# Patient Record
Sex: Female | Born: 1940 | Race: White | Hispanic: No | Smoking: Former smoker
Health system: Southern US, Community
[De-identification: ages and names within clinical notes are randomized; demographics above are authoritative.]

## PROBLEM LIST (undated history)

## (undated) DIAGNOSIS — I10 Essential (primary) hypertension: Secondary | ICD-10-CM

## (undated) DIAGNOSIS — F419 Anxiety disorder, unspecified: Secondary | ICD-10-CM

## (undated) DIAGNOSIS — A159 Respiratory tuberculosis unspecified: Secondary | ICD-10-CM

## (undated) DIAGNOSIS — Z9889 Other specified postprocedural states: Secondary | ICD-10-CM

## (undated) DIAGNOSIS — R112 Nausea with vomiting, unspecified: Secondary | ICD-10-CM

## (undated) DIAGNOSIS — J449 Chronic obstructive pulmonary disease, unspecified: Secondary | ICD-10-CM

## (undated) DIAGNOSIS — F329 Major depressive disorder, single episode, unspecified: Secondary | ICD-10-CM

## (undated) DIAGNOSIS — F32A Depression, unspecified: Secondary | ICD-10-CM

## (undated) DIAGNOSIS — R0600 Dyspnea, unspecified: Secondary | ICD-10-CM

## (undated) DIAGNOSIS — C801 Malignant (primary) neoplasm, unspecified: Secondary | ICD-10-CM

## (undated) DIAGNOSIS — E119 Type 2 diabetes mellitus without complications: Secondary | ICD-10-CM

## (undated) HISTORY — DX: Essential (primary) hypertension: I10

## (undated) HISTORY — DX: Type 2 diabetes mellitus without complications: E11.9

## (undated) HISTORY — DX: Depression, unspecified: F32.A

## (undated) HISTORY — DX: Anxiety disorder, unspecified: F41.9

## (undated) HISTORY — DX: Major depressive disorder, single episode, unspecified: F32.9

## (undated) HISTORY — PX: BREAST SURGERY: SHX581

---

## 2012-08-26 ENCOUNTER — Other Ambulatory Visit: Payer: Self-pay | Admitting: Family Medicine

## 2012-09-08 ENCOUNTER — Ambulatory Visit
Admission: RE | Admit: 2012-09-08 | Discharge: 2012-09-08 | Disposition: A | Discharge status: Home / Self Care | Payer: Medicare HMO | Source: Ambulatory Visit | Attending: Family Medicine | Admitting: Family Medicine

## 2012-09-09 ENCOUNTER — Other Ambulatory Visit: Payer: Self-pay | Admitting: Family Medicine

## 2013-09-09 ENCOUNTER — Other Ambulatory Visit: Payer: Self-pay | Admitting: Family Medicine

## 2013-09-09 DIAGNOSIS — E049 Nontoxic goiter, unspecified: Secondary | ICD-10-CM

## 2013-09-24 ENCOUNTER — Ambulatory Visit
Admission: RE | Admit: 2013-09-24 | Discharge: 2013-09-24 | Disposition: A | Discharge status: Home / Self Care | Payer: Commercial Managed Care - HMO | Source: Ambulatory Visit | Attending: Family Medicine | Admitting: Family Medicine

## 2013-09-24 ENCOUNTER — Encounter (INDEPENDENT_AMBULATORY_CARE_PROVIDER_SITE_OTHER): Payer: Self-pay

## 2013-09-24 DIAGNOSIS — E049 Nontoxic goiter, unspecified: Secondary | ICD-10-CM

## 2014-04-19 DIAGNOSIS — E02 Subclinical iodine-deficiency hypothyroidism: Secondary | ICD-10-CM | POA: Diagnosis not present

## 2014-04-19 DIAGNOSIS — Z79899 Other long term (current) drug therapy: Secondary | ICD-10-CM | POA: Diagnosis not present

## 2014-04-19 DIAGNOSIS — E785 Hyperlipidemia, unspecified: Secondary | ICD-10-CM | POA: Diagnosis not present

## 2014-04-19 DIAGNOSIS — I10 Essential (primary) hypertension: Secondary | ICD-10-CM | POA: Diagnosis not present

## 2014-04-19 DIAGNOSIS — N183 Chronic kidney disease, stage 3 (moderate): Secondary | ICD-10-CM | POA: Diagnosis not present

## 2014-04-19 DIAGNOSIS — F39 Unspecified mood [affective] disorder: Secondary | ICD-10-CM | POA: Diagnosis not present

## 2014-04-19 DIAGNOSIS — E1122 Type 2 diabetes mellitus with diabetic chronic kidney disease: Secondary | ICD-10-CM | POA: Diagnosis not present

## 2014-05-12 DIAGNOSIS — G2401 Drug induced subacute dyskinesia: Secondary | ICD-10-CM | POA: Diagnosis not present

## 2014-05-12 DIAGNOSIS — F39 Unspecified mood [affective] disorder: Secondary | ICD-10-CM | POA: Diagnosis not present

## 2014-12-01 DIAGNOSIS — I10 Essential (primary) hypertension: Secondary | ICD-10-CM | POA: Diagnosis not present

## 2014-12-01 DIAGNOSIS — N183 Chronic kidney disease, stage 3 (moderate): Secondary | ICD-10-CM | POA: Diagnosis not present

## 2014-12-01 DIAGNOSIS — K7581 Nonalcoholic steatohepatitis (NASH): Secondary | ICD-10-CM | POA: Diagnosis not present

## 2014-12-01 DIAGNOSIS — D72829 Elevated white blood cell count, unspecified: Secondary | ICD-10-CM | POA: Diagnosis not present

## 2014-12-01 DIAGNOSIS — Z0000 Encounter for general adult medical examination without abnormal findings: Secondary | ICD-10-CM | POA: Diagnosis not present

## 2014-12-01 DIAGNOSIS — E1122 Type 2 diabetes mellitus with diabetic chronic kidney disease: Secondary | ICD-10-CM | POA: Diagnosis not present

## 2014-12-01 DIAGNOSIS — E559 Vitamin D deficiency, unspecified: Secondary | ICD-10-CM | POA: Diagnosis not present

## 2014-12-01 DIAGNOSIS — E785 Hyperlipidemia, unspecified: Secondary | ICD-10-CM | POA: Diagnosis not present

## 2015-02-16 DIAGNOSIS — E1122 Type 2 diabetes mellitus with diabetic chronic kidney disease: Secondary | ICD-10-CM | POA: Diagnosis not present

## 2015-02-16 DIAGNOSIS — E785 Hyperlipidemia, unspecified: Secondary | ICD-10-CM | POA: Diagnosis not present

## 2015-02-16 DIAGNOSIS — R718 Other abnormality of red blood cells: Secondary | ICD-10-CM | POA: Diagnosis not present

## 2015-02-16 DIAGNOSIS — N181 Chronic kidney disease, stage 1: Secondary | ICD-10-CM | POA: Diagnosis not present

## 2015-02-16 DIAGNOSIS — D72829 Elevated white blood cell count, unspecified: Secondary | ICD-10-CM | POA: Diagnosis not present

## 2015-05-19 DIAGNOSIS — G629 Polyneuropathy, unspecified: Secondary | ICD-10-CM | POA: Diagnosis not present

## 2015-05-19 DIAGNOSIS — I10 Essential (primary) hypertension: Secondary | ICD-10-CM | POA: Diagnosis not present

## 2015-05-19 DIAGNOSIS — E1122 Type 2 diabetes mellitus with diabetic chronic kidney disease: Secondary | ICD-10-CM | POA: Diagnosis not present

## 2015-05-19 DIAGNOSIS — R202 Paresthesia of skin: Secondary | ICD-10-CM | POA: Diagnosis not present

## 2015-05-19 DIAGNOSIS — F39 Unspecified mood [affective] disorder: Secondary | ICD-10-CM | POA: Diagnosis not present

## 2015-05-19 DIAGNOSIS — D72829 Elevated white blood cell count, unspecified: Secondary | ICD-10-CM | POA: Diagnosis not present

## 2015-05-24 ENCOUNTER — Telehealth: Payer: Self-pay | Admitting: Oncology

## 2015-05-24 NOTE — Telephone Encounter (Signed)
Pt decline scheduling an appt at this time. Contacted referring office([facility]) and informed them of pts decision.

## 2015-05-31 DIAGNOSIS — H2513 Age-related nuclear cataract, bilateral: Secondary | ICD-10-CM | POA: Diagnosis not present

## 2015-05-31 DIAGNOSIS — H40033 Anatomical narrow angle, bilateral: Secondary | ICD-10-CM | POA: Diagnosis not present

## 2015-06-08 ENCOUNTER — Other Ambulatory Visit: Payer: Self-pay

## 2015-06-08 NOTE — Patient Outreach (Signed)
[place] [facility]) Care Management  06/08/2015  SIMORA MONTEON Oct 30, 1940 [id number]    RN CM placed phone call to Dr. Myer Peer office. Spoke with Rometta Emery. Advised her that RN CM had reached out to patient to offer [facility] services but patient had adamantly declined. Staff will make MD aware.   Plan: RN CM will close case at this time.  Enzo Montgomery, RN,BSN,CCM [place] Management Telephonic Care Management Coordinator Direct Phone: [phone] Toll Free: [phone] Fax: [phone]

## 2015-06-08 NOTE — Patient Outreach (Signed)
[place] [facility]) Care Management  06/08/2015  Laurie Collins [birth year] [id number]   Telephone Screen  Referral Date: 06/08/15 Referral Source: MD office(Dr. Stephanie Acre) Referral Reason: "COPD,DM, elevated WBC count, lymphoma(possible)"   Outreach attempt #1 to patient. Patient reached. Discussed MD referral and [facility] services. Patients states "I don't know why she(MD) placed a referral for me, I don't need any help." Discussed with patient how she might benefit from [facility] services but she was adamant that she did not need services. RN CM questioned patient in regards to her recent lab work. She states that she is not concerned about elevated WBC count. She reports she had a family relative who had elevated WBC and it "turned out to be nothing." She is not interested in seeing specialist at this time. Patient states she has "specialist appointment on 07/25/15." When patient questioned in regards to whom appt was with she reported Dr. Delrae Rend. Advised patient that this was a diabetes specialist and that he would not be the one handling her WBC count matter. She then stated that she was fine with "waiting until later" to deal with the matter. Patient repeated several times in conversation that she did not need nor was she interested in [facility] services. She did agree to [employer] CM mailing out [facility] brochure and contact info for future reference.   Plan: RN CM will notify [facility] administrative assistant of case closure. RN CM will contact MD via phone and letter to advise of patient refusal. RN CM will send [facility] brochure and contact info for patient to refer to in the future.  Enzo Montgomery, RN,BSN,CCM [place] Management Telephonic Care Management Coordinator Direct Phone: [phone] Toll Free: [phone] Fax: [phone]

## 2015-06-19 ENCOUNTER — Telehealth: Payer: Self-pay | Admitting: Oncology

## 2015-06-19 ENCOUNTER — Encounter: Payer: Self-pay | Admitting: Oncology

## 2015-06-19 NOTE — Telephone Encounter (Signed)
Verified address and insurance, faxed letter to referring provider(Dr. Dannial Monarch), mailed out new pt packet, set up return call and contact person

## 2015-06-27 ENCOUNTER — Encounter: Payer: Self-pay | Admitting: Oncology

## 2015-06-27 ENCOUNTER — Telehealth: Payer: Self-pay | Admitting: Oncology

## 2015-06-27 ENCOUNTER — Ambulatory Visit (HOSPITAL_BASED_OUTPATIENT_CLINIC_OR_DEPARTMENT_OTHER): Payer: Commercial Managed Care - HMO

## 2015-06-27 ENCOUNTER — Ambulatory Visit (HOSPITAL_BASED_OUTPATIENT_CLINIC_OR_DEPARTMENT_OTHER): Payer: Commercial Managed Care - HMO | Admitting: Oncology

## 2015-06-27 VITALS — BP 169/69 | HR 99 | Temp 98.6°F | Resp 18 | Ht 63.0 in | Wt 184.9 lb

## 2015-06-27 DIAGNOSIS — D72829 Elevated white blood cell count, unspecified: Secondary | ICD-10-CM

## 2015-06-27 DIAGNOSIS — R718 Other abnormality of red blood cells: Secondary | ICD-10-CM

## 2015-06-27 LAB — CBC WITH DIFFERENTIAL/PLATELET
BASO%: 0.6 % (ref 0.0–2.0)
BASOS ABS: 0.1 10*3/uL (ref 0.0–0.1)
EOS ABS: 0.5 10*3/uL (ref 0.0–0.5)
EOS%: 3.5 % (ref 0.0–7.0)
HCT: 40 % (ref 34.8–46.6)
HGB: 12.6 g/dL (ref 11.6–15.9)
LYMPH%: 30.2 % (ref 14.0–49.7)
MCH: 23.8 pg — AB (ref 25.1–34.0)
MCHC: 31.4 g/dL — AB (ref 31.5–36.0)
MCV: 75.7 fL — AB (ref 79.5–101.0)
MONO#: 1.2 10*3/uL — AB (ref 0.1–0.9)
MONO%: 8.9 % (ref 0.0–14.0)
NEUT#: 7.9 10*3/uL — ABNORMAL HIGH (ref 1.5–6.5)
NEUT%: 56.8 % (ref 38.4–76.8)
PLATELETS: 330 10*3/uL (ref 145–400)
RBC: 5.29 10*6/uL (ref 3.70–5.45)
RDW: 16.9 % — ABNORMAL HIGH (ref 11.2–14.5)
WBC: 14 10*3/uL — AB (ref 3.9–10.3)
lymph#: 4.2 10*3/uL — ABNORMAL HIGH (ref 0.9–3.3)

## 2015-06-27 LAB — COMPREHENSIVE METABOLIC PANEL
ALT: 50 U/L (ref 0–55)
AST: 44 U/L — ABNORMAL HIGH (ref 5–34)
Albumin: 3.9 g/dL (ref 3.5–5.0)
Alkaline Phosphatase: 92 U/L (ref 40–150)
Anion Gap: 11 mEq/L (ref 3–11)
BILIRUBIN TOTAL: 0.35 mg/dL (ref 0.20–1.20)
BUN: 20.2 mg/dL (ref 7.0–26.0)
CHLORIDE: 104 meq/L (ref 98–109)
CO2: 26 meq/L (ref 22–29)
Calcium: 10 mg/dL (ref 8.4–10.4)
Creatinine: 1.1 mg/dL (ref 0.6–1.1)
EGFR: 50 mL/min/{1.73_m2} — AB (ref >90)
GLUCOSE: 153 mg/dL — AB (ref 70–140)
POTASSIUM: 4.1 meq/L (ref 3.5–5.1)
SODIUM: 140 meq/L (ref 136–145)
TOTAL PROTEIN: 8.7 g/dL — AB (ref 6.4–8.3)

## 2015-06-27 LAB — IRON AND TIBC
%SAT: 11 % — AB (ref 21–57)
Iron: 43 ug/dL (ref 41–142)
TIBC: 386 ug/dL (ref 236–444)
UIBC: 343 ug/dL (ref 120–384)

## 2015-06-27 LAB — FERRITIN: Ferritin: 45 ng/ml (ref 9–269)

## 2015-06-27 NOTE — Progress Notes (Signed)
Please see consult note.  

## 2015-06-27 NOTE — Telephone Encounter (Signed)
per pof to sch pt appt-sent back ot lab

## 2015-06-27 NOTE — Consult Note (Signed)
Reason for Referral:   HPI: 75 year old woman native of [place] and currently lives in this area with her son for the last 4 years. She is a woman with history of mild obesity, hypertension and diabetes. She also has a long-standing smoking history for over 30 years. She did quit however in the last few years. She was noted to have a leukocytosis periodically over the last few years. Her most recent CBC and February 2017 showed a white cell count 13.2 with a normal hemoglobin of 12.2, MCV 76 and platelet count of 320. Her differential was completely normal. Her white cell count in November 2016 was 14.1 with a normal differential. Her white cell count was high as 17.2 in August 2016. Her white cell count was 16,000 back in April 2014. She is completely asymptomatic and does not report any recent infections. She denied any sinusitis, cellulitis or urinary tract infection. She denied any chronic inflammatory conditions but does report osteoarthritis. She does not report any recent illnesses or hospitalizations. She does report a cousin that was diagnosed with myeloproliferative disorder with essential thrombocythemia. She continues to be functional and able to perform all activities of daily living. She does take vitamin D supplements but have not taken any iron in the past. She never had a colonoscopy.  She does not report any headaches, blurry vision, syncope or seizures. He does not report any fevers, chills, sweats or weight loss. Does not report any chest pain, palpitation, orthopnea or leg edema. She does not report cough, wheezing or hemoptysis. She does not report any nausea, vomiting, abdominal pain, hematochezia, melena or change in her bowel habits. She does not report any frequency, urgency or hesitancy. She does not report any skeletal complaints. Remaining review of systems unremarkable.   Past Medical History  Diagnosis Date  . Diabetes mellitus without complication ([place])   .  Hypertension   : Her medication were personally reviewed including vitamin D, amlodipine, hydrochlorothiazide, lisinopril, metoprolol, simvastatin, at [facility].    Allergic to doxycycline and penicillin    family history: Her mother died of gastric cancer but no history of any blood disorders or hematological malignancies.   Social History: She is single and this time. She denied alcohol abuse. She smoked heavily for over 30 years but currently not smoking.     Pertinent items are noted in HPI.  Exam: ECOG 0 Blood pressure 169/69, pulse 99, temperature 98.6 F (37 C), temperature source Oral, resp. rate 18, height 5\' 3"  (1.6 m), weight 184 lb 14.4 oz (83.87 kg), SpO2 99 %. General appearance: alert and cooperative appeared in no active distress. Head: Normocephalic, without obvious abnormality sclerae anicteric. Throat: lips, mucosa, and tongue normal; teeth and gums normal Neck: no adenopathy Back: negative Resp: clear to auscultation bilaterally without wheezes or dullness to percussion. Chest wall: no tenderness Cardio: regular rate and rhythm, S1, S2 normal, no murmur, click, rub or gallop GI: soft, non-tender; bowel sounds normal; no masses,  no organomegaly Extremities: extremities normal, atraumatic, no cyanosis or edema Pulses: 2+ and symmetric Skin: Skin color, texture, turgor normal. No rashes or lesions Lymph nodes: Cervical, supraclavicular, and axillary nodes normal.   Laboratory data reviewed in the history of present illness.    Assessment and Plan:   75 year old woman with the following issues:  1. 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 Her white cell count have fluctuated as high as 17,000 and back to normal range during the  interval of time. Her differential have been completely normal with normal neutrophil percentage, lymphocyte proportions as well as no increased basophilia, eosinophilia or monocytosis. She also have  been told in the past that she had leukocytosis even back when she was living in [place].  The differential diagnosis was discussed today with the patient and her friends accompanied her today. Most likely we are dealing with a reactive leukocytosis of a benign etiology. Given the fluctuating nature of her white cell count, normalization at times as well as lack of any other symptoms diagnosis of a malignant disorder is likely.  Myeloproliferative disorder such as CML, polycythemia, essential thrombocythemia or consideration but considered to be less likely.  Smoking related leukocytosis could be a possibility but she have quit in the last 4 years or so. Leukocytosis related to a malignancy is also unlikely given the fact that she is completely asymptomatic and normalization of her white cell count at times.  From a management standpoint, I will obtain a BCR-ABL to rule out CML and also JAK2 mutation to rule out myeloproliferative disorder. If these testing are negative, I do not recommend any further evaluation and likely we are dealing with a benign process.  If these tests are positive, then further evaluation might be needed.  2. Microcytosis and elevated RDW: Prescription be related to chronic iron deficiency which needs to be evaluated in a postmenopausal woman. I have recommended obtaining iron studies which we will do today. I do recommend screening colonoscopy in any case as she is certainly due for that.  3. Age-appropriate cancer screening: I have recommended a colonoscopy to complete cancer screening.  All her questions were answered and follow-up will be determined by the results of her laboratory testing. I have emphasized importance of obtaining a colonoscopy which she is thinking about and we'll discuss with her primary care provider in their next visit.

## 2015-07-04 ENCOUNTER — Encounter: Payer: Self-pay | Admitting: Oncology

## 2015-07-04 NOTE — Progress Notes (Signed)
The results of her workup will obtain on 06/27/2015 was reviewed today and discussed with [name] over the phone. Her CBC showed a white cell count of 14,000, hemoglobin of 12 and an MCV of 75. Her iron studies showed an iron level of 43 and saturation of 11%. Her ferritin is normal.  Her BCR-ABL screen and JAK 2 mutation showed normal results without any evidence of these findings. This supports the finding of a reactive leukocytosis rather than a myeloproliferative disorder.  I discussed these findings today with the patient and I have recommended oral iron supplements over-the-counter as well as consideration for a colonoscopy given her mild iron deficiency.  No further hematology workup is needed at this time and I will be happy to see her in the future as needed.  Results for Laurie Collins, Laurie Collins (MRN [id number]) as of 07/04/2015 09:47  Ref. Range 06/27/2015 11:09 06/27/2015 11:09  Sodium Latest Ref Range: 136-145 mEq/L  140  Potassium Latest Ref Range: 3.5-5.1 mEq/L  4.1  Chloride Latest Ref Range: 98-109 mEq/L  104  CO2 Latest Ref Range: 22-29 mEq/L  26  BUN Latest Ref Range: 7.0-26.0 mg/dL  20.2  Creatinine Latest Ref Range: 0.6-1.1 mg/dL  1.1  Calcium Latest Ref Range: 8.4-10.4 mg/dL  10.0  Glucose Latest Ref Range: 70-140 mg/dl  153 (H)  Anion gap Latest Ref Range: 3-11 mEq/L  11  EGFR Latest Ref Range: >90 ml/min/1.73 m2  50 (L)  Alkaline Phosphatase Latest Ref Range: 40-150 U/L  92  Albumin Latest Ref Range: 3.5-5.0 g/dL  3.9  AST Latest Ref Range: 5-34 U/L  44 (H)  ALT Latest Ref Range: 0-55 U/L  50  Total Protein Latest Ref Range: 6.4-8.3 g/dL  8.7 (H)  Total Bilirubin Latest Ref Range: 0.20-1.20 mg/dL  0.35  Iron Latest Ref Range: 41-142 ug/dL 43   UIBC Latest Ref Range: 120-384 ug/dL 343   TIBC Latest Ref Range: 236-444 ug/dL 386   %SAT Latest Ref Range: 21-57 % 11 (L)   Ferritin Latest Ref Range: 9-269 ng/ml 45   WBC Latest Ref Range: 3.9-10.3 10e3/uL 14.0 (H)   RBC  Latest Ref Range: 3.70-5.45 10e6/uL 5.29   Hemoglobin Latest Ref Range: 11.6-15.9 g/dL 12.6   HCT Latest Ref Range: 34.8-46.6 % 40.0   MCV Latest Ref Range: 79.5-101.0 fL 75.7 (L)   MCH Latest Ref Range: 25.1-34.0 pg 23.8 (L)   MCHC Latest Ref Range: 31.5-36.0 g/dL 31.4 (L)   RDW Latest Ref Range: 11.2-14.5 % 16.9 (H)   Platelets Latest Ref Range: 145-400 10e3/uL 330   NEUT% Latest Ref Range: 38.4-76.8 % 56.8   LYMPH% Latest Ref Range: 14.0-49.7 % 30.2   MONO% Latest Ref Range: 0.0-14.0 % 8.9   EOS% Latest Ref Range: 0.0-7.0 % 3.5   BASO% Latest Ref Range: 0.0-2.0 % 0.6   NEUT# Latest Ref Range: 1.5-6.5 10e3/uL 7.9 (H)   MONO# Latest Ref Range: 0.1-0.9 10e3/uL 1.2 (H)   Eosinophils Absolute Latest Ref Range: 0.0-0.5 10e3/uL 0.5   Basophils Absolute Latest Ref Range: 0.0-0.1 10e3/uL 0.1   lymph# Latest Ref Range: 0.9-3.3 10e3/uL 4.2 (H)

## 2015-07-25 DIAGNOSIS — E1165 Type 2 diabetes mellitus with hyperglycemia: Secondary | ICD-10-CM | POA: Diagnosis not present

## 2015-07-25 DIAGNOSIS — Z7984 Long term (current) use of oral hypoglycemic drugs: Secondary | ICD-10-CM | POA: Diagnosis not present

## 2015-07-25 DIAGNOSIS — E669 Obesity, unspecified: Secondary | ICD-10-CM | POA: Diagnosis not present

## 2015-07-25 DIAGNOSIS — Z6834 Body mass index (BMI) 34.0-34.9, adult: Secondary | ICD-10-CM | POA: Diagnosis not present

## 2015-10-25 DIAGNOSIS — E119 Type 2 diabetes mellitus without complications: Secondary | ICD-10-CM | POA: Diagnosis not present

## 2015-10-25 DIAGNOSIS — Z7984 Long term (current) use of oral hypoglycemic drugs: Secondary | ICD-10-CM | POA: Diagnosis not present

## 2015-10-25 DIAGNOSIS — E669 Obesity, unspecified: Secondary | ICD-10-CM | POA: Diagnosis not present

## 2015-10-25 DIAGNOSIS — Z6834 Body mass index (BMI) 34.0-34.9, adult: Secondary | ICD-10-CM | POA: Diagnosis not present

## 2015-12-22 DIAGNOSIS — E1165 Type 2 diabetes mellitus with hyperglycemia: Secondary | ICD-10-CM | POA: Diagnosis not present

## 2015-12-22 DIAGNOSIS — E02 Subclinical iodine-deficiency hypothyroidism: Secondary | ICD-10-CM | POA: Diagnosis not present

## 2015-12-22 DIAGNOSIS — N183 Chronic kidney disease, stage 3 (moderate): Secondary | ICD-10-CM | POA: Diagnosis not present

## 2015-12-22 DIAGNOSIS — E785 Hyperlipidemia, unspecified: Secondary | ICD-10-CM | POA: Diagnosis not present

## 2015-12-22 DIAGNOSIS — E559 Vitamin D deficiency, unspecified: Secondary | ICD-10-CM | POA: Diagnosis not present

## 2015-12-22 DIAGNOSIS — Z0000 Encounter for general adult medical examination without abnormal findings: Secondary | ICD-10-CM | POA: Diagnosis not present

## 2015-12-22 DIAGNOSIS — Z79899 Other long term (current) drug therapy: Secondary | ICD-10-CM | POA: Diagnosis not present

## 2015-12-22 DIAGNOSIS — Z7984 Long term (current) use of oral hypoglycemic drugs: Secondary | ICD-10-CM | POA: Diagnosis not present

## 2015-12-22 DIAGNOSIS — D72829 Elevated white blood cell count, unspecified: Secondary | ICD-10-CM | POA: Diagnosis not present

## 2015-12-22 DIAGNOSIS — K7581 Nonalcoholic steatohepatitis (NASH): Secondary | ICD-10-CM | POA: Diagnosis not present

## 2016-08-20 ENCOUNTER — Other Ambulatory Visit: Payer: Self-pay | Admitting: Family Medicine

## 2016-08-20 DIAGNOSIS — N63 Unspecified lump in unspecified breast: Secondary | ICD-10-CM | POA: Diagnosis not present

## 2016-08-20 DIAGNOSIS — R05 Cough: Secondary | ICD-10-CM | POA: Diagnosis not present

## 2016-08-22 ENCOUNTER — Other Ambulatory Visit: Payer: Self-pay | Admitting: Family Medicine

## 2016-08-22 DIAGNOSIS — N63 Unspecified lump in unspecified breast: Secondary | ICD-10-CM

## 2016-08-28 ENCOUNTER — Ambulatory Visit
Admission: RE | Admit: 2016-08-28 | Discharge: 2016-08-28 | Disposition: A | Discharge status: Home / Self Care | Payer: Medicare HMO | Source: Ambulatory Visit | Attending: Family Medicine | Admitting: Family Medicine

## 2016-08-28 ENCOUNTER — Other Ambulatory Visit: Payer: Self-pay | Admitting: Family Medicine

## 2016-08-28 DIAGNOSIS — N63 Unspecified lump in unspecified breast: Secondary | ICD-10-CM

## 2016-08-28 DIAGNOSIS — R928 Other abnormal and inconclusive findings on diagnostic imaging of breast: Secondary | ICD-10-CM | POA: Diagnosis not present

## 2016-08-28 DIAGNOSIS — N6312 Unspecified lump in the right breast, upper inner quadrant: Secondary | ICD-10-CM | POA: Diagnosis not present

## 2016-08-28 DIAGNOSIS — N6321 Unspecified lump in the left breast, upper outer quadrant: Secondary | ICD-10-CM | POA: Diagnosis not present

## 2016-08-28 DIAGNOSIS — R599 Enlarged lymph nodes, unspecified: Secondary | ICD-10-CM

## 2016-09-02 ENCOUNTER — Ambulatory Visit
Admission: RE | Admit: 2016-09-02 | Discharge: 2016-09-02 | Disposition: A | Discharge status: Home / Self Care | Payer: Medicare HMO | Source: Ambulatory Visit | Attending: Family Medicine | Admitting: Family Medicine

## 2016-09-02 DIAGNOSIS — C773 Secondary and unspecified malignant neoplasm of axilla and upper limb lymph nodes: Secondary | ICD-10-CM | POA: Diagnosis not present

## 2016-09-02 DIAGNOSIS — N63 Unspecified lump in unspecified breast: Secondary | ICD-10-CM

## 2016-09-02 DIAGNOSIS — C50212 Malignant neoplasm of upper-inner quadrant of left female breast: Secondary | ICD-10-CM | POA: Diagnosis not present

## 2016-09-02 DIAGNOSIS — C50211 Malignant neoplasm of upper-inner quadrant of right female breast: Secondary | ICD-10-CM | POA: Diagnosis not present

## 2016-09-02 DIAGNOSIS — N6312 Unspecified lump in the right breast, upper inner quadrant: Secondary | ICD-10-CM | POA: Diagnosis not present

## 2016-09-02 DIAGNOSIS — R590 Localized enlarged lymph nodes: Secondary | ICD-10-CM | POA: Diagnosis not present

## 2016-09-02 DIAGNOSIS — Z171 Estrogen receptor negative status [ER-]: Secondary | ICD-10-CM | POA: Diagnosis not present

## 2016-09-02 DIAGNOSIS — N6322 Unspecified lump in the left breast, upper inner quadrant: Secondary | ICD-10-CM | POA: Diagnosis not present

## 2016-09-02 DIAGNOSIS — R599 Enlarged lymph nodes, unspecified: Secondary | ICD-10-CM

## 2016-09-05 ENCOUNTER — Telehealth: Payer: Self-pay | Admitting: *Deleted

## 2016-09-05 ENCOUNTER — Other Ambulatory Visit: Payer: Self-pay | Admitting: *Deleted

## 2016-09-05 ENCOUNTER — Encounter: Payer: Self-pay | Admitting: *Deleted

## 2016-09-05 DIAGNOSIS — C50212 Malignant neoplasm of upper-inner quadrant of left female breast: Secondary | ICD-10-CM

## 2016-09-05 DIAGNOSIS — Z171 Estrogen receptor negative status [ER-]: Secondary | ICD-10-CM

## 2016-09-05 DIAGNOSIS — Z170 Estrogen receptor positive status [ER+]: Secondary | ICD-10-CM

## 2016-09-05 DIAGNOSIS — C50211 Malignant neoplasm of upper-inner quadrant of right female breast: Secondary | ICD-10-CM | POA: Insufficient documentation

## 2016-09-05 NOTE — Telephone Encounter (Signed)
Confirmed [facility] for 09/11/16 at 0815 .  Instructions and contact information given.  

## 2016-09-11 ENCOUNTER — Other Ambulatory Visit: Payer: Self-pay | Admitting: *Deleted

## 2016-09-11 ENCOUNTER — Encounter: Payer: Self-pay | Admitting: Physical Therapy

## 2016-09-11 ENCOUNTER — Encounter: Payer: Self-pay | Admitting: Hematology and Oncology

## 2016-09-11 ENCOUNTER — Ambulatory Visit (HOSPITAL_BASED_OUTPATIENT_CLINIC_OR_DEPARTMENT_OTHER): Payer: Medicare HMO | Admitting: Hematology and Oncology

## 2016-09-11 ENCOUNTER — Encounter: Payer: Self-pay | Admitting: *Deleted

## 2016-09-11 ENCOUNTER — Ambulatory Visit
Admission: RE | Admit: 2016-09-11 | Discharge: 2016-09-11 | Disposition: A | Discharge status: Home / Self Care | Payer: Medicare HMO | Source: Ambulatory Visit | Attending: Radiation Oncology | Admitting: Radiation Oncology

## 2016-09-11 ENCOUNTER — Other Ambulatory Visit (HOSPITAL_BASED_OUTPATIENT_CLINIC_OR_DEPARTMENT_OTHER): Payer: Medicare HMO

## 2016-09-11 ENCOUNTER — Ambulatory Visit: Payer: Medicare HMO | Attending: General Surgery | Admitting: Physical Therapy

## 2016-09-11 ENCOUNTER — Other Ambulatory Visit: Payer: Self-pay | Admitting: General Surgery

## 2016-09-11 VITALS — BP 174/72 | HR 99 | Temp 97.6°F | Resp 17 | Ht 63.0 in | Wt 190.5 lb

## 2016-09-11 DIAGNOSIS — C50212 Malignant neoplasm of upper-inner quadrant of left female breast: Secondary | ICD-10-CM

## 2016-09-11 DIAGNOSIS — M25611 Stiffness of right shoulder, not elsewhere classified: Secondary | ICD-10-CM

## 2016-09-11 DIAGNOSIS — R262 Difficulty in walking, not elsewhere classified: Secondary | ICD-10-CM | POA: Diagnosis not present

## 2016-09-11 DIAGNOSIS — C50211 Malignant neoplasm of upper-inner quadrant of right female breast: Secondary | ICD-10-CM | POA: Diagnosis not present

## 2016-09-11 DIAGNOSIS — C773 Secondary and unspecified malignant neoplasm of axilla and upper limb lymph nodes: Secondary | ICD-10-CM | POA: Diagnosis not present

## 2016-09-11 DIAGNOSIS — C50411 Malignant neoplasm of upper-outer quadrant of right female breast: Secondary | ICD-10-CM | POA: Diagnosis not present

## 2016-09-11 DIAGNOSIS — Z171 Estrogen receptor negative status [ER-]: Principal | ICD-10-CM

## 2016-09-11 DIAGNOSIS — M25612 Stiffness of left shoulder, not elsewhere classified: Secondary | ICD-10-CM | POA: Diagnosis not present

## 2016-09-11 DIAGNOSIS — Z170 Estrogen receptor positive status [ER+]: Secondary | ICD-10-CM | POA: Diagnosis not present

## 2016-09-11 DIAGNOSIS — R293 Abnormal posture: Secondary | ICD-10-CM | POA: Diagnosis not present

## 2016-09-11 LAB — COMPREHENSIVE METABOLIC PANEL
ALK PHOS: 88 U/L (ref 40–150)
ALT: 63 U/L — ABNORMAL HIGH (ref 0–55)
ANION GAP: 11 meq/L (ref 3–11)
AST: 69 U/L — ABNORMAL HIGH (ref 5–34)
Albumin: 3.5 g/dL (ref 3.5–5.0)
BILIRUBIN TOTAL: 0.3 mg/dL (ref 0.20–1.20)
BUN: 21.2 mg/dL (ref 7.0–26.0)
CO2: 27 meq/L (ref 22–29)
Calcium: 10 mg/dL (ref 8.4–10.4)
Chloride: 101 mEq/L (ref 98–109)
Creatinine: 0.9 mg/dL (ref 0.6–1.1)
EGFR: 61 mL/min/{1.73_m2} — AB (ref >90)
Glucose: 114 mg/dl (ref 70–140)
POTASSIUM: 4.3 meq/L (ref 3.5–5.1)
SODIUM: 139 meq/L (ref 136–145)
TOTAL PROTEIN: 8.8 g/dL — AB (ref 6.4–8.3)

## 2016-09-11 LAB — CBC WITH DIFFERENTIAL/PLATELET
BASO%: 0.2 % (ref 0.0–2.0)
BASOS ABS: 0 10*3/uL (ref 0.0–0.1)
EOS ABS: 0.3 10*3/uL (ref 0.0–0.5)
EOS%: 2.5 % (ref 0.0–7.0)
HCT: 34.1 % — ABNORMAL LOW (ref 34.8–46.6)
HGB: 10.3 g/dL — ABNORMAL LOW (ref 11.6–15.9)
LYMPH%: 29.4 % (ref 14.0–49.7)
MCH: 21.9 pg — AB (ref 25.1–34.0)
MCHC: 30.2 g/dL — ABNORMAL LOW (ref 31.5–36.0)
MCV: 72.4 fL — AB (ref 79.5–101.0)
MONO#: 0.9 10*3/uL (ref 0.1–0.9)
MONO%: 7.3 % (ref 0.0–14.0)
NEUT%: 60.6 % (ref 38.4–76.8)
NEUTROS ABS: 7.7 10*3/uL — AB (ref 1.5–6.5)
Platelets: 438 10*3/uL — ABNORMAL HIGH (ref 145–400)
RBC: 4.71 10*6/uL (ref 3.70–5.45)
RDW: 18.5 % — ABNORMAL HIGH (ref 11.2–14.5)
WBC: 12.6 10*3/uL — AB (ref 3.9–10.3)
lymph#: 3.7 10*3/uL — ABNORMAL HIGH (ref 0.9–3.3)

## 2016-09-11 MED ORDER — LORAZEPAM 1 MG PO TABS: 1.0000 mg | ORAL_TABLET | Freq: Every day | ORAL | 0 refills | Status: DC

## 2016-09-11 MED ORDER — PROCHLORPERAZINE MALEATE 10 MG PO TABS
10.0000 mg | ORAL_TABLET | Freq: Four times a day (QID) | ORAL | 1 refills | Status: DC | PRN
Start: 2016-09-11 — End: 2016-12-25

## 2016-09-11 MED ORDER — LIDOCAINE-PRILOCAINE 2.5-2.5 % EX CREA: TOPICAL_CREAM | CUTANEOUS | 3 refills | Status: DC

## 2016-09-11 MED ORDER — ONDANSETRON HCL 8 MG PO TABS: 8.0000 mg | ORAL_TABLET | Freq: Two times a day (BID) | ORAL | 1 refills | Status: DC | PRN

## 2016-09-11 NOTE — Progress Notes (Signed)
Radiation Oncology         ([phone]) [phone] ________________________________  Initial Outpatient Consultation  Name: Laurie Collins MRN: [id number]  Date: 09/11/2016  DOB: 1941-04-03  CC:[name], MD  [name], MD   REFERRING PHYSICIAN: [name], MD  DIAGNOSIS: The primary encounter diagnosis was Malignant neoplasm of upper-inner quadrant of left breast in female, estrogen receptor negative ([place]). A diagnosis of Malignant neoplasm of upper-inner quadrant of right breast in female, estrogen receptor positive ([place]) was also pertinent to this visit.  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 female who had a diagnostic mammogram and [place] on 08/28/16 for evaluation of palpable masses in the bilateral breasts. Imaging revealed a palpable mass in the left breast 11:30 position measuring 7.4 cm on mammography and 6.3 cm on [place], 3 abnormally enlarged left axillary lymph nodes, multiple suspicious masses in the superior and UIQ of the right breast, and no evidence of right axillary lymphadenopathy.  The patient had multiple biopsies on 09/02/16.  Biopsy of the left breast in the 11:30 position showed grade 3 IDC and lymphovascular invasion (ER 0% negative, PR 0% negative, HER2 negative, Ki67 90%). Biopsy of a left axillary lymph node revealed metastatic carcinoma. Biopsy of the right breast in the 12:30 position revealed grade 2 ILC (ER 90% positive, PR 30% positive, HER2 negative, Ki67 10%). Biopsy of the right breast in the 1:00 position revealed grade 2 ILC (ER 95% positive, PR 10% positive, HER2 negative, Ki67 10%).  Gynecologic History Age at first menstrual period? 13 Are you still having periods? No  Approximate date of last period? "25 years ago" If you no longer have periods: Have you used  hormone replacement? No Obstetric History:  How many children have you carried to term? 2  Your age at first live birth? 62 Pregnant now or trying to get pregnant? No Have you used birth control pills or hormone shots for contraception? No If so, for how long (or approximate dates)? N/A Health Maintenance: Have you ever had a colonoscopy? No Have you ever had a bone density? Yes Date of your last PAP smear? 2010  Date of your FIRST mammogram? "36 years ago"  The patient presents today in multidisciplinary breast clinic to discuss treatment options for the management of her disease.   On review of systems, the patient is positive for glasses, weight change, loss of sleep, fatigue affecting activities of daily life, leg pain, and muscle ache and cramping. She also reports shortness of breath at rest and during activity, dry cough, breast lump, difficulty walking, fainting, anxiety, depression, and diabetes.   PREVIOUS RADIATION THERAPY: No  PAST MEDICAL HISTORY:  has a past medical history of Anxiety; Depression; Diabetes mellitus without complication ([place]); and Hypertension.    PAST SURGICAL HISTORY:No past surgical history on file.  FAMILY HISTORY: family history is not on file.  SOCIAL HISTORY:  reports that she has quit smoking. She has never used smokeless tobacco. She reports that she does not drink alcohol or use drugs.  ALLERGIES: Penicillins  MEDICATIONS:  Current Outpatient Prescriptions  Medication Sig Dispense Refill  . amLODipine (NORVASC) 10 MG tablet Take 10 mg by mouth daily.    Marland Kitchen aspirin 81 MG chewable tablet Chew 81 mg by mouth daily.    Marland Kitchen glimepiride (AMARYL) 1 MG tablet Take 1  mg by mouth daily with breakfast.    . hydrochlorothiazide (MICROZIDE) 12.5 MG capsule Take 12.5 mg by mouth daily.    Marland Kitchen lidocaine-prilocaine (EMLA) cream Apply to affected area once 30 g 3  . lisinopril (PRINIVIL,ZESTRIL) 20 MG tablet Take 20 mg by mouth daily.    Marland Kitchen loperamide (IMODIUM)  2 MG capsule Take 2 mg by mouth as needed for diarrhea or loose stools.    Marland Kitchen LORazepam (ATIVAN) 1 MG tablet Take 1 tablet (1 mg total) by mouth at bedtime. 30 tablet 0  . metFORMIN (GLUCOPHAGE) 1000 MG tablet Take 1,000 mg by mouth 2 (two) times daily with a meal.    . Metoprolol-Hydrochlorothiazide (METOPROLOL-HCTZ ER PO) Take 100 mg by mouth daily.    . ondansetron (ZOFRAN) 8 MG tablet Take 1 tablet (8 mg total) by mouth 2 (two) times daily as needed (Nausea or vomiting). 30 tablet 1  . prochlorperazine (COMPAZINE) 10 MG tablet Take 1 tablet (10 mg total) by mouth every 6 (six) hours as needed (Nausea or vomiting). 30 tablet 1  . Vitamin D, Cholecalciferol, 1000 units CAPS Take 2,000 Units by mouth daily.     No current facility-administered medications for this encounter.     REVIEW OF SYSTEMS: A 10+ POINT REVIEW OF SYSTEMS WAS OBTAINED including neurology, dermatology, psychiatry, cardiac, respiratory, lymph, extremities, GI, GU, musculoskeletal, constitutional, reproductive, HEENT. All pertinent positives are noted in the HPI. All others are negative.  PHYSICAL EXAM:  Vitals with BMI 09/11/2016  Height 5' 3"   Weight 190 lbs 8 oz  BMI 69.4  Systolic 854  Diastolic 72  Pulse 99  Respirations 17  General: Alert and oriented, in no acute distress. HEENT: Head is normocephalic. Extraocular movements are intact. Oropharynx is clear. Neck: Neck is supple, no palpable cervical or supraclavicular lymphadenopathy. Heart: Regular in rate and rhythm with no murmurs, rubs, or gallops. Chest: Clear to auscultation bilaterally, with no rhonchi, wheezes, or rales. Abdomen: Soft, nontender, nondistended, with no rigidity or guarding. Extremities: No cyanosis or edema. Lymphatics: see Neck Exam Skin: No concerning lesions. Musculoskeletal: symmetric strength and muscle tone throughout. Neurologic: Cranial nerves II through XII are grossly intact. No obvious focalities. Speech is fluent.  Coordination is intact. Psychiatric: Judgment and insight are intact. Affect is appropriate. Breast: Left breast shows large palpable mass in the upper aspect measuring approximately 10 x 12 cm in size. This mass is visibly noticeable when sitting up. No obvious skin involvement. Patient does have some edema in nipple areolar complex. She also has a palpable mass in left axilla measuring approximately 3 x 4 cm in size. No nipple discharge or bleeding. Right breast bruising in upper aspect from biopsy. In the superior aspect of the breast, the patient has palpable mass measuring approximately 3 x 4 cm in size.  ECOG = 2  LABORATORY DATA:  Lab Results  Component Value Date   WBC 12.6 (H) 09/11/2016   HGB 10.3 (L) 09/11/2016   HCT 34.1 (L) 09/11/2016   MCV 72.4 (L) 09/11/2016   PLT 438 (H) 09/11/2016   NEUTROABS 7.7 (H) 09/11/2016   Lab Results  Component Value Date   NA 139 09/11/2016   K 4.3 09/11/2016   CO2 27 09/11/2016   GLUCOSE 114 09/11/2016   CREATININE 0.9 09/11/2016   CALCIUM 10.0 09/11/2016     RADIOGRAPHY: US Breast Ltd Uni Left Inc Axilla  Result Date: 08/28/2016 CLINICAL DATA:  75 year old female presenting for new baseline mammogram for evaluation of palpable masses  in the bilateral breasts. EXAM: 2D DIGITAL DIAGNOSTIC BILATERAL MAMMOGRAM WITH CAD AND ADJUNCT TOMO BILATERAL BREAST ULTRASOUND COMPARISON:  None. ACR Breast Density Category b: There are scattered areas of fibroglandular density. FINDINGS: A BB has been placed on the upper-outer quadrant of the left breast indicating a palpable site of concern. There is a large lobulated mass with indistinct margins deep to the palpable marker measuring approximately 7.4 cm. There is a partially visualized probable abnormal lymph node in the left axilla. The palpable marker in the right breast has been placed on the superior aspect. There is a possible spiculated mass in the tissue deep to the palpable marker. Additionally,  anterior to the palpable marker in the superior right breast, middle depth there is a suspicious spiculated mass. In the medial aspect of the right breast, middle depth there is a 1.0 cm group of calcifications which appears linearly oriented on the CC view, however it spreads out on the lateral views and does not maintain the suspicious linear distribution. 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 Mammographic images were processed with CAD. There is a large protruding palpable mass in the upper-outer quadrant of the left breast at the patient's palpable site of concern. In the superior right breast there is a subtle nodularity to the tissue in the area of concern. Ultrasound of the palpable site in the left breast at 1130, 10 cm from the nipple is a large irregular hypoechoic mass measuring 6.3 x 6.1 x 5.2 cm. Internal vascularity is identified on color Doppler imaging. Ultrasound of the left axilla demonstrates 3 abnormally enlarged lymph nodes with cortex measuring up to 1.5 cm. Ultrasound targeted to the palpable site in the superior right breast at 1 o'clock, 10 cm from the nipple demonstrates an irregular hypoechoic mass measuring 1.4 x 0.8 x 0.9 cm. At a site slightly more lateral in closer to the nipple at 12:30, 8 cm from the nipple there is a superficial area of shadowing measuring 5 x 3 x 3 mm. At 12:30, 3 cm from the nipple there is another irregular hypoechoic mass measuring 1.3 x 0.8 x 0.9 cm. In the right breast at 1 o'clock, 4 cm from the nipple there is an oval mass with indistinct somewhat angular margins measuring 8 x 7 x 7 mm. No evidence of right axillary lymphadenopathy. IMPRESSION: 1. The palpable mass in the left breast at 11:30 measures up to 7.4 cm mammographically and up to 6.3 cm by ultrasound. This is highly suspicious for malignancy. 2. There are 3 abnormally enlarged left axillary lymph  nodes identified by ultrasound. 3. There are multiple suspicious masses identified in the superior and upper inner quadrant of the right breast, 1 of which corresponds with the palpable site identified by the patient. The largest of these masses measures 1.4 cm. 4.  No evidence of right axillary lymphadenopathy. RECOMMENDATION: 1. Ultrasound-guided biopsy is recommended for 2 sites in the right breast; including the palpable site at 1 o'clock, 10 cm from the nipple, and the spiculated mass at 12:30, 3 cm from the nipple. 2. Ultrasound-guided biopsy is recommended for the palpable left breast mass at 11:30, and for 1 of the abnormal left axillary lymph nodes. These for procedures have been scheduled for05/21/2018 at 10:30 a.m. The patient is very nervous about the procedures. I feel confident I can help her get through the procedures, however given her level of anxiety she may be a good candidate for  anxiolysis. I have discussed the findings and recommendations with the patient. Results were also provided in writing at the conclusion of the visit. If applicable, a reminder letter will be sent to the patient regarding the next appointment. BI-RADS CATEGORY  5: Highly suggestive of malignancy. Electronically Signed   By: [name]   On: 08/28/2016 15:24   US Breast Ltd Uni Right Inc Axilla  Result Date: 08/28/2016 CLINICAL DATA:  76 year old female presenting for new baseline mammogram for evaluation of palpable masses in the bilateral breasts. EXAM: 2D DIGITAL DIAGNOSTIC BILATERAL MAMMOGRAM WITH CAD AND ADJUNCT TOMO BILATERAL BREAST ULTRASOUND COMPARISON:  None. ACR Breast Density Category b: There are scattered areas of fibroglandular density. FINDINGS: A BB has been placed on the upper-outer quadrant of the left breast indicating a palpable site of concern. There is a large lobulated mass with indistinct margins deep to the palpable marker measuring approximately 7.4 cm. There is a partially  visualized probable abnormal lymph node in the left axilla. The palpable marker in the right breast has been placed on the superior aspect. There is a possible spiculated mass in the tissue deep to the palpable marker. Additionally, anterior to the palpable marker in the superior right breast, middle depth there is a suspicious spiculated mass. In the medial aspect of the right breast, middle depth there is a 1.0 cm group of calcifications which appears linearly oriented on the CC view, however it spreads out on the lateral views and does not maintain the suspicious linear distribution. 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 Mammographic images were processed with CAD. There is a large protruding palpable mass in the upper-outer quadrant of the left breast at the patient's palpable site of concern. In the superior right breast there is a subtle nodularity to the tissue in the area of concern. Ultrasound of the palpable site in the left breast at 1130, 10 cm from the nipple is a large irregular hypoechoic mass measuring 6.3 x 6.1 x 5.2 cm. Internal vascularity is identified on color Doppler imaging. Ultrasound of the left axilla demonstrates 3 abnormally enlarged lymph nodes with cortex measuring up to 1.5 cm. Ultrasound targeted to the palpable site in the superior right breast at 1 o'clock, 10 cm from the nipple demonstrates an irregular hypoechoic mass measuring 1.4 x 0.8 x 0.9 cm. At a site slightly more lateral in closer to the nipple at 12:30, 8 cm from the nipple there is a superficial area of shadowing measuring 5 x 3 x 3 mm. At 12:30, 3 cm from the nipple there is another irregular hypoechoic mass measuring 1.3 x 0.8 x 0.9 cm. In the right breast at 1 o'clock, 4 cm from the nipple there is an oval mass with indistinct somewhat angular margins measuring 8 x 7 x 7 mm. No evidence of right axillary  lymphadenopathy. IMPRESSION: 1. The palpable mass in the left breast at 11:30 measures up to 7.4 cm mammographically and up to 6.3 cm by ultrasound. This is highly suspicious for malignancy. 2. There are 3 abnormally enlarged left axillary lymph nodes identified by ultrasound. 3. There are multiple suspicious masses identified in the superior and upper inner quadrant of the right breast, 1 of which corresponds with the palpable site identified by the patient. The largest of these masses measures 1.4 cm. 4.  No evidence of right axillary lymphadenopathy. RECOMMENDATION: 1. Ultrasound-guided biopsy is recommended for 2 sites in the right breast;  including the palpable site at 1 o'clock, 10 cm from the nipple, and the spiculated mass at 12:30, 3 cm from the nipple. 2. Ultrasound-guided biopsy is recommended for the palpable left breast mass at 11:30, and for 1 of the abnormal left axillary lymph nodes. These for procedures have been scheduled for05/21/2018 at 10:30 a.m. The patient is very nervous about the procedures. I feel confident I can help her get through the procedures, however given her level of anxiety she may be a good candidate for anxiolysis. I have discussed the findings and recommendations with the patient. Results were also provided in writing at the conclusion of the visit. If applicable, a reminder letter will be sent to the patient regarding the next appointment. BI-RADS CATEGORY  5: Highly suggestive of malignancy. Electronically Signed   By: [name]   On: 08/28/2016 15:24   Mm Diag Breast Tomo Bilateral  Result Date: 08/28/2016 CLINICAL DATA:  76 year old female presenting for new baseline mammogram for evaluation of palpable masses in the bilateral breasts. EXAM: 2D DIGITAL DIAGNOSTIC BILATERAL MAMMOGRAM WITH CAD AND ADJUNCT TOMO BILATERAL BREAST ULTRASOUND COMPARISON:  None. ACR Breast Density Category b: There are scattered areas of fibroglandular density. FINDINGS: A BB has  been placed on the upper-outer quadrant of the left breast indicating a palpable site of concern. There is a large lobulated mass with indistinct margins deep to the palpable marker measuring approximately 7.4 cm. There is a partially visualized probable abnormal lymph node in the left axilla. The palpable marker in the right breast has been placed on the superior aspect. There is a possible spiculated mass in the tissue deep to the palpable marker. Additionally, anterior to the palpable marker in the superior right breast, middle depth there is a suspicious spiculated mass. In the medial aspect of the right breast, middle depth there is a 1.0 cm group of calcifications which appears linearly oriented on the CC view, however it spreads out on the lateral views and does not maintain the suspicious linear distribution. 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 Mammographic images were processed with CAD. There is a large protruding palpable mass in the upper-outer quadrant of the left breast at the patient's palpable site of concern. In the superior right breast there is a subtle nodularity to the tissue in the area of concern. Ultrasound of the palpable site in the left breast at 1130, 10 cm from the nipple is a large irregular hypoechoic mass measuring 6.3 x 6.1 x 5.2 cm. Internal vascularity is identified on color Doppler imaging. Ultrasound of the left axilla demonstrates 3 abnormally enlarged lymph nodes with cortex measuring up to 1.5 cm. Ultrasound targeted to the palpable site in the superior right breast at 1 o'clock, 10 cm from the nipple demonstrates an irregular hypoechoic mass measuring 1.4 x 0.8 x 0.9 cm. At a site slightly more lateral in closer to the nipple at 12:30, 8 cm from the nipple there is a superficial area of shadowing measuring 5 x 3 x 3 mm. At 12:30, 3 cm from the nipple there is another  irregular hypoechoic mass measuring 1.3 x 0.8 x 0.9 cm. In the right breast at 1 o'clock, 4 cm from the nipple there is an oval mass with indistinct somewhat angular margins measuring 8 x 7 x 7 mm. No evidence of right axillary lymphadenopathy. IMPRESSION: 1. The palpable mass in the left breast at 11:30 measures up to 7.4 cm  mammographically and up to 6.3 cm by ultrasound. This is highly suspicious for malignancy. 2. There are 3 abnormally enlarged left axillary lymph nodes identified by ultrasound. 3. There are multiple suspicious masses identified in the superior and upper inner quadrant of the right breast, 1 of which corresponds with the palpable site identified by the patient. The largest of these masses measures 1.4 cm. 4.  No evidence of right axillary lymphadenopathy. RECOMMENDATION: 1. Ultrasound-guided biopsy is recommended for 2 sites in the right breast; including the palpable site at 1 o'clock, 10 cm from the nipple, and the spiculated mass at 12:30, 3 cm from the nipple. 2. Ultrasound-guided biopsy is recommended for the palpable left breast mass at 11:30, and for 1 of the abnormal left axillary lymph nodes. These for procedures have been scheduled for05/21/2018 at 10:30 a.m. The patient is very nervous about the procedures. I feel confident I can help her get through the procedures, however given her level of anxiety she may be a good candidate for anxiolysis. I have discussed the findings and recommendations with the patient. Results were also provided in writing at the conclusion of the visit. If applicable, a reminder letter will be sent to the patient regarding the next appointment. BI-RADS CATEGORY  5: Highly suggestive of malignancy. Electronically Signed   By: [name]   On: 08/28/2016 15:24   [place] Axillary Node Core Biopsy Left  Addendum Date: 09/03/2016   ADDENDUM REPORT: 09/03/2016 14:21 ADDENDUM: Pathology revealed GRADE III INVASIVE DUCTAL CARCINOMA, LYMPHOVASCULAR  INVASION IS IDENTIFIED of the Left breast, 11:30 o'clock. 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 This was found to be concordant by Dr. Ammie Ferrier. Pathology results were discussed with the patient and her son, [name], by telephone. The patient reported doing well after the biopsies with tenderness at the sites. Post biopsy instructions and care were reviewed and questions were answered. The patient was encouraged to call [facility] for any additional concerns. The patient was referred to [facility] on Sep 11, 2016. If breast conservation is a consideration, MRI is recommended. Pathology results reported by Terie Purser, RN on 09/03/2016. Electronically Signed   By: [name]   On: 09/03/2016 14:21   Result Date: 09/03/2016 CLINICAL DATA:  76 year old female presenting for ultrasound-guided biopsy of bilateral breast masses and a left axillary lymph node. EXAM: ULTRASOUND GUIDED BILATERAL BREAST CORE NEEDLE BIOPSY COMPARISON:  Previous exam(s). FINDINGS: I met with the patient and we discussed the procedure of ultrasound-guided biopsy, including benefits and alternatives. We discussed the high likelihood of a successful procedure. We discussed the risks of the procedure, including infection, bleeding, tissue injury, clip migration, and inadequate sampling. Informed written consent was given. The usual time-out protocol was performed immediately prior to the procedure. Lesion quadrant: Left upper inner at 11:30 Using sterile technique and 1% Lidocaine as local anesthetic, under direct ultrasound visualization, a 14 gauge spring-loaded device was used to perform biopsy of left breast mass at 11:30 using an inferolateral approach. At the conclusion of the procedure a  ribbon tissue marker clip was deployed into the biopsy cavity. Lesion quadrant: Left axilla Using sterile technique and 1% Lidocaine as local anesthetic, under direct ultrasound visualization, a 14 gauge spring-loaded device was used to perform biopsy of a left axillary lymph node using  an inferior approach. At the conclusion of the procedure a spiral tissue marker clip was deployed into the biopsy cavity. -------------------------------------------------------------------------------------------------------------- Lesion quadrant: Right upper inner at 12:30 Using sterile technique and 1% Lidocaine as local anesthetic, under direct ultrasound visualization, a 14 gauge spring-loaded device was used to perform biopsy of a right breast mass at 12:30 using a medial approach. At the conclusion of the procedure a coil shaped tissue marker clip was deployed into the biopsy cavity. Lesion quadrant: Right upper inner at 1 o'clock Using sterile technique and 1% Lidocaine as local anesthetic, under direct ultrasound visualization, a 14 gauge spring-loaded device was used to perform biopsy of area of shadowing in the right breast at 1 o'clock using a medial approach through the same incision site as the previous. At the conclusion of the procedure a heart shaped tissue marker clip was deployed into the biopsy cavity. Follow up 2 view mammogram was performed and dictated separately. IMPRESSION: 1. Ultrasound guided biopsy of a large palpable left breast mass at 11:30. No apparent complications. 2. Ultrasound-guided biopsy of an abnormal left axillary lymph node. No apparent complications. 3. Ultrasound-guided biopsy of a spiculated right breast mass at 12:30. No apparent complications. 4. Ultrasound-guided biopsy of a palpable right breast mass at 1 o'clock. No apparent complications. Electronically Signed: By: [name] On: 09/02/2016 12:02   Mm Clip Placement Left  Result Date: 09/02/2016 CLINICAL DATA:  Post  biopsy mammogram for clip placement. EXAM: DIAGNOSTIC BILATERAL MAMMOGRAM POST ULTRASOUND BIOPSY COMPARISON:  Previous exam(s). FINDINGS: Mammographic images were obtained following ultrasound guided biopsy of a large left breast mass at 11:30, an abnormal left axillary lymph node, a spiculated right breast mass at 12:30 an a palpable right breast mass at 1 o'clock. The ribbon shaped biopsy marking clip is appropriately positioned within the palpable mass at 11:30 in the left breast. The spiral shaped biopsy marking clip within the left axillary lymph node could not be included on the post biopsy mammogram. The coil shaped biopsy marking clip is approximately 5 mm posteriorly displaced from the spiculated mass at 12:30. The heart shaped biopsy marking clip is appropriately positioned at the site of the biopsied mass labeled at 1 o'clock (however, upon review of the diagnostic workup thus corresponds with the mass previously labeled 1230, 8 cm from the nipple). IMPRESSION: 1. Appropriate positioning of the ribbon shaped biopsy marking clip in the large mass in the left breast at 11:30. 2. Appropriate positioning of the spiral shaped biopsy marking clip in the left axillary lymph node. 3. Appropriate positioning of the coil shaped biopsy marking clip in the right breast at 12:30. 4. Appropriate positioning of the heart shaped biopsy marking clip in the right breast labeled 1 o'clock (note that compared with the diagnostic images, this mass corresponds with the mass labeled "12:30, 8 cmfn"). Final Assessment: Post Procedure Mammograms for Marker Placement Electronically Signed   By: [name]   On: 09/02/2016 12:15   Mm Clip Placement Right  Result Date: 09/02/2016 CLINICAL DATA:  Post biopsy mammogram for clip placement. EXAM: DIAGNOSTIC BILATERAL MAMMOGRAM POST ULTRASOUND BIOPSY COMPARISON:  Previous exam(s). FINDINGS: Mammographic images were obtained following ultrasound guided biopsy of a large  left breast mass at 11:30, an abnormal left axillary lymph node, a spiculated right breast mass at 12:30 an a palpable right breast mass at 1 o'clock. The ribbon shaped biopsy marking clip is appropriately positioned within the palpable mass at 11:30 in the left breast. The spiral shaped  biopsy marking clip within the left axillary lymph node could not be included on the post biopsy mammogram. The coil shaped biopsy marking clip is approximately 5 mm posteriorly displaced from the spiculated mass at 12:30. The heart shaped biopsy marking clip is appropriately positioned at the site of the biopsied mass labeled at 1 o'clock (however, upon review of the diagnostic workup thus corresponds with the mass previously labeled 1230, 8 cm from the nipple). IMPRESSION: 1. Appropriate positioning of the ribbon shaped biopsy marking clip in the large mass in the left breast at 11:30. 2. Appropriate positioning of the spiral shaped biopsy marking clip in the left axillary lymph node. 3. Appropriate positioning of the coil shaped biopsy marking clip in the right breast at 12:30. 4. Appropriate positioning of the heart shaped biopsy marking clip in the right breast labeled 1 o'clock (note that compared with the diagnostic images, this mass corresponds with the mass labeled "12:30, 8 cmfn"). Final Assessment: Post Procedure Mammograms for Marker Placement Electronically Signed   By: [name]   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 This was found to be concordant by Dr. Ammie Ferrier. Pathology results were discussed  with the patient and her son, [name], by telephone. The patient reported doing well after the biopsies with tenderness at the sites. Post biopsy instructions and care were reviewed and questions were answered. The patient was encouraged to call [facility] for any additional concerns. The patient was referred to [facility] on Sep 11, 2016. If breast conservation is a consideration, MRI is recommended. Pathology results reported by Terie Purser, RN on 09/03/2016. Electronically Signed   By: [name]   On: 09/03/2016 14:21   Result Date: 09/03/2016 CLINICAL DATA:  76 year old female presenting for ultrasound-guided biopsy of bilateral breast masses and a left axillary lymph node. EXAM: ULTRASOUND GUIDED BILATERAL BREAST CORE NEEDLE BIOPSY COMPARISON:  Previous exam(s). FINDINGS: I met with the patient and we discussed the procedure of ultrasound-guided biopsy, including benefits and alternatives. We discussed the high likelihood of a successful procedure. We discussed the risks of the procedure, including infection, bleeding, tissue injury, clip migration, and inadequate sampling. Informed written consent was given. The usual time-out protocol was performed immediately prior to the procedure. Lesion quadrant: Left upper inner at 11:30 Using sterile technique and 1% Lidocaine as local anesthetic, under direct ultrasound visualization, a 14 gauge spring-loaded device was used to perform biopsy of left breast mass at 11:30 using an inferolateral approach. At the conclusion of the procedure a ribbon tissue marker clip was deployed into the biopsy cavity. Lesion quadrant: Left axilla Using sterile technique and 1% Lidocaine as local anesthetic, under direct ultrasound visualization, a 14 gauge spring-loaded device was used to perform biopsy of a left axillary lymph node using an inferior approach.  At the conclusion of the procedure a spiral tissue marker clip was deployed into the biopsy cavity. -------------------------------------------------------------------------------------------------------------- Lesion quadrant: Right upper inner at 12:30 Using sterile technique and 1% Lidocaine as local anesthetic, under direct ultrasound visualization,  a 14 gauge spring-loaded device was used to perform biopsy of a right breast mass at 12:30 using a medial approach. At the conclusion of the procedure a coil shaped tissue marker clip was deployed into the biopsy cavity. Lesion quadrant: Right upper inner at 1 o'clock Using sterile technique and 1% Lidocaine as local anesthetic, under direct ultrasound visualization, a 14 gauge spring-loaded device was used to perform biopsy of area of shadowing in the right breast at 1 o'clock using a medial approach through the same incision site as the previous. At the conclusion of the procedure a heart shaped tissue marker clip was deployed into the biopsy cavity. Follow up 2 view mammogram was performed and dictated separately. IMPRESSION: 1. Ultrasound guided biopsy of a large palpable left breast mass at 11:30. No apparent complications. 2. Ultrasound-guided biopsy of an abnormal left axillary lymph node. No apparent complications. 3. Ultrasound-guided biopsy of a spiculated right breast mass at 12:30. No apparent complications. 4. Ultrasound-guided biopsy of a palpable right breast mass at 1 o'clock. No apparent complications. Electronically Signed: By: [name] 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 This was found to be concordant by Dr. Ammie Ferrier. Pathology results were discussed with the patient and her son, [name], by telephone. The patient reported doing well after the biopsies with tenderness at the sites. Post biopsy instructions and care were reviewed and questions were answered. The patient was encouraged to call [facility] for any additional concerns. The patient was referred to [facility] on Sep 11, 2016. If breast conservation is a consideration, MRI is recommended. Pathology results reported by Terie Purser, RN on 09/03/2016. Electronically Signed   By: [name]   On: 09/03/2016 14:21   Result Date: 09/03/2016 CLINICAL DATA:  76 year old female presenting for ultrasound-guided biopsy of bilateral breast masses and a left axillary lymph node. EXAM: ULTRASOUND GUIDED BILATERAL BREAST CORE NEEDLE BIOPSY COMPARISON:  Previous exam(s). FINDINGS: I met with the patient and we discussed the procedure of ultrasound-guided biopsy, including benefits and alternatives. We discussed the high likelihood of a successful procedure. We discussed the risks of the procedure, including infection, bleeding, tissue injury, clip migration, and inadequate sampling. Informed written consent was given. The usual time-out protocol was performed immediately prior to the procedure. Lesion quadrant: Left upper inner at 11:30 Using sterile technique and 1% Lidocaine as local anesthetic, under direct ultrasound visualization, a 14 gauge spring-loaded device was used to perform biopsy of left breast mass at 11:30 using an inferolateral approach. At the conclusion of the procedure a ribbon tissue marker clip was deployed into the biopsy cavity. Lesion quadrant: Left axilla Using sterile technique and 1% Lidocaine as local anesthetic, under direct ultrasound  visualization, a 14 gauge spring-loaded device was used to perform biopsy of a left axillary lymph node using an inferior approach. At the conclusion of the procedure a spiral tissue marker clip was deployed into the biopsy cavity. -------------------------------------------------------------------------------------------------------------- Lesion quadrant: Right upper inner at 12:30 Using sterile technique and 1% Lidocaine as local  anesthetic, under direct ultrasound visualization, a 14 gauge spring-loaded device was used to perform biopsy of a right breast mass at 12:30 using a medial approach. At the conclusion of the procedure a coil shaped tissue marker clip was deployed into the biopsy cavity. Lesion quadrant: Right upper inner at 1 o'clock Using sterile technique and 1% Lidocaine as local anesthetic, under direct ultrasound visualization, a 14 gauge spring-loaded device was used to perform biopsy of area of shadowing in the right breast at 1 o'clock using a medial approach through the same incision site as the previous. At the conclusion of the procedure a heart shaped tissue marker clip was deployed into the biopsy cavity. Follow up 2 view mammogram was performed and dictated separately. IMPRESSION: 1. Ultrasound guided biopsy of a large palpable left breast mass at 11:30. No apparent complications. 2. Ultrasound-guided biopsy of an abnormal left axillary lymph node. No apparent complications. 3. Ultrasound-guided biopsy of a spiculated right breast mass at 12:30. No apparent complications. 4. Ultrasound-guided biopsy of a palpable right breast mass at 1 o'clock. No apparent complications. Electronically Signed: By: [name] 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 This was found to be concordant by Dr. Ammie Ferrier. Pathology results were discussed with the patient and her son, [name], by telephone. The patient reported doing well after the biopsies with tenderness at the sites. Post biopsy instructions and care were reviewed and questions were answered. The patient was encouraged to call [facility] for any additional concerns. The patient was referred to [facility] on Sep 11, 2016. If breast conservation is a consideration, MRI is recommended. Pathology results reported by Terie Purser, RN on 09/03/2016. Electronically Signed   By: [name]   On: 09/03/2016 14:21   Result Date: 09/03/2016 CLINICAL DATA:  76 year old female presenting for ultrasound-guided biopsy of bilateral breast masses and a left axillary lymph node. EXAM: ULTRASOUND GUIDED BILATERAL BREAST CORE NEEDLE BIOPSY COMPARISON:  Previous exam(s). FINDINGS: I met with the patient and we discussed the procedure of ultrasound-guided biopsy, including benefits and alternatives. We discussed the high likelihood of a successful procedure. We discussed the risks of the procedure, including infection, bleeding, tissue injury, clip migration, and inadequate sampling. Informed written consent was given. The usual time-out protocol was performed immediately prior to the procedure. Lesion quadrant: Left upper inner at 11:30 Using sterile technique and 1% Lidocaine as local anesthetic, under direct ultrasound visualization, a 14 gauge spring-loaded device was used to perform biopsy of left breast mass at 11:30 using an inferolateral approach. At the conclusion of the procedure a ribbon tissue marker clip was deployed into  the biopsy cavity. Lesion quadrant: Left axilla Using sterile technique and 1% Lidocaine as local anesthetic, under direct ultrasound visualization, a 14 gauge spring-loaded device was used to perform biopsy of a left axillary lymph node using an inferior approach. At the conclusion of the procedure a spiral tissue marker clip was deployed into the biopsy cavity. -------------------------------------------------------------------------------------------------------------- Lesion quadrant: Right upper inner at 12:30 Using sterile  technique and 1% Lidocaine as local anesthetic, under direct ultrasound visualization, a 14 gauge spring-loaded device was used to perform biopsy of a right breast mass at 12:30 using a medial approach. At the conclusion of the procedure a coil shaped tissue marker clip was deployed into the biopsy cavity. Lesion quadrant: Right upper inner at 1 o'clock Using sterile technique and 1% Lidocaine as local anesthetic, under direct ultrasound visualization, a 14 gauge spring-loaded device was used to perform biopsy of area of shadowing in the right breast at 1 o'clock using a medial approach through the same incision site as the previous. At the conclusion of the procedure a heart shaped tissue marker clip was deployed into the biopsy cavity. Follow up 2 view mammogram was performed and dictated separately. IMPRESSION: 1. Ultrasound guided biopsy of a large palpable left breast mass at 11:30. No apparent complications. 2. Ultrasound-guided biopsy of an abnormal left axillary lymph node. No apparent complications. 3. Ultrasound-guided biopsy of a spiculated right breast mass at 12:30. No apparent complications. 4. Ultrasound-guided biopsy of a palpable right breast mass at 1 o'clock. No apparent complications. Electronically Signed: By: [name] On: 09/02/2016 12:02      IMPRESSION: 76 y.o. 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  PLAN:  Patient will undergo MRI given diagnosis of left sided invasive ductal carcinoma and right sided lobular carcinoma. She will be referred for genetic counseling. She will also undergo mammaprint on the core.  She would be a candidate for neoadjuvant chemotherapy.  CT and bone scan for staging workup. Surgical treatment to be determined. XRT to be determined. Patient may then proceed with aromatase inhibitor for approximately 5 years.     ------------------------------------------------  Blair Promise, PhD, MD  This document serves as a record of services personally performed by Gery Pray, MD. It was created on his behalf by Maryla Morrow, a trained medical scribe. The creation of this record is based on the scribe's personal observations and the provider's statements to them. This document has been checked and approved by the attending provider.

## 2016-09-11 NOTE — Therapy (Signed)
[facility] [place], [place], [address] Phone: [phone]   Fax:  [phone]  Physical Therapy Evaluation  Patient Details  Name: Laurie Collins MRN: [id number] Date of Birth: [birth year] Referring Provider: Dr. [name]  Encounter Date: 09/11/2016      PT End of Session - 09/11/16 1429    Visit Number 1   Number of Visits 8   Date for PT Re-Evaluation 10/09/16   PT Start Time 1115   PT Stop Time 1129  Also saw pt from [phone] for a total of 37 minutes   PT Time Calculation (min) 14 min   Activity Tolerance Patient tolerated treatment well   Behavior During Therapy [facility] for tasks assessed/performed      Past Medical History:  Diagnosis Date  . Anxiety   . Depression   . Diabetes mellitus without complication ([place])   . Hypertension     History reviewed. No pertinent surgical history.  There were no vitals filed for this visit.       Subjective Assessment - 09/11/16 1250    Subjective Patient reports she is here today to be seen by her medical team for her newly diagnosed bilateral breast cancer.   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 Her left breast has a mass that measures 6.3 cm in the upper inner quadrant. The right breast has a mass that measures 1.4 cm in the upper inner quadrant. She has a biopsied positive axillary node on the left side. She also has COPD, postural deficits, diabetes, and hypertension.   Patient Stated Goals Reduce lymphedema risk, become more mobile and functional, and learn post op shoulder ROM HEP   Currently in Pain? No/denies            [facility] PT Assessment - 09/11/16 0001      Assessment   Medical Diagnosis Bilateral breast cancer   Referring Provider Dr. [name]   Onset Date/Surgical Date  08/28/16   Hand Dominance Right   Prior Therapy none     Precautions   Precautions Other (comment)   Precaution Comments active cancer; COPD     Restrictions   Weight Bearing Restrictions No     Balance Screen   Has the patient fallen in the past 6 months No   Has the patient had a decrease in activity level because of a fear of falling?  No   Is the patient reluctant to leave their home because of a fear of falling?  No     Home Ecologist residence   Living Arrangements Children;Other relatives  Son and 76 y.o. granddaughter   Available Help at Discharge Family     Prior Function   Level of [place] with household mobility without device   Vocation Retired   Leisure She does not exercise     Cognition   Overall Cognitive Status Within Functional Limits for tasks assessed     Functional Tests   Functional tests Sit to Stand     Sit to Stand   Comments Able to complete sit to stand 10x without UEs slowly (37.8 seconds)     Posture/Postural Control   Posture/Postural Control Postural limitations   Postural Limitations Rounded Shoulders;Forward head;Increased thoracic kyphosis     ROM / Strength   AROM / PROM / Strength  AROM;Strength     AROM   AROM Assessment Site Shoulder;Cervical   Right/Left Shoulder Right;Left   Right Shoulder Extension 38 Degrees   Right Shoulder Flexion 126 Degrees   Right Shoulder ABduction 110 Degrees   Right Shoulder Internal Rotation 70 Degrees   Right Shoulder External Rotation 75 Degrees   Left Shoulder Extension 38 Degrees   Left Shoulder Flexion 123 Degrees   Left Shoulder ABduction 110 Degrees   Left Shoulder Internal Rotation 84 Degrees   Left Shoulder External Rotation 74 Degrees   Cervical Flexion WNL   Cervical Extension 50% limited   Cervical - Right Side Bend 50% limited   Cervical - Left Side Bend 50% limited   Cervical - Right Rotation 50% limited   Cervical - Left  Rotation 50% limited     Strength   Overall Strength Within functional limits for tasks performed   Overall Strength Comments Not assessed specifically by muscle group but is functional     6 Minute Walk- Baseline   6 Minute Walk- Baseline no  Unable to perform at [facility]; will test next visit           LYMPHEDEMA/ONCOLOGY QUESTIONNAIRE - 09/11/16 1424      Type   Cancer Type Bilateral breast cancer     Lymphedema Assessments   Lymphedema Assessments Upper extremities     Right Upper Extremity Lymphedema   10 cm Proximal to Olecranon Process 30.1 cm   Olecranon Process 25.6 cm   10 cm Proximal to Ulnar Styloid Process 21.8 cm   Just Proximal to Ulnar Styloid Process 16.9 cm   Across Hand at [employer] 19 cm   At [place] of 2nd Digit 6.9 cm     Left Upper Extremity Lymphedema   10 cm Proximal to Olecranon Process 30.8 cm   Olecranon Process 26.4 cm   10 cm Proximal to Ulnar Styloid Process 20.8 cm   Just Proximal to Ulnar Styloid Process 16.3 cm   Across Hand at [employer] 18.4 cm   At [place] of 2nd Digit 6.4 cm         Objective measurements completed on examination: See above findings.      Patient was instructed today in a home exercise program today for post op shoulder range of motion. These included active assist shoulder flexion in sitting, scapular retraction, wall walking with shoulder abduction, and hands behind head external rotation.  She was encouraged to do these twice a day, holding 3 seconds and repeating 5 times when permitted by her physician.        PT Education - 09/11/16 1426    Education provided Yes   Education Details Lymphedema risk reduction and post op shoulder ROM HEP   Person(s) Educated Patient;Child(ren)   Methods Explanation;Demonstration;Handout   Comprehension Verbalized understanding;Returned demonstration              Breast Clinic Goals - 09/11/16 1447      Patient will be able to verbalize  understanding of pertinent lymphedema risk reduction practices relevant to her diagnosis specifically related to skin care.   Time 1   Period Days   Status Achieved     Patient will be able to return demonstrate and/or verbalize understanding of the post-op home exercise program related to regaining shoulder range of motion.   Time 1   Period Days   Status Achieved     Patient will be able to verbalize understanding of the importance of  attending the postoperative After Breast Cancer Class for further lymphedema risk reduction education and therapeutic exercise.   Time 1   Period Days   Status Achieved          Long Term Clinic Goals - 09/11/16 1448      CC Long Term Goal  #1   Title Patient will perform home exercise program independently for improved overall function.   Time 4   Period Weeks   Status New     CC Long Term Goal  #2   Title Patient will perform sit to stand 5x in </= 12 seconds for improved overall function.   Time 4   Period Weeks   Status New     CC Long Term Goal  #3   Title Patient will demonstrate good understanding of the Strength After Breast Cancer exercise program and verbalize understanding of a walking program.   Time 4   Period Weeks   Status New     CC Long Term Goal  #4   Title Patient will report she is able to walk to her mailbox and back without rest and without uncomfortable shortness of breath.   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 Her left breast has a mass that measures 6.3 cm in the upper inner quadrant. The right breast has a mass that measures 1.4 cm in the upper inner quadrant. She has a biopsied positive axillary node on the left side. She also has COPD, postural deficits, diabetes, and hypertension. Her multidisciplinary  medical team met prior to her assessments to determine a recommended treatment plan. She is planning to have a breast MRI followed by neoadjuvant chemotherapy. She will then have either a bilateral lumpectomy or mastectomy with a right sentinel node biopsy and left targeted axillary dissection. She will then undergo radiation and anti-estrogen therapy. She will benefit from PT now to improve her functional status with ambulation and tolerance of daily activities to improve tolerance of chemotherapy and surgical outcomes. She will benefit from post op PT to regain shoulder ROM and reduce lymphedema risk. Due to her evolving condition and potential change in breast cancer status with pending MRI results, and due to her comorbidity of COPD, her eval is of moderate complexity.   History and Personal Factors relevant to plan of care: COPD   Clinical Presentation Evolving   Clinical Presentation due to: Potential change in breast cancer status with pending MRI results   Clinical Decision Making Moderate   Rehab Potential Good   Clinical Impairments Affecting Rehab Potential COPD   PT Frequency 2x / week   PT Duration 8 weeks   PT Treatment/Interventions ADLs/Self Care Home Management;Therapeutic activities;Therapeutic exercise;Patient/family education;Functional mobility training   PT Next Visit Plan 6 min walk test; begin HEP for functional strength; educate on walking program (begin with walking to mailbox)   PT Home Exercise Plan post op shoulder ROM HEP   Consulted and Agree with Plan of Care Patient;Family member/caregiver   Family Member Consulted son      Patient will benefit from skilled therapeutic intervention in order to improve the following deficits and impairments:  Postural dysfunction, Decreased knowledge of precautions, Pain, Impaired UE functional use, Decreased range of motion, Decreased endurance, Decreased  activity tolerance, Decreased mobility  Visit Diagnosis: Carcinoma of  upper-inner quadrant of right breast in female, estrogen receptor positive ([place]) - Plan: PT plan of care cert/re-cert  Carcinoma of upper-inner quadrant of left breast in female, estrogen receptor negative ([place]) - Plan: PT plan of care cert/re-cert  Abnormal posture - Plan: PT plan of care cert/re-cert  Difficulty in walking, not elsewhere classified - Plan: PT plan of care cert/re-cert  Stiffness of left shoulder, not elsewhere classified - Plan: PT plan of care cert/re-cert  Stiffness of right shoulder, not elsewhere classified - Plan: PT plan of care cert/re-cert  Patient will follow up at outpatient cancer rehab if needed following surgery.  If the patient requires physical therapy at that time, a specific plan will be dictated and sent to the referring physician for approval. The patient was educated today on appropriate basic range of motion exercises to begin post operatively and the importance of attending the After Breast Cancer class following surgery.  Patient was educated today on lymphedema risk reduction practices as it pertains to recommendations that will benefit the patient immediately following surgery.  She verbalized good understanding.  No additional physical therapy is indicated at this time.        G-Codes - 10-05-2016 1451    Functional Assessment Tool Used (Outpatient Only) Clinical Judgement   Functional Limitation Mobility: Walking and moving around   Mobility: Walking and Moving Around Current Status [phone]) At least 60 percent but less than 80 percent impaired, limited or restricted   Mobility: Walking and Moving Around Goal Status [phone]) At least 20 percent but less than 40 percent impaired, limited or restricted       Problem List Patient Active Problem List   Diagnosis Date Noted  . Malignant neoplasm of upper-inner quadrant of left breast in female, estrogen receptor negative ([place]) 09/05/2016  . Malignant neoplasm of upper-inner quadrant of right breast  in female, estrogen receptor positive ([place]) 09/05/2016    Annia Friendly, PT 10-05-16 2:55 PM  [facility] [address] [place], [place], [address] Phone: [phone]   Fax:  [phone]  Name: DAJANE VALLI MRN: [id number] Date of Birth: 01/01/1941

## 2016-09-11 NOTE — Progress Notes (Signed)
Nutrition Assessment  Reason for Assessment:  Pt seen in Breast Clinic  ASSESSMENT:   76 year old female with new diagnosis of breast cancer. Past medical history of DM, HTN.    Son with patient and concerned about patient's sleep habits (ie stays up all night) and eats during the night  Medications:  reviewed  Labs: reviewed  Anthropometrics:   Height: 63 inches Weight: 184 lb 14.4 oz BMI: 32.8   NUTRITION DIAGNOSIS: Food and nutrition related knowledge deficit related to new diagnosis of breast cancer as evidenced by no prior need for nutrition related information.  INTERVENTION:   Discussed and provided packet of information regarding nutritional tips for breast cancer patients.  Questions answered.  Teachback method used.  Contact information provided and patient knows to contact me with questions/concerns.    MONITORING, EVALUATION, and GOAL: Pt will consume a healthy plant based diet to maintain lean body mass throughout treatment.   Trayvion Embleton B. Zenia Resides, [place], [place] Registered Dietitian [phone] (pager)

## 2016-09-11 NOTE — Progress Notes (Signed)
[facility] CONSULT NOTE  Patient Care Team: [name], MD as PCP - General (Family Medicine) [name], MD as Consulting Physician (General Surgery) Nicholas Lose, MD as Consulting Physician (Hematology and Oncology)  CHIEF COMPLAINTS/PURPOSE OF CONSULTATION:  Newly diagnosed breast cancer  HISTORY OF PRESENTING ILLNESS:  Laurie Collins 76 y.o. female is here because of recent diagnosis of bilateral breast cancers. Patient felt a lump in the left breast for at least 4-5 months and did not bring it to anyone's attention. She also felt a lump on the opposite side. She subsequently underwent a mammogram and ultrasound. Patient's son is a primary caregiver and reports that she has very poor sleep habits and dietary habits. He's been trying to get her to exercise more and stay active but she has not been interested in it. She also apparently has a fatalistic attitude towards life and doesn't seem motivated to help her feel and get better.  I reviewed her records extensively and collaborated the history with the patient.  SUMMARY OF ONCOLOGIC HISTORY:   Malignant neoplasm of upper-inner quadrant of left breast in female, estrogen receptor negative ([place])   09/02/2016 Initial Diagnosis    Bilateral breast cancers: Left breast 11:30 position 6.3 cm with 3 abnormal lymph nodes biopsy of both IDC grade 2 LVI, triple negative, Ki-67 90% T3 N1 stage IIIB; right breast multiple masses 1.4 cm and 1:00, 5 mm 12:30 axilla negative biopsy ILC grade 2, ER 90%, PR 30%, Ki-67 10%, HER-2 negative ratio 1.74, T1 cN0 stage IA      MEDICAL HISTORY:  Past Medical History:  Diagnosis Date  . Anxiety   . Depression   . Diabetes mellitus without complication ([place])   . Hypertension     SURGICAL HISTORY: History reviewed. No pertinent surgical history.  SOCIAL HISTORY: Social History   Social History  . Marital status: Widowed    Spouse name: N/A  . Number of children: N/A   . Years of education: N/A   Occupational History  . Not on file.   Social History Main Topics  . Smoking status: Former Research scientist (life sciences)  . Smokeless tobacco: Never Used  . Alcohol use No  . Drug use: No  . Sexual activity: Not on file   Other Topics Concern  . Not on file   Social History Narrative  . No narrative on file    FAMILY HISTORY: History reviewed. No pertinent family history.  ALLERGIES:  is allergic to penicillins.  MEDICATIONS:  Current Outpatient Prescriptions  Medication Sig Dispense Refill  . amLODipine (NORVASC) 10 MG tablet Take 10 mg by mouth daily.    Marland Kitchen aspirin 81 MG chewable tablet Chew 81 mg by mouth daily.    Marland Kitchen glimepiride (AMARYL) 1 MG tablet Take 1 mg by mouth daily with breakfast.    . hydrochlorothiazide (MICROZIDE) 12.5 MG capsule Take 12.5 mg by mouth daily.    Marland Kitchen lisinopril (PRINIVIL,ZESTRIL) 20 MG tablet Take 20 mg by mouth daily.    Marland Kitchen loperamide (IMODIUM) 2 MG capsule Take 2 mg by mouth as needed for diarrhea or loose stools.    . metFORMIN (GLUCOPHAGE) 1000 MG tablet Take 1,000 mg by mouth 2 (two) times daily with a meal.    . Metoprolol-Hydrochlorothiazide (METOPROLOL-HCTZ ER PO) Take 100 mg by mouth daily.    . Vitamin D, Cholecalciferol, 1000 units CAPS Take 2,000 Units by mouth daily.    Marland Kitchen LORazepam (ATIVAN) 1 MG tablet Take 1 tablet (1 mg total)  by mouth at bedtime. 30 tablet 0   No current facility-administered medications for this visit.     REVIEW OF SYSTEMS:   Constitutional: Denies fevers, chills or abnormal night sweats Eyes: Denies blurriness of vision, double vision or watery eyes Ears, nose, mouth, throat, and face: Denies mucositis or sore throat Respiratory: Denies cough, dyspnea or wheezes Cardiovascular: Denies palpitation, chest discomfort or lower extremity swelling Gastrointestinal:  Denies nausea, heartburn or change in bowel habits Skin: Denies abnormal skin rashes Lymphatics: Denies new lymphadenopathy or easy  bruising Neurological:Denies numbness, tingling or new weaknesses Behavioral/Psych: Mood is stable, no new changes  Breast:Large palpable lump in the left breast and small palpable lump in the right breast All other systems were reviewed with the patient and are negative.  PHYSICAL EXAMINATION: ECOG PERFORMANCE STATUS: 1 - Symptomatic but completely ambulatory  Vitals:   09/11/16 0934  BP: (!) 174/72  Pulse: 99  Resp: 17  Temp: 97.6 F (36.4 C)   Filed Weights   09/11/16 0934  Weight: 190 lb 8 oz (86.4 kg)    GENERAL:alert, no distress and comfortable SKIN: skin color, texture, turgor are normal, no rashes or significant lesions EYES: normal, conjunctiva are pink and non-injected, sclera clear OROPHARYNX:no exudate, no erythema and lips, buccal mucosa, and tongue normal  NECK: supple, thyroid normal size, non-tender, without nodularity LYMPH:  no palpable lymphadenopathy in the cervical, axillary or inguinal LUNGS: clear to auscultation and percussion with normal breathing effort HEART: regular rate & rhythm and no murmurs and no lower extremity edema ABDOMEN:abdomen soft, non-tender and normal bowel sounds Musculoskeletal:no cyanosis of digits and no clubbing  PSYCH: alert & oriented x 3 with fluent speech NEURO: no focal motor/sensory deficits BREAST: Very large palpable lump in the left breast and the smaller lumps in the right breast.. No palpable axillary or supraclavicular lymphadenopathy (exam performed in the presence of a chaperone)   LABORATORY DATA:  I have reviewed the data as listed Lab Results  Component Value Date   WBC 12.6 (H) 09/11/2016   HGB 10.3 (L) 09/11/2016   HCT 34.1 (L) 09/11/2016   MCV 72.4 (L) 09/11/2016   PLT 438 (H) 09/11/2016   Lab Results  Component Value Date   NA 139 09/11/2016   K 4.3 09/11/2016   CO2 27 09/11/2016    RADIOGRAPHIC STUDIES: I have personally reviewed the radiological reports and agreed with the findings in the  report.  ASSESSMENT AND PLAN:  Malignant neoplasm of upper-inner quadrant of left breast in female, estrogen receptor negative ([facility]) Bilateral breast cancers:  Left breast 11:30 position 6.3 cm with 3 abnormal lymph nodes biopsy of both IDC grade 2 LVI, triple negative, Ki-67 90% T3 N1 stage IIIB; Right breast multiple masses 1.4 cm and 1:00, 5 mm 12:30 axilla negative biopsy ILC grade 2, ER 90%, PR 30%, Ki-67 10%, HER-2 negative ratio 1.74, T1 cN0 stage IA  Pathology and radiology counseling: Discussed with the patient, the details of pathology including the type of breast cancer,the clinical staging, the significance of ER, PR and HER-2/neu receptors and the implications for treatment. After reviewing the pathology in detail, we proceeded to discuss the different treatment options between surgery, radiation, chemotherapy, antiestrogen therapies.  Recommendation: 1. Genetics and staging scans 2. breast MRI 3. Neoadjuvant chemotherapy: Weekly Taxol 12 and if she tolerates this well she will receive Adriamycin Cytoxan every 2 weeks 4 4. Followed by surgery 5. Followed by radiation 6. Followed by antiestrogen therapy because of right-sided breast cancer  is ER positive  Lifestyle changes: 1. Sleep: I recommended that she sleep at night stay awake during the daytime. I gave her a prescription for Ativan to be taken at bedtime at 11 PM. 2. patient has extremely poor dietary choices  She met with our dietitian as well as a physical therapist to increase her energy and strength. Her son is a primary caregiver and appears to have tremendous interest in getting her through.  Return to clinic in 2 weeks to start chemotherapy    All questions were answered. The patient knows to call the clinic with any problems, questions or concerns.    Rulon Eisenmenger, MD 09/11/16

## 2016-09-11 NOTE — Assessment & Plan Note (Signed)
Bilateral breast cancers:  Left breast 11:30 position 6.3 cm with 3 abnormal lymph nodes biopsy of both IDC grade 2 LVI, triple negative, Ki-67 90% T3 N1 stage IIIB; Right breast multiple masses 1.4 cm and 1:00, 5 mm 12:30 axilla negative biopsy ILC grade 2, ER 90%, PR 30%, Ki-67 10%, HER-2 negative ratio 1.74, T1 cN0 stage IA  Pathology and radiology counseling: Discussed with the patient, the details of pathology including the type of breast cancer,the clinical staging, the significance of ER, PR and HER-2/neu receptors and the implications for treatment. After reviewing the pathology in detail, we proceeded to discuss the different treatment options between surgery, radiation, chemotherapy, antiestrogen therapies.  Recommendation: 1. Genetics and staging scans 2. breast MRI 3. Neoadjuvant chemotherapy: Weekly Taxol 12 and if she tolerates this well she will receive Adriamycin Cytoxan every 2 weeks 4 4. Followed by surgery 5. Followed by radiation 6. Followed by antiestrogen therapy because of right-sided breast cancer is ER positive  Lifestyle changes: 1. Sleep: I recommended that she sleep at night stay awake during the daytime. I gave her a prescription for Ativan to be taken at bedtime at 11 PM. 2. patient has extremely poor dietary choices  She met with our dietitian as well as a physical therapist to increase her energy and strength. Her son is a primary caregiver and appears to have tremendous interest in getting her through.  Return to clinic in 2 weeks to start chemotherapy

## 2016-09-11 NOTE — Progress Notes (Signed)
Clinical Social Work [facility] Psychosocial Distress Screening [place]  Patient completed distress screening protocol and scored a 5 on the Psychosocial Distress Thermometer which indicates moderate distress. Clinical Social Worker met with patient and patients family in [facility] to assess for distress and other psychosocial needs. Patient stated she was feeling overwhelmed but felt "better" after meeting with the treatment team and getting more information on her treatment plan. CSW and patient discussed common feeling and emotions when being diagnosed with cancer, and the importance of support during treatment. CSW informed patient of the support team and support services at [facility]. CSW provided contact information and encouraged patient to call with any questions or concerns.   ONCBCN DISTRESS SCREENING 09/11/2016  Screening Type Initial Screening  Distress experienced in past week (1-10) 5    Johnnye Lana, MSW, LCSW, OSW-C Clinical Social Worker [place] [phone]

## 2016-09-11 NOTE — Patient Instructions (Signed)

## 2016-09-11 NOTE — Progress Notes (Signed)
START ON PATHWAY REGIMEN - Breast   Doxorubicin + Cyclophosphamide (AC):   A cycle is every 21 days:     Doxorubicin      Cyclophosphamide   **Always confirm dose/schedule in your pharmacy ordering system**    Paclitaxel 80 mg/m2 Weekly:   Administer weekly:     Paclitaxel   **Always confirm dose/schedule in your pharmacy ordering system**    Patient Characteristics: Preoperative or Nonsurgical Candidate (Clinical Staging), Neoadjuvant Therapy followed by Surgery, Invasive Disease, Chemotherapy, HER2 Negative/Unknown/Equivocal, ER Negative, Platinum Therapy Not Indicated Therapeutic Status: Preoperative or Nonsurgical Candidate (Clinical Staging) AJCC M Category: cM0 AJCC Grade: G3 Breast Surgical Plan: Neoadjuvant Therapy followed by Surgery ER Status: Negative (-) AJCC 8 Stage Grouping: IIIC HER2 Status: Negative (-) AJCC T Category: cT3 AJCC N Category: cN1 PR Status: Negative (-) Type of Therapy: Platinum Therapy Not Indicated  Intent of Therapy: Curative Intent, Discussed with Patient

## 2016-09-12 ENCOUNTER — Ambulatory Visit: Payer: Medicare HMO | Admitting: Physical Therapy

## 2016-09-12 ENCOUNTER — Encounter: Payer: Self-pay | Admitting: *Deleted

## 2016-09-17 ENCOUNTER — Encounter: Payer: Self-pay | Admitting: *Deleted

## 2016-09-17 ENCOUNTER — Other Ambulatory Visit: Payer: Medicare HMO

## 2016-09-17 ENCOUNTER — Encounter: Payer: Self-pay | Admitting: Physical Therapy

## 2016-09-17 ENCOUNTER — Telehealth: Payer: Self-pay | Admitting: *Deleted

## 2016-09-17 NOTE — Telephone Encounter (Signed)
Left vm to return call regarding [place] from 5.30.18

## 2016-09-18 ENCOUNTER — Telehealth: Payer: Self-pay | Admitting: Hematology and Oncology

## 2016-09-18 ENCOUNTER — Ambulatory Visit
Admission: RE | Admit: 2016-09-18 | Discharge: 2016-09-18 | Disposition: A | Discharge status: Home / Self Care | Payer: Medicare HMO | Source: Ambulatory Visit | Attending: Hematology and Oncology | Admitting: Hematology and Oncology

## 2016-09-18 DIAGNOSIS — C50212 Malignant neoplasm of upper-inner quadrant of left female breast: Secondary | ICD-10-CM

## 2016-09-18 DIAGNOSIS — N6322 Unspecified lump in the left breast, upper inner quadrant: Secondary | ICD-10-CM | POA: Diagnosis not present

## 2016-09-18 DIAGNOSIS — N6311 Unspecified lump in the right breast, upper outer quadrant: Secondary | ICD-10-CM | POA: Diagnosis not present

## 2016-09-18 DIAGNOSIS — Z171 Estrogen receptor negative status [ER-]: Principal | ICD-10-CM

## 2016-09-18 DIAGNOSIS — N6312 Unspecified lump in the right breast, upper inner quadrant: Secondary | ICD-10-CM | POA: Diagnosis not present

## 2016-09-18 MED ORDER — GADOBENATE DIMEGLUMINE 529 MG/ML IV SOLN
18.0000 mL | Freq: Once | INTRAVENOUS | Status: AC | PRN
  Administered 2016-09-18: 18 mL via INTRAVENOUS

## 2016-09-18 NOTE — Telephone Encounter (Signed)
sw pt to confirm 6/13 appt at 0800 per sch msg

## 2016-09-19 ENCOUNTER — Encounter (HOSPITAL_COMMUNITY)
Admission: RE | Admit: 2016-09-19 | Discharge: 2016-09-19 | Disposition: A | Discharge status: Home / Self Care | Payer: Medicare HMO | Source: Ambulatory Visit | Attending: General Surgery | Admitting: General Surgery

## 2016-09-19 ENCOUNTER — Encounter (HOSPITAL_COMMUNITY): Payer: Self-pay

## 2016-09-19 ENCOUNTER — Encounter: Payer: Self-pay | Admitting: Physical Therapy

## 2016-09-19 DIAGNOSIS — I2510 Atherosclerotic heart disease of native coronary artery without angina pectoris: Secondary | ICD-10-CM | POA: Diagnosis not present

## 2016-09-19 DIAGNOSIS — C770 Secondary and unspecified malignant neoplasm of lymph nodes of head, face and neck: Secondary | ICD-10-CM | POA: Diagnosis not present

## 2016-09-19 DIAGNOSIS — Z7982 Long term (current) use of aspirin: Secondary | ICD-10-CM | POA: Diagnosis not present

## 2016-09-19 DIAGNOSIS — I700 Atherosclerosis of aorta: Secondary | ICD-10-CM | POA: Diagnosis not present

## 2016-09-19 DIAGNOSIS — C7951 Secondary malignant neoplasm of bone: Secondary | ICD-10-CM | POA: Diagnosis not present

## 2016-09-19 DIAGNOSIS — R918 Other nonspecific abnormal finding of lung field: Secondary | ICD-10-CM | POA: Diagnosis not present

## 2016-09-19 DIAGNOSIS — K838 Other specified diseases of biliary tract: Secondary | ICD-10-CM | POA: Diagnosis not present

## 2016-09-19 DIAGNOSIS — Z79899 Other long term (current) drug therapy: Secondary | ICD-10-CM | POA: Diagnosis not present

## 2016-09-19 DIAGNOSIS — R590 Localized enlarged lymph nodes: Secondary | ICD-10-CM | POA: Diagnosis not present

## 2016-09-19 DIAGNOSIS — Z7984 Long term (current) use of oral hypoglycemic drugs: Secondary | ICD-10-CM | POA: Diagnosis not present

## 2016-09-19 DIAGNOSIS — Z171 Estrogen receptor negative status [ER-]: Secondary | ICD-10-CM | POA: Diagnosis not present

## 2016-09-19 DIAGNOSIS — D259 Leiomyoma of uterus, unspecified: Secondary | ICD-10-CM | POA: Diagnosis not present

## 2016-09-19 DIAGNOSIS — R160 Hepatomegaly, not elsewhere classified: Secondary | ICD-10-CM | POA: Diagnosis not present

## 2016-09-19 DIAGNOSIS — C50212 Malignant neoplasm of upper-inner quadrant of left female breast: Secondary | ICD-10-CM | POA: Diagnosis not present

## 2016-09-19 HISTORY — DX: Dyspnea, unspecified: R06.00

## 2016-09-19 HISTORY — DX: Respiratory tuberculosis unspecified: A15.9

## 2016-09-19 HISTORY — DX: Other specified postprocedural states: Z98.890

## 2016-09-19 HISTORY — DX: Chronic obstructive pulmonary disease, unspecified: J44.9

## 2016-09-19 HISTORY — DX: Malignant (primary) neoplasm, unspecified: C80.1

## 2016-09-19 HISTORY — DX: Other specified postprocedural states: R11.2

## 2016-09-19 LAB — CBC
HCT: 34.5 % — ABNORMAL LOW (ref 36.0–46.0)
HEMOGLOBIN: 10.4 g/dL — AB (ref 12.0–15.0)
MCH: 21.4 pg — AB (ref 26.0–34.0)
MCHC: 30.1 g/dL (ref 30.0–36.0)
MCV: 71 fL — ABNORMAL LOW (ref 78.0–100.0)
Platelets: 485 10*3/uL — ABNORMAL HIGH (ref 150–400)
RBC: 4.86 MIL/uL (ref 3.87–5.11)
RDW: 18.4 % — ABNORMAL HIGH (ref 11.5–15.5)
WBC: 12.7 10*3/uL — ABNORMAL HIGH (ref 4.0–10.5)

## 2016-09-19 LAB — BASIC METABOLIC PANEL
ANION GAP: 11 (ref 5–15)
BUN: 22 mg/dL — ABNORMAL HIGH (ref 6–20)
CO2: 26 mmol/L (ref 22–32)
Calcium: 9.6 mg/dL (ref 8.9–10.3)
Chloride: 100 mmol/L — ABNORMAL LOW (ref 101–111)
Creatinine, Ser: 1.12 mg/dL — ABNORMAL HIGH (ref 0.44–1.00)
GFR calc Af Amer: 54 mL/min — ABNORMAL LOW (ref >60)
GFR calc non Af Amer: 46 mL/min — ABNORMAL LOW (ref >60)
GLUCOSE: 127 mg/dL — AB (ref 65–99)
Potassium: 4.3 mmol/L (ref 3.5–5.1)
Sodium: 137 mmol/L (ref 135–145)

## 2016-09-19 LAB — GLUCOSE, CAPILLARY: GLUCOSE-CAPILLARY: 121 mg/dL — AB (ref 65–99)

## 2016-09-19 NOTE — Pre-Procedure Instructions (Signed)
CLAYTON JARMON  09/19/2016      [place] ([place]), [place] [address] [place] [address] Phone: [phone] Fax: [phone]    Your procedure is scheduled on 09-24-2016  Tuesday   Report to [facility] Admitting at 5:30 A.M.   Call this number if you have problems the morning of surgery:  [phone]   Remember:  Do not eat food or drink liquids after midnight.   Take these medicines the morning of surgery with A SIP OF WATER Amlodipine(Norvasc),Loperamide(Imodium) if needed,metoprolol(Toprol XL),   STOP ASPIRIN,ANTIINFLAMATORIES (IBUPROFEN,ALEVE,MOTRIN,ADVIL,GOODY'S POWDERS),HERBAL SUPPLEMENTS,FISH OIL,AND VITAMINS 5-7 DAYS PRIOR TO SURGERY      How to Manage Your Diabetes Before and After Surgery  Why is it important to control my blood sugar before and after surgery? . Improving blood sugar levels before and after surgery helps healing and can limit problems. . A way of improving blood sugar control is eating a healthy diet by: o  Eating less sugar and carbohydrates o  Increasing activity/exercise o  Talking with your doctor about reaching your blood sugar goals . High blood sugars (greater than 180 mg/dL) can raise your risk of infections and slow your recovery, so you will need to focus on controlling your diabetes during the weeks before surgery. . Make sure that the doctor who takes care of your diabetes knows about your planned surgery including the date and location.  How do I manage my blood sugar before surgery? . Check your blood sugar at least 4 times a day, starting 2 days before surgery, to make sure that the level is not too high or low. o Check your blood sugar the morning of your surgery when you wake up and every 2 hours until you get to the Short Stay unit. . If your blood sugar is less than 70 mg/dL, you will need to treat for low blood sugar: o Do not take insulin. o Treat  a low blood sugar (less than 70 mg/dL) with  cup of clear juice (cranberry or apple), 4 glucose tablets, OR glucose gel. o Recheck blood sugar in 15 minutes after treatment (to make sure it is greater than 70 mg/dL). If your blood sugar is not greater than 70 mg/dL on recheck, call [phone] for further instructions. . Report your blood sugar to the short stay nurse when you get to Short Stay.  . If you are admitted to the hospital after surgery: o Your blood sugar will be checked by the staff and you will probably be given insulin after surgery (instead of oral diabetes medicines) to make sure you have good blood sugar levels. o The goal for blood sugar control after surgery is 80-180 mg/dL.              WHAT DO I DO ABOUT MY DIABETES MEDICATION?   Marland Kitchen Do not take oral diabetes medicines (pills) the morning of surgery   Dat   Reviewed and Endorsed by [facility] Patient Education Committee, August 2015   Do not wear jewelry, make-up or nail polish.  Do not wear lotions, powders, or perfumes, or deoderant.  Do not shave 48 hours prior to surgery.  Men may shave face and neck .  Do not bring valuables to the hospital.   [facility] is not responsible for any belongings or valuables.  Contacts, dentures or bridgework may not be worn into surgery.  Leave your suitcase in the  car.  After surgery it may be brought to your room.  For patients admitted to the hospital, discharge time will be determined by your treatment team.  Patients discharged the day of surgery will not be allowed to drive home.    Special Instructions: [facility] - Preparing for Surgery  Before surgery, you can play an important role.  Because skin is not sterile, your skin needs to be as free of germs as possible.  You can reduce the number of germs on you skin by washing with CHG (chlorahexidine gluconate) soap before surgery.  CHG is an antiseptic cleaner which kills germs and bonds with the skin to  continue killing germs even after washing.  Please DO NOT use if you have an allergy to CHG or antibacterial soaps.  If your skin becomes reddened/irritated stop using the CHG and inform your nurse when you arrive at Short Stay.  Do not shave (including legs and underarms) for at least 48 hours prior to the first CHG shower.  You may shave your face.  Please follow these instructions carefully:   1.  Shower with CHG Soap the night before surgery and the   morning of Surgery.  2.  If you choose to wash your hair, wash your hair first as usual with your normal shampoo.  3.  After you shampoo, rinse your hair and body thoroughly to remove the  Shampoo.  4.  Use CHG as you would any other liquid soap.  You can apply chg directly  to the skin and wash gently with scrungie or a clean washcloth.  5.  Apply the CHG Soap to your body ONLY FROM THE NECK DOWN.   Do not use on open wounds or open sores.  Avoid contact with your eyes,  ears, mouth and genitals (private parts).  Wash genitals (private parts) with your normal soap.  6.  Wash thoroughly, paying special attention to the area where your surgery will be performed.  7.  Thoroughly rinse your body with warm water from the neck down.  8.  DO NOT shower/wash with your normal soap after using and rinsing o  the CHG Soap.  9.  Pat yourself dry with a clean towel.            10.  Wear clean pajamas.            11.  Place clean sheets on your bed the night of your first shower and do not sleep with pets.  Day of Surgery  Do not apply any lotions/deodorants the morning of surgery.  Please wear clean clothes to the hospital/surgery center.   Please read over the following fact sheets that you were given. Surgical Site Infection Prevention

## 2016-09-20 ENCOUNTER — Encounter (HOSPITAL_COMMUNITY)
Admission: RE | Admit: 2016-09-20 | Discharge: 2016-09-20 | Disposition: A | Discharge status: Home / Self Care | Payer: Medicare HMO | Source: Ambulatory Visit | Attending: Hematology and Oncology | Admitting: Hematology and Oncology

## 2016-09-20 ENCOUNTER — Encounter (HOSPITAL_COMMUNITY): Admission: RE | Admit: 2016-09-20 | Payer: Medicare HMO | Source: Ambulatory Visit

## 2016-09-20 ENCOUNTER — Ambulatory Visit (HOSPITAL_COMMUNITY)
Admission: RE | Admit: 2016-09-20 | Discharge: 2016-09-20 | Disposition: A | Discharge status: Home / Self Care | Payer: Medicare HMO | Source: Ambulatory Visit | Attending: Hematology and Oncology | Admitting: Hematology and Oncology

## 2016-09-20 ENCOUNTER — Encounter (HOSPITAL_COMMUNITY): Payer: Self-pay

## 2016-09-20 DIAGNOSIS — C7951 Secondary malignant neoplasm of bone: Secondary | ICD-10-CM | POA: Diagnosis not present

## 2016-09-20 DIAGNOSIS — Z7984 Long term (current) use of oral hypoglycemic drugs: Secondary | ICD-10-CM | POA: Diagnosis not present

## 2016-09-20 DIAGNOSIS — C50212 Malignant neoplasm of upper-inner quadrant of left female breast: Secondary | ICD-10-CM | POA: Insufficient documentation

## 2016-09-20 DIAGNOSIS — I2510 Atherosclerotic heart disease of native coronary artery without angina pectoris: Secondary | ICD-10-CM | POA: Insufficient documentation

## 2016-09-20 DIAGNOSIS — Z79899 Other long term (current) drug therapy: Secondary | ICD-10-CM | POA: Diagnosis not present

## 2016-09-20 DIAGNOSIS — C770 Secondary and unspecified malignant neoplasm of lymph nodes of head, face and neck: Secondary | ICD-10-CM | POA: Insufficient documentation

## 2016-09-20 DIAGNOSIS — R918 Other nonspecific abnormal finding of lung field: Secondary | ICD-10-CM | POA: Diagnosis not present

## 2016-09-20 DIAGNOSIS — Z171 Estrogen receptor negative status [ER-]: Secondary | ICD-10-CM | POA: Diagnosis not present

## 2016-09-20 DIAGNOSIS — Z7982 Long term (current) use of aspirin: Secondary | ICD-10-CM | POA: Insufficient documentation

## 2016-09-20 DIAGNOSIS — C50912 Malignant neoplasm of unspecified site of left female breast: Secondary | ICD-10-CM | POA: Diagnosis not present

## 2016-09-20 DIAGNOSIS — D259 Leiomyoma of uterus, unspecified: Secondary | ICD-10-CM | POA: Insufficient documentation

## 2016-09-20 DIAGNOSIS — I700 Atherosclerosis of aorta: Secondary | ICD-10-CM | POA: Insufficient documentation

## 2016-09-20 DIAGNOSIS — K838 Other specified diseases of biliary tract: Secondary | ICD-10-CM | POA: Insufficient documentation

## 2016-09-20 DIAGNOSIS — R590 Localized enlarged lymph nodes: Secondary | ICD-10-CM | POA: Diagnosis not present

## 2016-09-20 DIAGNOSIS — R160 Hepatomegaly, not elsewhere classified: Secondary | ICD-10-CM | POA: Diagnosis not present

## 2016-09-20 LAB — HEMOGLOBIN A1C
HEMOGLOBIN A1C: 6.7 % — AB (ref 4.8–5.6)
Mean Plasma Glucose: 146 mg/dL

## 2016-09-20 MED ORDER — IOPAMIDOL (ISOVUE-300) INJECTION 61%
100.0000 mL | Freq: Once | INTRAVENOUS | Status: AC | PRN
  Administered 2016-09-20: 100 mL via INTRAVENOUS

## 2016-09-20 MED ORDER — TECHNETIUM TC 99M MEDRONATE IV KIT: 25.0000 | PACK | Freq: Once | INTRAVENOUS | Status: DC | PRN

## 2016-09-20 MED ORDER — IOPAMIDOL (ISOVUE-300) INJECTION 61%
INTRAVENOUS | Status: AC
  Filled 2016-09-20: qty 100

## 2016-09-23 NOTE — Anesthesia Preprocedure Evaluation (Addendum)
Anesthesia Evaluation  Patient identified by MRN, date of birth, ID band Patient awake    Reviewed: Allergy & Precautions, NPO status , Patient's Chart, lab work & pertinent test results  History of Anesthesia Complications (+) PONV and history of anesthetic complications  Airway Mallampati: II  TM Distance: >3 FB Neck ROM: Full    Dental  (+) Missing,    Pulmonary COPD (mild, controlled), former smoker,    Pulmonary exam normal breath sounds clear to auscultation       Cardiovascular hypertension, Pt. on medications and Pt. on home beta blockers Normal cardiovascular exam Rhythm:Regular Rate:Normal  ECG: NSR, rate 78   Neuro/Psych Anxiety Depression negative neurological ROS     GI/Hepatic negative GI ROS, Neg liver ROS,   Endo/Other  diabetes, Oral Hypoglycemic Agents  Renal/GU negative Renal ROS  negative genitourinary   Musculoskeletal negative musculoskeletal ROS (+)   Abdominal (+) + obese,   Peds negative pediatric ROS (+)  Hematology  (+) anemia ,   Anesthesia Other Findings Hyperlipidemia obese  Reproductive/Obstetrics negative OB ROS                            Anesthesia Physical Anesthesia Plan  ASA: III  Anesthesia Plan: General   Post-op Pain Management:    Induction: Intravenous  PONV Risk Score and Plan: 4 or greater and Ondansetron, Dexamethasone, Propofol and Midazolam  Airway Management Planned: LMA  Additional Equipment:   Intra-op Plan:   Post-operative Plan: Extubation in OR  Informed Consent: I have reviewed the patients History and Physical, chart, labs and discussed the procedure including the risks, benefits and alternatives for the proposed anesthesia with the patient or authorized representative who has indicated his/her understanding and acceptance.   Dental advisory given  Plan Discussed with: CRNA  Anesthesia Plan Comments:         Anesthesia Quick Evaluation

## 2016-09-23 NOTE — H&P (Signed)
Laurie Collins 09/11/2016 7:35 AM Location: [facility] Patient #: [id number] DOB: 04-Dec-1940 Unknown / Language: Laurie Collins / Race: White Female   History of Present Illness Laurie Klein MD; 09/11/2016 12:03 PM) The patient is a 76 year old female who presents with breast cancer. Pt is a 55 rolled female who presented with palpable breast masses bilaterally. She has felt them for approximately 4 months. She underwent diagnostic imaging which showed a 6-7 cm mass in the left upper inner quadrant with multiple suspicious axillary lymph nodes. 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 The right breast showed 2 lesions with one at 12:30 measuring 5 mm and one at 1:00 measuring 1.4 cm. These were biopsied and showed invasive mammary carcinoma, grade 2, lobular phenotype, ER/PR positive, HER-2 negative.  The patient has not had a mammogram for approximately 8 years. She is a diabetic. She has had previous lumpectomies for benign disease. She had a mother with some type of cancer in her chest. She is from [place] and came down to [place] to help her son with her granddaughter. She is a former smoker. She does not drink alcohol or drugs. Menarche was at age 73. Menopause was approximately age 17. She did not use hormonal contraceptives or hormone replacement. She had 2 children with the first at age 30. She has not ever had a colonoscopy but has had a bone density. She did get her first mammogram at age 83.  Mm Diag Breast Tomo Bilateral with ultrasound.  Result Date: 08/28/2016 CLINICAL DATA: 76 year old female presenting for new baseline mammogram for evaluation of palpable masses in the bilateral breasts. EXAM: 2D DIGITAL DIAGNOSTIC BILATERAL MAMMOGRAM WITH CAD AND ADJUNCT TOMO BILATERAL BREAST ULTRASOUND COMPARISON: None. ACR Breast Density Category b: There are scattered areas of fibroglandular density. FINDINGS: A  BB has been placed on the upper-outer quadrant of the left breast indicating a palpable site of concern. There is a large lobulated mass with indistinct margins deep to the palpable marker measuring approximately 7.4 cm. There is a partially visualized probable abnormal lymph node in the left axilla. The palpable marker in the right breast has been placed on the superior aspect. There is a possible spiculated mass in the tissue deep to the palpable marker. Additionally, anterior to the palpable marker in the superior right breast, middle depth there is a suspicious spiculated mass. In the medial aspect of the right breast, middle depth there is a 1.0 cm group of calcifications which appears linearly oriented on the CC view, however it spreads out on the lateral views and does not maintain the suspicious linear distribution. 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 Mammographic images were processed with CAD. There is a large protruding palpable mass in the upper-outer quadrant of the left breast at the patient's palpable site of concern. In the superior right breast there is a subtle nodularity to the tissue in the area of concern. Ultrasound of the palpable site in the left breast at 1130, 10 cm from the nipple is a large irregular hypoechoic mass measuring 6.3 x 6.1 x 5.2 cm. Internal vascularity is identified on color Doppler imaging. Ultrasound of the left axilla demonstrates 3 abnormally enlarged lymph nodes with cortex measuring up to 1.5 cm. Ultrasound targeted to the palpable site in the superior right breast at 1 o'clock, 10 cm from the nipple demonstrates an irregular hypoechoic  mass measuring 1.4 x 0.8 x 0.9 cm. At a site slightly more lateral in closer to the nipple at 12:30, 8 cm from the nipple there is a superficial area of shadowing measuring 5 x 3 x 3 mm. At 12:30, 3 cm from the nipple there is  another irregular hypoechoic mass measuring 1.3 x 0.8 x 0.9 cm. In the right breast at 1 o'clock, 4 cm from the nipple there is an oval mass with indistinct somewhat angular margins measuring 8 x 7 x 7 mm. No evidence of right axillary lymphadenopathy. IMPRESSION: 1. The palpable mass in the left breast at 11:30 measures up to 7.4 cm mammographically and up to 6.3 cm by ultrasound. This is highly suspicious for malignancy. 2. There are 3 abnormally enlarged left axillary lymph nodes identified by ultrasound. 3. There are multiple suspicious masses identified in the superior and upper inner quadrant of the right breast, 1 of which corresponds with the palpable site identified by the patient. The largest of these masses measures 1.4 cm. 4. No evidence of right axillary lymphadenopathy. RECOMMENDATION: 1. Ultrasound-guided biopsy is recommended for 2 sites in the right breast; including the palpable site at 1 o'clock, 10 cm from the nipple, and the spiculated mass at 12:30, 3 cm from the nipple. 2. Ultrasound-guided biopsy is recommended for the palpable left breast mass at 11:30, and for 1 of the abnormal left axillary lymph nodes. These for procedures have been scheduled for05/21/2018 at 10:30 a.m. The patient is very nervous about the procedures. I feel confident I can help her get through the procedures, however given her level of anxiety she may be a good candidate for anxiolysis. I have discussed the findings and recommendations with the patient. Results were also provided in writing at the conclusion of the visit. If applicable, a reminder letter will be sent to the patient regarding the next appointment. BI-RADS CATEGORY 5: Highly suggestive of malignancy. Electronically Signed By: [name] On: 08/28/2016 15:24  Pathology Diagnosis 1. Breast, left, needle core biopsy, 11:30 o'clock, triple negative, Ki67 90% - INVASIVE DUCTAL CARCINOMA. - LYMPHOVASCULAR INVASION IS IDENTIFIED. - SEE  COMMENT. 2. Lymph node, needle/core biopsy, left axillary - METASTATIC CARCINOMA IN 1 OF 1 LYMPH NODE (1/1). 3. Breast, right, needle core biopsy, 12:30 o'clock, lobular +/+/- - INVASIVE MAMMARY CARICNOMA. - SEE COMMENT. 4. Breast, right, needle core biopsy, 1:00 o'clock - INVASIVE MAMMARY CARCINOMA. - SEE COMMENT.   Recent Results (from the past 2160 hour(s)) CBC with Differential/Platelet Status: Abnormal Collection Time: 09/11/16 9:15 AM Result Value Ref Range WBC 12.6 (H) 3.9 - 10.3 10e3/uL NEUT# 7.7 (H) 1.5 - 6.5 10e3/uL HGB 10.3 (L) 11.6 - 15.9 g/dL HCT 34.1 (L) 34.8 - 46.6 % Platelets 438 (H) 145 - 400 10e3/uL MCV 72.4 (L) 79.5 - 101.0 fL MCH 21.9 (L) 25.1 - 34.0 pg MCHC 30.2 (L) 31.5 - 36.0 g/dL RBC 4.71 3.70 - 5.45 10e6/uL RDW 18.5 (H) 11.2 - 14.5 % lymph# 3.7 (H) 0.9 - 3.3 10e3/uL MONO# 0.9 0.1 - 0.9 10e3/uL Eosinophils Absolute 0.3 0.0 - 0.5 10e3/uL Basophils Absolute 0.0 0.0 - 0.1 10e3/uL NEUT% 60.6 38.4 - 76.8 % LYMPH% 29.4 14.0 - 49.7 % MONO% 7.3 0.0 - 14.0 % EOS% 2.5 0.0 - 7.0 % BASO% 0.2 0.0 - 2.0 % Comprehensive metabolic panel Status: Abnormal Collection Time: 09/11/16 9:15 AM Result Value Ref Range Sodium 139 136 - 145 mEq/L Potassium 4.3 3.5 - 5.1 mEq/L Chloride 101 98 - 109 mEq/L CO2 27 22 -  29 mEq/L Glucose 114 70 - 140 mg/dl Comment: Glucose reference range is for nonfasting patients. Fasting glucose reference range is 70- 100. BUN 21.2 7.0 - 26.0 mg/dL Creatinine 0.9 0.6 - 1.1 mg/dL Total Bilirubin 0.30 0.20 - 1.20 mg/dL Alkaline Phosphatase 88 40 - 150 U/L AST 69 (H) 5 - 34 U/L ALT 63 (H) 0 - 55 U/L Total Protein 8.8 (H) 6.4 - 8.3 g/dL Albumin 3.5 3.5 - 5.0 g/dL Calcium 10.0 8.4 - 10.4 mg/dL Anion Gap 11 3 - 11 mEq/L EGFR 61 (L) >90 ml/min/1.73 m2 Comment: eGFR is calculated using the CKD-EPI Creatinine Equation (2009)      Past Surgical History (Sylvia Ledford, RN; 09/11/2016 7:35 AM) Breast Biopsy  Bilateral.  multiple Breast Mass; Local Excision  Bilateral. Oral Surgery  Tonsillectomy   Diagnostic Studies History (Sylvia Ledford, RN; 09/11/2016 7:35 AM) Colonoscopy  never Mammogram  within last year Pap Smear  >5 years ago  Medication History (Sylvia Ledford, RN; 09/11/2016 7:36 AM) Medications Reconciled  Social History (Sylvia Ledford, RN; 09/11/2016 7:35 AM) Alcohol use  Occasional alcohol use. Caffeine use  Coffee. No drug use  Tobacco use  Former smoker.  Family History (Sylvia Ledford, RN; 09/11/2016 7:35 AM) Alcohol Abuse  Brother, Father. Arthritis  Brother. Breast Cancer  Family Members In General, Mother. Colon Polyps  Brother, Father. Depression  Father. Diabetes Mellitus  Sister. Heart Disease  Brother, Father, Mother. Hypertension  Brother. Kidney Disease  Brother, Father, Mother, Son. Respiratory Condition  Brother, Father, Son. Seizure disorder  Brother. Thyroid problems  Mother.  Pregnancy / Birth History (Sylvia Ledford, RN; 09/11/2016 7:35 AM) Age at menarche  13 years. Age of menopause  51-55 Contraceptive History  Intrauterine device. Gravida  3 Irregular periods  Maternal age  21-25 Para  2  Other Problems (Sylvia Ledford, RN; 09/11/2016 7:35 AM) Anxiety Disorder  Back Pain  Breast Cancer  Chronic Obstructive Lung Disease  Diabetes Mellitus  General anesthesia - complications  Hemorrhoids  High blood pressure  Hypercholesterolemia  Lump In Breast  Transfusion history  Ulcerative Colitis     Review of Systems (Sylvia Ledford RN; 09/11/2016 7:36 AM) General Not Present- Appetite Loss, Chills, Fatigue, Fever, Night Sweats, Weight Gain and Weight Loss. HEENT Present- Wears glasses/contact lenses. Not Present- Earache, Hearing Loss, Hoarseness, Nose Bleed, Oral Ulcers, Ringing in the Ears, Seasonal Allergies, Sinus Pain, Sore Throat, Visual Disturbances and Yellow Eyes. Respiratory Present- Difficulty  Breathing. Not Present- Bloody sputum, Chronic Cough, Snoring and Wheezing. Breast Present- Breast Mass. Not Present- Breast Pain, Nipple Discharge and Skin Changes. Cardiovascular Present- Leg Cramps and Shortness of Breath. Not Present- Chest Pain, Difficulty Breathing Lying Down, Palpitations, Rapid Heart Rate and Swelling of Extremities. Gastrointestinal Not Present- Abdominal Pain, Bloating, Bloody Stool, Change in Bowel Habits, Chronic diarrhea, Constipation, Difficulty Swallowing, Excessive gas, Gets full quickly at meals, Hemorrhoids, Indigestion, Nausea, Rectal Pain and Vomiting. Female Genitourinary Present- Frequency. Not Present- Nocturia, Painful Urination, Pelvic Pain and Urgency. Musculoskeletal Present- Back Pain. Not Present- Joint Pain, Joint Stiffness, Muscle Pain, Muscle Weakness and Swelling of Extremities. Neurological Not Present- Decreased Memory, Fainting, Headaches, Numbness, Seizures, Tingling, Tremor, Trouble walking and Weakness. Psychiatric Not Present- Anxiety, Bipolar, Change in Sleep Pattern, Depression, Fearful and Frequent crying. Endocrine Not Present- Cold Intolerance, Excessive Hunger, Hair Changes, Heat Intolerance, Hot flashes and New Diabetes. Hematology Not Present- Blood Thinners, Easy Bruising, Excessive bleeding, Gland problems, HIV and Persistent Infections.   Physical Exam (Spyros Winch MD; 09/11/2016 12:06 PM) General Mental Status-Alert. General   Appearance-Consistent with stated age. Hydration-Well hydrated. Voice-Normal.  Head and Neck Head-normocephalic, atraumatic with no lesions or palpable masses. Trachea-midline. Thyroid Gland Characteristics - normal size and consistency.  Eye Eyeball - Bilateral-Extraocular movements intact. Sclera/Conjunctiva - Bilateral-No scleral icterus.  Chest and Lung Exam Chest and lung exam reveals -quiet, even and easy respiratory effort with no use of accessory muscles and on  auscultation, normal breath sounds, no adventitious sounds and normal vocal resonance. Inspection Chest Wall - Normal. Back - normal.  Breast Note: large palpable breast mass at 11-12 o'clock approximately 6 cm in diameter, visually deforming the upper breast. somewhat mobile. + palpable LN on left. no nipple retraction. Right breast with vaguely palpable mass at 1 o'clock, around 1-2 cm. no LAD, no nipple retraction, no skin dimpling.   Cardiovascular Cardiovascular examination reveals -normal heart sounds, regular rate and rhythm with no murmurs and normal pedal pulses bilaterally.  Abdomen Inspection Inspection of the abdomen reveals - No Hernias. Palpation/Percussion Palpation and Percussion of the abdomen reveal - Soft, Non Tender, No Rebound tenderness, No Rigidity (guarding) and No hepatosplenomegaly. Auscultation Auscultation of the abdomen reveals - Bowel sounds normal.  Neurologic Neurologic evaluation reveals -alert and oriented x 3 with no impairment of recent or remote memory. Mental Status-Normal.  Musculoskeletal Global Assessment -Note: no gross deformities.  Normal Exam - Left-Upper Extremity Strength Normal and Lower Extremity Strength Normal. Normal Exam - Right-Upper Extremity Strength Normal and Lower Extremity Strength Normal.  Lymphatic Head & Neck  General Head & Neck Lymphatics: Bilateral - Description - Normal. Axillary  General Axillary Region: Bilateral - Description - Normal. Tenderness - Non Tender. Femoral & Inguinal  Generalized Femoral & Inguinal Lymphatics: Bilateral - Description - No Generalized lymphadenopathy.    Assessment & Plan (Kailo Kosik MD; 09/11/2016 12:13 PM) PRIMARY CANCER OF UPPER INNER QUADRANT OF LEFT FEMALE BREAST (C50.212) Impression: Pt has a new diagnosis of cT2N1 left breast cancer, uper inner quadrant. This is definitely the dominant cancer that will drive survival. I will set her up for neoadjuvant  chemotherapy. We will place a Port-A-Cath. I reviewed the risks of surgery including bleeding, infection, pneumothorax, possible need for replacement or repositioning, possible need for removal, and other. I showed her a model of a Port-A-Cath. We will do this in approximately 1-2 weeks and have her left accessed for chemotherapy.  Hopefully she will have a significant response and will be a candidate for a lumpectomy. Depending on what is going on with her lymph nodes on the MRI and post neoadjuvant treatment, she will either need a targeted lymph node dissection with sentinel node mapping or an axillary lymph node dissection. She also will need staging scans that will be set up by oncology.  Because of the location of the tumor, she may be a candidate for lumpectomy even without a significant response. However given her high Ki-67, hopefully we will be able to downstage her in order to minimize her surgery. She will need adjuvant radiation following surgery. Current Plans You are being scheduled for surgery- Our schedulers will call you.  You should hear from our office's scheduling department within 5 working days about the location, date, and time of surgery. We try to make accommodations for patient's preferences in scheduling surgery, but sometimes the OR schedule or the surgeon's schedule prevents us from making those accommodations.  If you have not heard from our office ([phone]) in 5 working days, call the office and ask for your surgeon's nurse.  If you have   other questions about your diagnosis, plan, or surgery, call the office and ask for your surgeon's nurse.  Pt Education - ccs port insertion education MRI, BOTH BREASTS [phone]) (Already ordered in breast clinic.) PRIMARY CANCER OF UPPER OUTER QUADRANT OF RIGHT FEMALE BREAST (C50.411) Impression: Based on the multifocal nature of this cancer, she might be a candidate for a lumpectomy depending on her clip films and her MRI.  There are 2 lesions that are 4 cm apart. These may be able to be bracketed. This cancer will still need a sentinel lymph node biopsy and anti-hormone treatment. This does not appear to be the driving cancer for her treatment.  55 min spent in evaluation, examination, counseling, and coordination of care. >50% spent in counseling.    Signed by Laurie Klein, MD (09/11/2016 12:14 PM)

## 2016-09-24 ENCOUNTER — Ambulatory Visit (HOSPITAL_COMMUNITY): Payer: Medicare HMO | Admitting: Anesthesiology

## 2016-09-24 ENCOUNTER — Ambulatory Visit (HOSPITAL_COMMUNITY)
Admission: RE | Admit: 2016-09-24 | Discharge: 2016-09-24 | Disposition: A | Discharge status: Home / Self Care | Payer: Medicare HMO | Source: Ambulatory Visit | Attending: General Surgery | Admitting: General Surgery

## 2016-09-24 ENCOUNTER — Encounter (HOSPITAL_COMMUNITY): Payer: Self-pay

## 2016-09-24 ENCOUNTER — Ambulatory Visit (HOSPITAL_COMMUNITY): Payer: Medicare HMO

## 2016-09-24 ENCOUNTER — Encounter (HOSPITAL_COMMUNITY)
Admission: RE | Disposition: A | Discharge status: Home / Self Care | Payer: Self-pay | Source: Ambulatory Visit | Attending: General Surgery

## 2016-09-24 DIAGNOSIS — Z841 Family history of disorders of kidney and ureter: Secondary | ICD-10-CM | POA: Insufficient documentation

## 2016-09-24 DIAGNOSIS — C773 Secondary and unspecified malignant neoplasm of axilla and upper limb lymph nodes: Secondary | ICD-10-CM | POA: Insufficient documentation

## 2016-09-24 DIAGNOSIS — F419 Anxiety disorder, unspecified: Secondary | ICD-10-CM | POA: Diagnosis not present

## 2016-09-24 DIAGNOSIS — Z87891 Personal history of nicotine dependence: Secondary | ICD-10-CM | POA: Diagnosis not present

## 2016-09-24 DIAGNOSIS — Z7984 Long term (current) use of oral hypoglycemic drugs: Secondary | ICD-10-CM | POA: Diagnosis not present

## 2016-09-24 DIAGNOSIS — Z818 Family history of other mental and behavioral disorders: Secondary | ICD-10-CM | POA: Diagnosis not present

## 2016-09-24 DIAGNOSIS — C50911 Malignant neoplasm of unspecified site of right female breast: Secondary | ICD-10-CM | POA: Insufficient documentation

## 2016-09-24 DIAGNOSIS — E7800 Pure hypercholesterolemia, unspecified: Secondary | ICD-10-CM | POA: Diagnosis not present

## 2016-09-24 DIAGNOSIS — Z9889 Other specified postprocedural states: Secondary | ICD-10-CM | POA: Diagnosis not present

## 2016-09-24 DIAGNOSIS — Z170 Estrogen receptor positive status [ER+]: Secondary | ICD-10-CM | POA: Diagnosis not present

## 2016-09-24 DIAGNOSIS — C50312 Malignant neoplasm of lower-inner quadrant of left female breast: Secondary | ICD-10-CM | POA: Diagnosis present

## 2016-09-24 DIAGNOSIS — R599 Enlarged lymph nodes, unspecified: Secondary | ICD-10-CM | POA: Insufficient documentation

## 2016-09-24 DIAGNOSIS — Z811 Family history of alcohol abuse and dependence: Secondary | ICD-10-CM | POA: Insufficient documentation

## 2016-09-24 DIAGNOSIS — Z419 Encounter for procedure for purposes other than remedying health state, unspecified: Secondary | ICD-10-CM

## 2016-09-24 DIAGNOSIS — Z833 Family history of diabetes mellitus: Secondary | ICD-10-CM | POA: Insufficient documentation

## 2016-09-24 DIAGNOSIS — Z8371 Family history of colonic polyps: Secondary | ICD-10-CM | POA: Insufficient documentation

## 2016-09-24 DIAGNOSIS — Z803 Family history of malignant neoplasm of breast: Secondary | ICD-10-CM | POA: Insufficient documentation

## 2016-09-24 DIAGNOSIS — Z79899 Other long term (current) drug therapy: Secondary | ICD-10-CM | POA: Insufficient documentation

## 2016-09-24 DIAGNOSIS — C50211 Malignant neoplasm of upper-inner quadrant of right female breast: Secondary | ICD-10-CM | POA: Diagnosis not present

## 2016-09-24 DIAGNOSIS — D0512 Intraductal carcinoma in situ of left breast: Secondary | ICD-10-CM | POA: Insufficient documentation

## 2016-09-24 DIAGNOSIS — C50411 Malignant neoplasm of upper-outer quadrant of right female breast: Secondary | ICD-10-CM | POA: Diagnosis not present

## 2016-09-24 DIAGNOSIS — E119 Type 2 diabetes mellitus without complications: Secondary | ICD-10-CM | POA: Diagnosis not present

## 2016-09-24 DIAGNOSIS — E785 Hyperlipidemia, unspecified: Secondary | ICD-10-CM | POA: Insufficient documentation

## 2016-09-24 DIAGNOSIS — I119 Hypertensive heart disease without heart failure: Secondary | ICD-10-CM | POA: Diagnosis not present

## 2016-09-24 DIAGNOSIS — Z836 Family history of other diseases of the respiratory system: Secondary | ICD-10-CM | POA: Insufficient documentation

## 2016-09-24 DIAGNOSIS — Z452 Encounter for adjustment and management of vascular access device: Secondary | ICD-10-CM

## 2016-09-24 DIAGNOSIS — J449 Chronic obstructive pulmonary disease, unspecified: Secondary | ICD-10-CM | POA: Diagnosis not present

## 2016-09-24 DIAGNOSIS — Z8261 Family history of arthritis: Secondary | ICD-10-CM | POA: Diagnosis not present

## 2016-09-24 DIAGNOSIS — Z7982 Long term (current) use of aspirin: Secondary | ICD-10-CM | POA: Insufficient documentation

## 2016-09-24 DIAGNOSIS — Z820 Family history of epilepsy and other diseases of the nervous system: Secondary | ICD-10-CM | POA: Insufficient documentation

## 2016-09-24 DIAGNOSIS — Z8249 Family history of ischemic heart disease and other diseases of the circulatory system: Secondary | ICD-10-CM | POA: Insufficient documentation

## 2016-09-24 DIAGNOSIS — C50212 Malignant neoplasm of upper-inner quadrant of left female breast: Secondary | ICD-10-CM | POA: Diagnosis not present

## 2016-09-24 DIAGNOSIS — Z4682 Encounter for fitting and adjustment of non-vascular catheter: Secondary | ICD-10-CM | POA: Diagnosis not present

## 2016-09-24 DIAGNOSIS — Z8349 Family history of other endocrine, nutritional and metabolic diseases: Secondary | ICD-10-CM | POA: Insufficient documentation

## 2016-09-24 HISTORY — PX: PORTACATH PLACEMENT: SHX2246

## 2016-09-24 LAB — GLUCOSE, CAPILLARY
GLUCOSE-CAPILLARY: 135 mg/dL — AB (ref 65–99)
Glucose-Capillary: 158 mg/dL — ABNORMAL HIGH (ref 65–99)

## 2016-09-24 SURGERY — INSERTION, TUNNELED CENTRAL VENOUS DEVICE, WITH PORT
Anesthesia: General | Site: Chest | Laterality: Right

## 2016-09-24 MED ORDER — LIDOCAINE HCL 1 % IJ SOLN
INTRAMUSCULAR | Status: DC | PRN
  Administered 2016-09-24: 18 mL

## 2016-09-24 MED ORDER — MIDAZOLAM HCL 5 MG/5ML IJ SOLN
INTRAMUSCULAR | Status: DC | PRN
  Administered 2016-09-24: 2 mg via INTRAVENOUS

## 2016-09-24 MED ORDER — 0.9 % SODIUM CHLORIDE (POUR BTL) OPTIME
TOPICAL | Status: DC | PRN
  Administered 2016-09-24: 1000 mL

## 2016-09-24 MED ORDER — BUPIVACAINE-EPINEPHRINE (PF) 0.25% -1:200000 IJ SOLN
INTRAMUSCULAR | Status: AC
  Filled 2016-09-24: qty 30

## 2016-09-24 MED ORDER — MIDAZOLAM HCL 2 MG/2ML IJ SOLN
INTRAMUSCULAR | Status: AC
  Filled 2016-09-24: qty 2

## 2016-09-24 MED ORDER — LIDOCAINE 2% (20 MG/ML) 5 ML SYRINGE
INTRAMUSCULAR | Status: AC
  Filled 2016-09-24: qty 5

## 2016-09-24 MED ORDER — FENTANYL CITRATE (PF) 100 MCG/2ML IJ SOLN
INTRAMUSCULAR | Status: DC | PRN
  Administered 2016-09-24: 50 ug via INTRAVENOUS

## 2016-09-24 MED ORDER — EPHEDRINE SULFATE 50 MG/ML IJ SOLN
INTRAMUSCULAR | Status: DC | PRN
  Administered 2016-09-24 (×4): 10 mg via INTRAVENOUS

## 2016-09-24 MED ORDER — SODIUM CHLORIDE 0.9% FLUSH: 3.0000 mL | INTRAVENOUS | Status: DC | PRN

## 2016-09-24 MED ORDER — METOCLOPRAMIDE HCL 5 MG/ML IJ SOLN
INTRAMUSCULAR | Status: DC | PRN
  Administered 2016-09-24: 5 mg via INTRAVENOUS

## 2016-09-24 MED ORDER — LIDOCAINE HCL (CARDIAC) 20 MG/ML IV SOLN
INTRAVENOUS | Status: DC | PRN
  Administered 2016-09-24: 50 mg via INTRAVENOUS

## 2016-09-24 MED ORDER — PROMETHAZINE HCL 25 MG/ML IJ SOLN: 6.2500 mg | INTRAMUSCULAR | Status: DC | PRN

## 2016-09-24 MED ORDER — ACETAMINOPHEN 325 MG PO TABS: 650.0000 mg | ORAL_TABLET | ORAL | Status: DC | PRN

## 2016-09-24 MED ORDER — ROCURONIUM BROMIDE 10 MG/ML (PF) SYRINGE
PREFILLED_SYRINGE | INTRAVENOUS | Status: AC
  Filled 2016-09-24: qty 5

## 2016-09-24 MED ORDER — SODIUM CHLORIDE 0.9 % IV SOLN: 250.0000 mL | INTRAVENOUS | Status: DC | PRN

## 2016-09-24 MED ORDER — HYDROMORPHONE HCL 1 MG/ML IJ SOLN: 0.2500 mg | INTRAMUSCULAR | Status: DC | PRN

## 2016-09-24 MED ORDER — GLYCOPYRROLATE 0.2 MG/ML IJ SOLN
INTRAMUSCULAR | Status: DC | PRN
  Administered 2016-09-24: 0.2 mg via INTRAVENOUS
  Administered 2016-09-24: 0.1 mg via INTRAVENOUS
  Administered 2016-09-24: 0.2 mg via INTRAVENOUS

## 2016-09-24 MED ORDER — CIPROFLOXACIN IN D5W 400 MG/200ML IV SOLN
INTRAVENOUS | Status: DC | PRN
  Administered 2016-09-24: 400 mg via INTRAVENOUS

## 2016-09-24 MED ORDER — HEPARIN SOD (PORK) LOCK FLUSH 100 UNIT/ML IV SOLN
INTRAVENOUS | Status: AC
  Filled 2016-09-24: qty 5

## 2016-09-24 MED ORDER — OXYCODONE HCL 5 MG PO TABS: 5.0000 mg | ORAL_TABLET | Freq: Once | ORAL | Status: DC | PRN

## 2016-09-24 MED ORDER — LACTATED RINGERS IV SOLN
INTRAVENOUS | Status: DC | PRN
  Administered 2016-09-24: 07:00:00 via INTRAVENOUS

## 2016-09-24 MED ORDER — SODIUM CHLORIDE 0.9% FLUSH: 3.0000 mL | Freq: Two times a day (BID) | INTRAVENOUS | Status: DC

## 2016-09-24 MED ORDER — HEPARIN SOD (PORK) LOCK FLUSH 100 UNIT/ML IV SOLN
INTRAVENOUS | Status: DC | PRN
  Administered 2016-09-24: 500 [IU] via INTRAVENOUS

## 2016-09-24 MED ORDER — ACETAMINOPHEN 650 MG RE SUPP: 650.0000 mg | RECTAL | Status: DC | PRN

## 2016-09-24 MED ORDER — OXYCODONE HCL 5 MG PO TABS: 5.0000 mg | ORAL_TABLET | Freq: Four times a day (QID) | ORAL | 0 refills | Status: DC | PRN

## 2016-09-24 MED ORDER — LIDOCAINE HCL (PF) 1 % IJ SOLN
INTRAMUSCULAR | Status: AC
Start: 2016-09-24 — End: ?
  Filled 2016-09-24: qty 30

## 2016-09-24 MED ORDER — HEPARIN SODIUM (PORCINE) 5000 UNIT/ML IJ SOLN
INTRAMUSCULAR | Status: DC | PRN
  Administered 2016-09-24: 08:00:00 500 mL

## 2016-09-24 MED ORDER — PROPOFOL 10 MG/ML IV BOLUS
INTRAVENOUS | Status: DC | PRN
Start: 2016-09-24 — End: 2016-09-24
  Administered 2016-09-24: 150 mg via INTRAVENOUS

## 2016-09-24 MED ORDER — SCOPOLAMINE 1 MG/3DAYS TD PT72
MEDICATED_PATCH | TRANSDERMAL | Status: DC | PRN
  Administered 2016-09-24: 1 via TRANSDERMAL

## 2016-09-24 MED ORDER — OXYCODONE HCL 5 MG PO TABS: 5.0000 mg | ORAL_TABLET | ORAL | Status: DC | PRN

## 2016-09-24 MED ORDER — PHENYLEPHRINE HCL 10 MG/ML IJ SOLN
INTRAMUSCULAR | Status: DC | PRN
  Administered 2016-09-24 (×2): 80 ug via INTRAVENOUS

## 2016-09-24 MED ORDER — PROPOFOL 10 MG/ML IV BOLUS
INTRAVENOUS | Status: AC
  Filled 2016-09-24: qty 40

## 2016-09-24 MED ORDER — DEXAMETHASONE SODIUM PHOSPHATE 10 MG/ML IJ SOLN
INTRAMUSCULAR | Status: DC | PRN
  Administered 2016-09-24: 10 mg via INTRAVENOUS

## 2016-09-24 MED ORDER — SCOPOLAMINE 1 MG/3DAYS TD PT72
MEDICATED_PATCH | TRANSDERMAL | Status: AC
  Filled 2016-09-24: qty 1

## 2016-09-24 MED ORDER — ONDANSETRON HCL 4 MG/2ML IJ SOLN
INTRAMUSCULAR | Status: DC | PRN
  Administered 2016-09-24: 4 mg via INTRAVENOUS

## 2016-09-24 MED ORDER — SUCCINYLCHOLINE CHLORIDE 200 MG/10ML IV SOSY
PREFILLED_SYRINGE | INTRAVENOUS | Status: AC
  Filled 2016-09-24: qty 10

## 2016-09-24 MED ORDER — OXYCODONE HCL 5 MG/5ML PO SOLN
5.0000 mg | Freq: Once | ORAL | Status: DC | PRN
Start: 2016-09-24 — End: 2016-09-24

## 2016-09-24 MED ORDER — FENTANYL CITRATE (PF) 250 MCG/5ML IJ SOLN
INTRAMUSCULAR | Status: AC
  Filled 2016-09-24: qty 5

## 2016-09-24 MED ORDER — CIPROFLOXACIN IN D5W 400 MG/200ML IV SOLN
INTRAVENOUS | Status: AC
  Filled 2016-09-24: qty 200

## 2016-09-24 SURGICAL SUPPLY — 49 items
BAG DECANTER FOR FLEXI CONT (MISCELLANEOUS) ×3 IMPLANT
BLADE SURG 11 STRL SS (BLADE) ×3 IMPLANT
BLADE SURG 15 STRL LF DISP TIS (BLADE) ×1 IMPLANT
BLADE SURG 15 STRL SS (BLADE) ×2
CANISTER SUCT 3000ML PPV (MISCELLANEOUS) IMPLANT
CHLORAPREP W/TINT 10.5 ML (MISCELLANEOUS) ×3 IMPLANT
COVER SURGICAL LIGHT HANDLE (MISCELLANEOUS) ×3 IMPLANT
COVER TRANSDUCER ULTRASND GEL (DRAPE) IMPLANT
CRADLE DONUT ADULT HEAD (MISCELLANEOUS) ×3 IMPLANT
DECANTER SPIKE VIAL GLASS SM (MISCELLANEOUS) ×3 IMPLANT
DERMABOND ADVANCED (GAUZE/BANDAGES/DRESSINGS) ×2
DERMABOND ADVANCED .7 DNX12 (GAUZE/BANDAGES/DRESSINGS) ×1 IMPLANT
DRAPE C-ARM 42X72 X-RAY (DRAPES) ×3 IMPLANT
DRAPE CHEST BREAST 15X10 FENES (DRAPES) ×3 IMPLANT
DRAPE UTILITY XL STRL (DRAPES) ×6 IMPLANT
DRAPE WARM FLUID 44X44 (DRAPE) IMPLANT
DRSG TEGADERM 4X4.75 (GAUZE/BANDAGES/DRESSINGS) ×6 IMPLANT
ELECT COATED BLADE 2.86 ST (ELECTRODE) ×3 IMPLANT
ELECT REM PT RETURN 9FT ADLT (ELECTROSURGICAL) ×3
ELECTRODE REM PT RTRN 9FT ADLT (ELECTROSURGICAL) ×1 IMPLANT
GAUZE SPONGE 4X4 12PLY STRL LF (GAUZE/BANDAGES/DRESSINGS) ×3 IMPLANT
GAUZE SPONGE 4X4 16PLY XRAY LF (GAUZE/BANDAGES/DRESSINGS) ×3 IMPLANT
GEL ULTRASOUND 20GR AQUASONIC (MISCELLANEOUS) IMPLANT
GLOVE BIO SURGEON STRL SZ 6 (GLOVE) ×3 IMPLANT
GLOVE BIOGEL PI IND STRL 6.5 (GLOVE) ×1 IMPLANT
GLOVE BIOGEL PI INDICATOR 6.5 (GLOVE) ×2
GOWN STRL REUS W/ TWL LRG LVL3 (GOWN DISPOSABLE) ×1 IMPLANT
GOWN STRL REUS W/TWL 2XL LVL3 (GOWN DISPOSABLE) ×3 IMPLANT
GOWN STRL REUS W/TWL LRG LVL3 (GOWN DISPOSABLE) ×2
KIT BASIN OR (CUSTOM PROCEDURE TRAY) ×3 IMPLANT
KIT PORT POWER 8FR ISP CVUE (Catheter) ×3 IMPLANT
KIT ROOM TURNOVER OR (KITS) ×3 IMPLANT
NEEDLE HYPO 25GX1X1/2 BEV (NEEDLE) ×3 IMPLANT
NS IRRIG 1000ML POUR BTL (IV SOLUTION) ×3 IMPLANT
PACK SURGICAL SETUP 50X90 (CUSTOM PROCEDURE TRAY) ×3 IMPLANT
PAD ARMBOARD 7.5X6 YLW CONV (MISCELLANEOUS) ×3 IMPLANT
PENCIL BUTTON HOLSTER BLD 10FT (ELECTRODE) ×3 IMPLANT
SUT MON AB 4-0 PC3 18 (SUTURE) ×3 IMPLANT
SUT PROLENE 2 0 SH DA (SUTURE) ×6 IMPLANT
SUT VIC AB 3-0 SH 27 (SUTURE) ×2
SUT VIC AB 3-0 SH 27X BRD (SUTURE) ×1 IMPLANT
SYR 20ML ECCENTRIC (SYRINGE) ×6 IMPLANT
SYR 5ML LUER SLIP (SYRINGE) ×3 IMPLANT
SYR CONTROL 10ML LL (SYRINGE) ×3 IMPLANT
TOWEL OR 17X24 6PK STRL BLUE (TOWEL DISPOSABLE) ×3 IMPLANT
TOWEL OR 17X26 10 PK STRL BLUE (TOWEL DISPOSABLE) ×3 IMPLANT
TUBE CONNECTING 12'X1/4 (SUCTIONS)
TUBE CONNECTING 12X1/4 (SUCTIONS) IMPLANT
YANKAUER SUCT BULB TIP NO VENT (SUCTIONS) IMPLANT

## 2016-09-24 NOTE — Transfer of Care (Signed)
Immediate Anesthesia Transfer of Care Note  Patient: Laurie Collins  Procedure(s) Performed: Procedure(s): INSERTION PORT-A-CATH (Right)  Patient Location: PACU  Anesthesia Type:General  Level of Consciousness: awake and alert   Airway & Oxygen Therapy: Patient Spontanous Breathing and Patient connected to nasal cannula oxygen  Post-op Assessment: Report given to RN and Post -op Vital signs reviewed and stable  Post vital signs: Reviewed and stable  Last Vitals:  Vitals:   09/24/16 0839 09/24/16 0845  BP:  129/65  Pulse: 82 80  Resp:  10  Temp: 36.5 C     Last Pain:  Vitals:   09/24/16 0647  TempSrc: Oral         Complications: No apparent anesthesia complications

## 2016-09-24 NOTE — Anesthesia Postprocedure Evaluation (Signed)
Anesthesia Post Note  Patient: Laurie Collins  Procedure(s) Performed: Procedure(s) (LRB): INSERTION PORT-A-CATH (Right)     Patient location during evaluation: PACU Anesthesia Type: General Level of consciousness: awake and alert Pain management: pain level controlled Vital Signs Assessment: post-procedure vital signs reviewed and stable Respiratory status: spontaneous breathing, nonlabored ventilation, respiratory function stable and patient connected to nasal cannula oxygen Cardiovascular status: blood pressure returned to baseline and stable Postop Assessment: no signs of nausea or vomiting Anesthetic complications: no    Last Vitals:  Vitals:   09/24/16 0924 09/24/16 0927  BP:  (!) 120/57  Pulse: 74 75  Resp: 11 11  Temp: 36.5 C     Last Pain:  Vitals:   09/24/16 0647  TempSrc: Oral                 Dorion Petillo P Quavon Keisling

## 2016-09-24 NOTE — Op Note (Signed)
PREOPERATIVE DIAGNOSIS:  Bilateral breast cancer     POSTOPERATIVE DIAGNOSIS:  Same     PROCEDURE: Right subclavian port placement, Bard ClearVue  Power Port, MRI safe, 8-French.      SURGEON:  [name], MD      ANESTHESIA:  General   FINDINGS:  Good venous return, easy flush, and tip of the catheter and   SVC 15 cm.      SPECIMEN:  None.      ESTIMATED BLOOD LOSS:  Minimal.      COMPLICATIONS:  None known.      PROCEDURE:  Pt was identified in the holding area and taken to   the operating room, where patient was placed supine on the operating room   table.  General anesthesia was induced.  Patient's arms were tucked and the upper   chest and neck were prepped and draped in sterile fashion.  Time-out was   performed according to the surgical safety check list.  When all was   correct, we continued.   Local anesthetic was administered over this   area at the angle of the clavicle.  The vein was accessed with 2 passes of the needle. There was good venous return and the wire passed easily with no ectopy.   Fluoroscopy was used to confirm that the wire was in the vena cava.      The patient was placed back level and the area for the pocket was anethetized   with local anesthetic.  A 3-cm transverse incision was made with a #15   blade.  Cautery was used to divide the subcutaneous tissues down to the   pectoralis muscle.  An [employer] retractor was used to elevate the skin   while a pocket was created on top of the pectoralis fascia.  The port   was placed into the pocket to confirm that it was of adequate size.  The   catheter was preattached to the port.  The port was then secured to the   pectoralis fascia with four 2-0 Prolene sutures.  These were clamped and   not tied down yet.    The catheter was tunneled through to the wire exit   site.  The catheter was placed along the wire to determine what length it should be to be in the SVC.  The catheter was cut at 15 cm.  The  tunneler sheath and dilator were passed over the wire and the dilator and wire were removed.  The catheter was advanced through the tunneler sheath and the tunneler sheath was pulled away.  Care was taken to keep the catheter in the tunneler sheath as this occurred. This was advanced and the tunneler sheath was removed.  There was good venous   return and easy flush of the catheter.  The Prolene sutures were tied   down to the pectoral fascia.  The skin was reapproximated using 3-0   Vicryl interrupted deep dermal sutures.    Fluoroscopy was used to re-confirm good position of the catheter.  The skin   was then closed using 4-0 Monocryl in a subcuticular fashion.  The port was flushed with concentrated heparin flush as well.  The wounds were then cleaned, dried, and dressed with Dermabond.  The patient was awakened from anesthesia and taken to the PACU in stable condition.  Needle, sponge, and instrument counts were correct.               [name], MD

## 2016-09-24 NOTE — Anesthesia Procedure Notes (Signed)
Procedure Name: LMA Insertion Date/Time: 09/24/2016 7:42 AM Performed by: Valda Favia Pre-anesthesia Checklist: Patient identified, Emergency Drugs available, Suction available, Patient being monitored and Timeout performed Patient Re-evaluated:Patient Re-evaluated prior to inductionOxygen Delivery Method: Circle system utilized Preoxygenation: Pre-oxygenation with 100% oxygen Intubation Type: IV induction LMA: LMA inserted LMA Size: 4.0 Number of attempts: 1 Placement Confirmation: positive ETCO2 and breath sounds checked- equal and bilateral Tube secured with: Tape Dental Injury: Teeth and Oropharynx as per pre-operative assessment

## 2016-09-24 NOTE — Discharge Instructions (Addendum)
[facility] Office Phone Number [phone]   POST OP INSTRUCTIONS  Always review your discharge instruction sheet given to you by the facility where your surgery was performed.  IF YOU HAVE DISABILITY OR FAMILY LEAVE FORMS, YOU MUST BRING THEM TO THE OFFICE FOR PROCESSING.  DO NOT GIVE THEM TO YOUR DOCTOR.  1. A prescription for pain medication may be given to you upon discharge.  Take your pain medication as prescribed, if needed.  If narcotic pain medicine is not needed, then you may take acetaminophen (Tylenol) or ibuprofen (Advil) as needed. 2. Take your usually prescribed medications unless otherwise directed 3. If you need a refill on your pain medication, please contact your pharmacy.  They will contact our office to request authorization.  Prescriptions will not be filled after 5pm or on week-ends. 4. You should eat very light the first 24 hours after surgery, such as soup, crackers, pudding, etc.  Resume your normal diet the day after surgery 5. It is common to experience some constipation if taking pain medication after surgery.  Increasing fluid intake and taking a stool softener will usually help or prevent this problem from occurring.  A mild laxative (Milk of Magnesia or Miralax) should be taken according to package directions if there are no bowel movements after 48 hours. 6. You may shower in 48 hours.  The surgical glue will flake off in 2-3 weeks.   7. ACTIVITIES:  No strenuous activity or heavy lifting for 1 week.   a. You may drive when you no longer are taking prescription pain medication, you can comfortably wear a seatbelt, and you can safely maneuver your car and apply brakes. b. RETURN TO WORK:  __________n/a_______________ You should see your doctor in the office for a follow-up appointment approximately three-four weeks after your surgery.    WHEN TO CALL YOUR DOCTOR: 1. Fever over 101.0 2. Nausea and/or vomiting. 3. Extreme swelling or  bruising. 4. Continued bleeding from incision. 5. Increased pain, redness, or drainage from the incision.  The clinic staff is available to answer your questions during regular business hours.  Please don't hesitate to call and ask to speak to one of the nurses for clinical concerns.  If you have a medical emergency, go to the nearest emergency room or call 911.  A surgeon from [facility] is always on call at the hospital.  For further questions, please visit [email or web address]      

## 2016-09-24 NOTE — Interval H&P Note (Signed)
History and Physical Interval Note:  09/24/2016 7:17 AM  Laurie Collins  has presented today for surgery, with the diagnosis of Bilateral breast cancer  The various methods of treatment have been discussed with the patient and family. After consideration of risks, benefits and other options for treatment, the patient has consented to  Procedure(s): INSERTION PORT-A-CATH (N/A) as a surgical intervention .  The patient's history has been reviewed, patient examined, no change in status, stable for surgery.  I have reviewed the patient's chart and labs.  Questions were answered to the patient's satisfaction.     [name]

## 2016-09-25 ENCOUNTER — Ambulatory Visit (HOSPITAL_BASED_OUTPATIENT_CLINIC_OR_DEPARTMENT_OTHER): Payer: Medicare HMO | Admitting: Adult Health

## 2016-09-25 ENCOUNTER — Encounter: Payer: Self-pay | Admitting: *Deleted

## 2016-09-25 ENCOUNTER — Ambulatory Visit (HOSPITAL_BASED_OUTPATIENT_CLINIC_OR_DEPARTMENT_OTHER): Payer: Medicare HMO

## 2016-09-25 ENCOUNTER — Encounter (HOSPITAL_COMMUNITY): Payer: Self-pay | Admitting: General Surgery

## 2016-09-25 ENCOUNTER — Other Ambulatory Visit (HOSPITAL_BASED_OUTPATIENT_CLINIC_OR_DEPARTMENT_OTHER): Payer: Medicare HMO

## 2016-09-25 VITALS — BP 137/57 | HR 82 | Temp 98.3°F | Resp 18

## 2016-09-25 VITALS — BP 159/53 | HR 92 | Temp 98.4°F | Resp 18 | Ht 63.0 in | Wt 186.7 lb

## 2016-09-25 DIAGNOSIS — C50212 Malignant neoplasm of upper-inner quadrant of left female breast: Secondary | ICD-10-CM

## 2016-09-25 DIAGNOSIS — C50211 Malignant neoplasm of upper-inner quadrant of right female breast: Secondary | ICD-10-CM

## 2016-09-25 DIAGNOSIS — Z171 Estrogen receptor negative status [ER-]: Principal | ICD-10-CM

## 2016-09-25 DIAGNOSIS — D509 Iron deficiency anemia, unspecified: Secondary | ICD-10-CM | POA: Diagnosis not present

## 2016-09-25 DIAGNOSIS — Z5111 Encounter for antineoplastic chemotherapy: Secondary | ICD-10-CM

## 2016-09-25 DIAGNOSIS — Z170 Estrogen receptor positive status [ER+]: Secondary | ICD-10-CM

## 2016-09-25 LAB — COMPREHENSIVE METABOLIC PANEL
ALT: 54 U/L (ref 0–55)
AST: 59 U/L — AB (ref 5–34)
Albumin: 3.6 g/dL (ref 3.5–5.0)
Alkaline Phosphatase: 82 U/L (ref 40–150)
Anion Gap: 11 mEq/L (ref 3–11)
BUN: 33.4 mg/dL — AB (ref 7.0–26.0)
CALCIUM: 10.3 mg/dL (ref 8.4–10.4)
CO2: 26 mEq/L (ref 22–29)
Chloride: 101 mEq/L (ref 98–109)
Creatinine: 1.2 mg/dL — ABNORMAL HIGH (ref 0.6–1.1)
EGFR: 43 mL/min/{1.73_m2} — AB (ref >90)
Glucose: 175 mg/dl — ABNORMAL HIGH (ref 70–140)
POTASSIUM: 4.7 meq/L (ref 3.5–5.1)
SODIUM: 138 meq/L (ref 136–145)
Total Bilirubin: 0.36 mg/dL (ref 0.20–1.20)
Total Protein: 8.9 g/dL — ABNORMAL HIGH (ref 6.4–8.3)

## 2016-09-25 LAB — IRON AND TIBC
%SAT: 6 % — AB (ref 21–57)
Iron: 23 ug/dL — ABNORMAL LOW (ref 41–142)
TIBC: 415 ug/dL (ref 236–444)
UIBC: 392 ug/dL — AB (ref 120–384)

## 2016-09-25 LAB — CBC WITH DIFFERENTIAL/PLATELET
BASO%: 0.2 % (ref 0.0–2.0)
BASOS ABS: 0 10*3/uL (ref 0.0–0.1)
EOS%: 0 % (ref 0.0–7.0)
Eosinophils Absolute: 0 10*3/uL (ref 0.0–0.5)
HEMATOCRIT: 33.7 % — AB (ref 34.8–46.6)
HGB: 10.4 g/dL — ABNORMAL LOW (ref 11.6–15.9)
LYMPH%: 12.8 % — ABNORMAL LOW (ref 14.0–49.7)
MCH: 21.1 pg — AB (ref 25.1–34.0)
MCHC: 30.8 g/dL — AB (ref 31.5–36.0)
MCV: 68.7 fL — ABNORMAL LOW (ref 79.5–101.0)
MONO#: 1.2 10*3/uL — ABNORMAL HIGH (ref 0.1–0.9)
MONO%: 7.2 % (ref 0.0–14.0)
NEUT#: 13.7 10*3/uL — ABNORMAL HIGH (ref 1.5–6.5)
NEUT%: 79.8 % — AB (ref 38.4–76.8)
Platelets: 447 10*3/uL — ABNORMAL HIGH (ref 145–400)
RBC: 4.91 10*6/uL (ref 3.70–5.45)
RDW: 19 % — ABNORMAL HIGH (ref 11.2–14.5)
WBC: 17.2 10*3/uL — ABNORMAL HIGH (ref 3.9–10.3)
lymph#: 2.2 10*3/uL (ref 0.9–3.3)

## 2016-09-25 LAB — FERRITIN: FERRITIN: 81 ng/mL (ref 9–269)

## 2016-09-25 MED ORDER — DEXAMETHASONE SODIUM PHOSPHATE 10 MG/ML IJ SOLN
INTRAMUSCULAR | Status: AC
  Filled 2016-09-25: qty 1

## 2016-09-25 MED ORDER — SODIUM CHLORIDE 0.9 % IV SOLN
80.0000 mg/m2 | Freq: Once | INTRAVENOUS | Status: AC
  Administered 2016-09-25: 156 mg via INTRAVENOUS
  Filled 2016-09-25: qty 26

## 2016-09-25 MED ORDER — HEPARIN SOD (PORK) LOCK FLUSH 100 UNIT/ML IV SOLN
500.0000 [IU] | Freq: Once | INTRAVENOUS | Status: AC | PRN
  Administered 2016-09-25: 500 [IU]
  Filled 2016-09-25: qty 5

## 2016-09-25 MED ORDER — DIPHENHYDRAMINE HCL 50 MG/ML IJ SOLN
INTRAMUSCULAR | Status: AC
  Filled 2016-09-25: qty 1

## 2016-09-25 MED ORDER — FAMOTIDINE IN NACL 20-0.9 MG/50ML-% IV SOLN
INTRAVENOUS | Status: AC
  Filled 2016-09-25: qty 50

## 2016-09-25 MED ORDER — DEXAMETHASONE SODIUM PHOSPHATE 4 MG/ML IJ SOLN
10.0000 mg | Freq: Once | INTRAMUSCULAR | Status: DC
  Administered 2016-09-25: 10 mg via INTRAVENOUS
  Filled 2016-09-25: qty 3

## 2016-09-25 MED ORDER — SODIUM CHLORIDE 0.9% FLUSH
10.0000 mL | INTRAVENOUS | Status: DC | PRN
Start: 2016-09-25 — End: 2016-09-25
  Administered 2016-09-25: 10 mL
  Filled 2016-09-25: qty 10

## 2016-09-25 MED ORDER — SODIUM CHLORIDE 0.9 % IV SOLN
Freq: Once | INTRAVENOUS | Status: AC
  Administered 2016-09-25: 10:00:00 via INTRAVENOUS

## 2016-09-25 MED ORDER — DIPHENHYDRAMINE HCL 50 MG/ML IJ SOLN
25.0000 mg | Freq: Once | INTRAMUSCULAR | Status: AC
  Administered 2016-09-25: 25 mg via INTRAVENOUS

## 2016-09-25 MED ORDER — DEXAMETHASONE SODIUM PHOSPHATE 10 MG/ML IJ SOLN
10.0000 mg | Freq: Once | INTRAMUSCULAR | Status: AC
  Administered 2016-09-25: 10 mg via INTRAVENOUS

## 2016-09-25 MED ORDER — FAMOTIDINE IN NACL 20-0.9 MG/50ML-% IV SOLN
20.0000 mg | Freq: Once | INTRAVENOUS | Status: AC
  Administered 2016-09-25: 20 mg via INTRAVENOUS

## 2016-09-25 NOTE — Assessment & Plan Note (Addendum)
Bilateral breast cancers:  Left breast 11:30 position 6.3 cm with 3 abnormal lymph nodes biopsy of both IDC grade 2 LVI, triple negative, Ki-67 90% T3 N1 stage IIIB; Right breast multiple masses 1.4 cm and 1:00, 5 mm 12:30 axilla negative biopsy ILC grade 2, ER 90%, PR 30%, Ki-67 10%, HER-2 negative ratio 1.74, T1 cN0 stage IA   Recommendation: 1. Genetics/Staging scans 2. Breast MRI 3. Neoadjuvant chemotherapy: Weekly Taxol 12 and if she tolerates this well she will receive Adriamycin Cytoxan every 2 weeks 4 4. Followed by surgery 5. Followed by radiation 6. Followed by antiestrogen therapy because of right-sided breast cancer is ER positive  Chemotherapy: Laurie Collins will proceed with her first round of Taxol today.  I again reviewed this with her along with her son in detail.  We reviewed her medications and how she should take them.  I reviewed scans with them as well.    Microcytic anemia:  Laurie Collins's hemoglobin is 10.4.  When she was evaluated for leukocytosis about a year or so ago her hemoglobin was normal.  She has never had a colonoscopy.  Her CT was normal in regards to her colon.  I will have the nurses draw an iron panel and send her out with stool cards.    Laurie Collins will return in one week for labs, evaluation, and her next chemotherapy.  We will likely need to see her weekly with her treatments for the first few to make sure everything is going smoothly.    Healthy diet and exercise was encouraged.

## 2016-09-25 NOTE — Patient Instructions (Addendum)
[facility] Discharge Instructions for Patients Receiving Chemotherapy  Today you received the following chemotherapy agents: Taxol   To help prevent nausea and vomiting after your treatment, we encourage you to take your nausea medication as directed.    If you develop nausea and vomiting that is not controlled by your nausea medication, call the clinic.   BELOW ARE SYMPTOMS THAT SHOULD BE REPORTED IMMEDIATELY:  *FEVER GREATER THAN 100.5 F  *CHILLS WITH OR WITHOUT FEVER  NAUSEA AND VOMITING THAT IS NOT CONTROLLED WITH YOUR NAUSEA MEDICATION  *UNUSUAL SHORTNESS OF BREATH  *UNUSUAL BRUISING OR BLEEDING  TENDERNESS IN MOUTH AND THROAT WITH OR WITHOUT PRESENCE OF ULCERS  *URINARY PROBLEMS  *BOWEL PROBLEMS  UNUSUAL RASH Items with * indicate a potential emergency and should be followed up as soon as possible.  Feel free to call the clinic you have any questions or concerns. The clinic phone number is [phone].  Please show the CHEMO ALERT CARD at check-in to the Emergency Department and triage nurse.  Paclitaxel injection What is this medicine? PACLITAXEL (PAK li TAX el) is a chemotherapy drug. It targets fast dividing cells, like cancer cells, and causes these cells to die. This medicine is used to treat ovarian cancer, breast cancer, and other cancers. This medicine may be used for other purposes; ask your health care provider or pharmacist if you have questions. COMMON BRAND NAME(S): Onxol, Taxol What should I tell my health care provider before I take this medicine? They need to know if you have any of these conditions: -blood disorders -irregular heartbeat -infection (especially a virus infection such as chickenpox, cold sores, or herpes) -liver disease -previous or ongoing radiation therapy -an unusual or allergic reaction to paclitaxel, alcohol, polyoxyethylated castor oil, other chemotherapy agents, other medicines, foods, dyes, or  preservatives -pregnant or trying to get pregnant -breast-feeding How should I use this medicine? This drug is given as an infusion into a vein. It is administered in a hospital or clinic by a specially trained health care professional. Talk to your pediatrician regarding the use of this medicine in children. Special care may be needed. Overdosage: If you think you have taken too much of this medicine contact a poison control center or emergency room at once. NOTE: This medicine is only for you. Do not share this medicine with others. What if I miss a dose? It is important not to miss your dose. Call your doctor or health care professional if you are unable to keep an appointment. What may interact with this medicine? Do not take this medicine with any of the following medications: -disulfiram -metronidazole This medicine may also interact with the following medications: -cyclosporine -diazepam -ketoconazole -medicines to increase blood counts like filgrastim, pegfilgrastim, sargramostim -other chemotherapy drugs like cisplatin, doxorubicin, epirubicin, etoposide, teniposide, vincristine -quinidine -testosterone -vaccines -verapamil Talk to your doctor or health care professional before taking any of these medicines: -acetaminophen -aspirin -ibuprofen -ketoprofen -naproxen This list may not describe all possible interactions. Give your health care provider a list of all the medicines, herbs, non-prescription drugs, or dietary supplements you use. Also tell them if you smoke, drink alcohol, or use illegal drugs. Some items may interact with your medicine. What should I watch for while using this medicine? Your condition will be monitored carefully while you are receiving this medicine. You will need important blood work done while you are taking this medicine. This medicine can cause serious allergic reactions. To reduce your risk you will need to   take other medicine(s) before  treatment with this medicine. If you experience allergic reactions like skin rash, itching or hives, swelling of the face, lips, or tongue, tell your doctor or health care professional right away. In some cases, you may be given additional medicines to help with side effects. Follow all directions for their use. This drug may make you feel generally unwell. This is not uncommon, as chemotherapy can affect healthy cells as well as cancer cells. Report any side effects. Continue your course of treatment even though you feel ill unless your doctor tells you to stop. Call your doctor or health care professional for advice if you get a fever, chills or sore throat, or other symptoms of a cold or flu. Do not treat yourself. This drug decreases your body's ability to fight infections. Try to avoid being around people who are sick. This medicine may increase your risk to bruise or bleed. Call your doctor or health care professional if you notice any unusual bleeding. Be careful brushing and flossing your teeth or using a toothpick because you may get an infection or bleed more easily. If you have any dental work done, tell your dentist you are receiving this medicine. Avoid taking products that contain aspirin, acetaminophen, ibuprofen, naproxen, or ketoprofen unless instructed by your doctor. These medicines may hide a fever. Do not become pregnant while taking this medicine. Women should inform their doctor if they wish to become pregnant or think they might be pregnant. There is a potential for serious side effects to an unborn child. Talk to your health care professional or pharmacist for more information. Do not breast-feed an infant while taking this medicine. Men are advised not to father a child while receiving this medicine. This product may contain alcohol. Ask your pharmacist or healthcare provider if this medicine contains alcohol. Be sure to tell all healthcare providers you are taking this medicine.  Certain medicines, like metronidazole and disulfiram, can cause an unpleasant reaction when taken with alcohol. The reaction includes flushing, headache, nausea, vomiting, sweating, and increased thirst. The reaction can last from 30 minutes to several hours. What side effects may I notice from receiving this medicine? Side effects that you should report to your doctor or health care professional as soon as possible: -allergic reactions like skin rash, itching or hives, swelling of the face, lips, or tongue -low blood counts - This drug may decrease the number of white blood cells, red blood cells and platelets. You may be at increased risk for infections and bleeding. -signs of infection - fever or chills, cough, sore throat, pain or difficulty passing urine -signs of decreased platelets or bleeding - bruising, pinpoint red spots on the skin, black, tarry stools, nosebleeds -signs of decreased red blood cells - unusually weak or tired, fainting spells, lightheadedness -breathing problems -chest pain -high or low blood pressure -mouth sores -nausea and vomiting -pain, swelling, redness or irritation at the injection site -pain, tingling, numbness in the hands or feet -slow or irregular heartbeat -swelling of the ankle, feet, hands Side effects that usually do not require medical attention (report to your doctor or health care professional if they continue or are bothersome): -bone pain -complete hair loss including hair on your head, underarms, pubic hair, eyebrows, and eyelashes -changes in the color of fingernails -diarrhea -loosening of the fingernails -loss of appetite -muscle or joint pain -red flush to skin -sweating This list may not describe all possible side effects. Call your doctor for medical advice about   side effects. You may report side effects to FDA at 1-800-FDA-1088. Where should I keep my medicine? This drug is given in a hospital or clinic and will not be stored at  home. NOTE: This sheet is a summary. It may not cover all possible information. If you have questions about this medicine, talk to your doctor, pharmacist, or health care provider.  2018 Elsevier/Gold Standard (2015-01-31 19:58:00)    

## 2016-09-25 NOTE — Progress Notes (Signed)
[facility] Follow up:    Laurie Jordan, MD [place] [address] [id number]   DIAGNOSIS: Cancer Staging Malignant neoplasm of upper-inner quadrant of left breast in female, estrogen receptor negative ([place]) Staging form: Breast, AJCC 8th Edition - Clinical stage from 09/11/2016: Stage IIIB (cT2, cN1, cM0, G3, ER: Negative, PR: Negative, HER2: Negative) - Unsigned Staging comments: Staged at breast conference on 5.30.18  Malignant neoplasm of upper-inner quadrant of right breast in female, estrogen receptor positive ([place]) Staging form: Breast, AJCC 8th Edition - Clinical stage from 09/11/2016: Stage IA (cT1c, cN0, cM0, G2, ER: Positive, PR: Positive, HER2: Negative) - Unsigned Staging comments: Staged at breast conference on 5.30.18   SUMMARY OF ONCOLOGIC HISTORY:   Malignant neoplasm of upper-inner quadrant of left breast in female, estrogen receptor negative ([place])   09/02/2016 Initial Diagnosis    Bilateral breast cancers: Left breast 11:30 position 6.3 cm with 3 abnormal lymph nodes biopsy of both IDC grade 2 LVI, triple negative, Ki-67 90% T3 N1 stage IIIB; right breast multiple masses 1.4 cm and 1:00, 5 mm 12:30 axilla negative biopsy ILC grade 2, ER 90%, PR 30%, Ki-67 10%, HER-2 negative ratio 1.74, T1 cN0 stage IA       CURRENT THERAPY: Neoadjuvant chemotherapy with Taxol x 12 possibly forllowed by AC x 4.  INTERVAL HISTORY: Laurie Collins 76 y.o. female returns for evaluation prior to receiving chemotherapy.  She is doing moderately well.  She has been having diarrhea for the past couple of weeks but then started taking imodium and her bowels are now normal.  She is still taking imodium.  She is not taking prednisone right now.  Her WBC is elevated and she says that it is normal for her to have this issue.  She says she has never been told that she is anemic.  Nobody has ever put her on an iron supplement.  She doesn't note any heavy  bleeding from anywhere, however she has also never had colonoscopy.  She does have a port.     Patient Active Problem List   Diagnosis Date Noted  . Malignant neoplasm of upper-inner quadrant of left breast in female, estrogen receptor negative ([place]) 09/05/2016  . Malignant neoplasm of upper-inner quadrant of right breast in female, estrogen receptor positive ([place]) 09/05/2016    is allergic to doxycycline and penicillins.  MEDICAL HISTORY: Past Medical History:  Diagnosis Date  . Anxiety   . Cancer ([place])    breast  . COPD (chronic obstructive pulmonary disease) ([place])   . Depression   . Diabetes mellitus without complication ([place])   . Dyspnea   . Hypertension   . PONV (postoperative nausea and vomiting)    very,very,very sick from anesthesia  . Tuberculosis    1962    SURGICAL HISTORY: Past Surgical History:  Procedure Laterality Date  . BREAST SURGERY     5 breast lumpectomy  . PORTACATH PLACEMENT Right 09/24/2016   Procedure: INSERTION PORT-A-CATH;  Surgeon: [name], MD;  Location: [facility];  Service: General;  Laterality: Right;    SOCIAL HISTORY: Social History   Social History  . Marital status: Widowed    Spouse name: N/A  . Number of children: N/A  . Years of education: N/A   Occupational History  . Not on file.   Social History Main Topics  . Smoking status: Former Smoker    Packs/day: 2.00    Years: 25.00    Types: Cigarettes  Quit date: 2013  . Smokeless tobacco: Never Used  . Alcohol use No  . Drug use: No  . Sexual activity: Not on file   Other Topics Concern  . Not on file   Social History Narrative  . No narrative on file    FAMILY HISTORY: No family history on file.  Review of Systems  Constitutional: Negative for appetite change, chills, diaphoresis, fatigue, fever and unexpected weight change.  HENT:   Negative for hearing loss and lump/mass.   Eyes: Negative for eye problems and icterus.  Respiratory: Negative for chest  tightness, cough and shortness of breath.   Cardiovascular: Negative for chest pain, leg swelling and palpitations.  Gastrointestinal: Negative for abdominal distention and abdominal pain. Diarrhea: occasionally noted in hpi.  Endocrine: Negative for hot flashes.  Genitourinary: Negative for difficulty urinating.   Musculoskeletal: Negative for arthralgias.  Skin: Negative for itching and rash.  Hematological: Negative for adenopathy. Does not bruise/bleed easily.      PHYSICAL EXAMINATION  ECOG PERFORMANCE STATUS: 2 - Symptomatic, <50% confined to bed  Vitals:   09/25/16 0841  BP: (!) 159/53  Pulse: 92  Resp: 18  Temp: 98.4 F (36.9 C)    Physical Exam  Constitutional: She is oriented to person, place, and time and well-developed, well-nourished, and in no distress.  HENT:  Head: Normocephalic and atraumatic.  Mouth/Throat: No oropharyngeal exudate.  Eyes: Pupils are equal, round, and reactive to light. No scleral icterus.  Neck: Neck supple.  Cardiovascular: Normal rate, regular rhythm, normal heart sounds and intact distal pulses.   Pulmonary/Chest: Effort normal and breath sounds normal.  Abdominal: Soft. Bowel sounds are normal.  Musculoskeletal: She exhibits no edema.  Lymphadenopathy:    She has no cervical adenopathy.  Neurological: She is alert and oriented to person, place, and time.  Psychiatric: Mood and affect normal.    LABORATORY DATA:  CBC    Component Value Date/Time   WBC 17.2 (H) 09/25/2016 0826   WBC 12.7 (H) 09/19/2016 1130   RBC 4.91 09/25/2016 0826   RBC 4.86 09/19/2016 1130   HGB 10.4 (L) 09/25/2016 0826   HCT 33.7 (L) 09/25/2016 0826   PLT 447 (H) 09/25/2016 0826   MCV 68.7 (L) 09/25/2016 0826   MCH 21.1 (L) 09/25/2016 0826   MCH 21.4 (L) 09/19/2016 1130   MCHC 30.8 (L) 09/25/2016 0826   MCHC 30.1 09/19/2016 1130   RDW 19.0 (H) 09/25/2016 0826   LYMPHSABS 2.2 09/25/2016 0826   MONOABS 1.2 (H) 09/25/2016 0826   EOSABS 0.0 09/25/2016  0826   BASOSABS 0.0 09/25/2016 0826    CMP     Component Value Date/Time   NA 138 09/25/2016 0826   K 4.7 09/25/2016 0826   CL 100 (L) 09/19/2016 1130   CO2 26 09/25/2016 0826   GLUCOSE 175 (H) 09/25/2016 0826   BUN 33.4 (H) 09/25/2016 0826   CREATININE 1.2 (H) 09/25/2016 0826   CALCIUM 10.3 09/25/2016 0826   PROT 8.9 (H) 09/25/2016 0826   ALBUMIN 3.6 09/25/2016 0826   AST 59 (H) 09/25/2016 0826   ALT 54 09/25/2016 0826   ALKPHOS 82 09/25/2016 0826   BILITOT 0.36 09/25/2016 0826   GFRNONAA 46 (L) 09/19/2016 1130   GFRAA 54 (L) 09/19/2016 1130       ASSESSMENT and PLAN:   Malignant neoplasm of upper-inner quadrant of left breast in female, estrogen receptor negative ([facility]) Bilateral breast cancers:  Left breast 11:30 position 6.3 cm with 3 abnormal lymph  nodes biopsy of both IDC grade 2 LVI, triple negative, Ki-67 90% T3 N1 stage IIIB; Right breast multiple masses 1.4 cm and 1:00, 5 mm 12:30 axilla negative biopsy ILC grade 2, ER 90%, PR 30%, Ki-67 10%, HER-2 negative ratio 1.74, T1 cN0 stage IA   Recommendation: 1. Genetics/Staging scans 2. Breast MRI 3. Neoadjuvant chemotherapy: Weekly Taxol 12 and if she tolerates this well she will receive Adriamycin Cytoxan every 2 weeks 4 4. Followed by surgery 5. Followed by radiation 6. Followed by antiestrogen therapy because of right-sided breast cancer is ER positive  Chemotherapy: Samona will proceed with her first round of Taxol today.  I again reviewed this with her along with her son in detail.  We reviewed her medications and how she should take them.  I reviewed scans with them as well.    Microcytic anemia:  Milda's hemoglobin is 10.4.  When she was evaluated for leukocytosis about a year or so ago her hemoglobin was normal.  She has never had a colonoscopy.  Her CT was normal in regards to her colon.  I will have the nurses draw an iron panel and send her out with stool cards.    Shakisha will return in one week  for labs, evaluation, and her next chemotherapy.  We will likely need to see her weekly with her treatments for the first few to make sure everything is going smoothly.    Healthy diet and exercise was encouraged.       Orders Placed This Encounter  Procedures  . Ferritin    Standing Status:   Future    Number of Occurrences:   1    Standing Expiration Date:   09/25/2017  . Iron and TIBC    Standing Status:   Future    Number of Occurrences:   1    Standing Expiration Date:   09/25/2017    All questions were answered. The patient knows to call the clinic with any problems, questions or concerns. We can certainly see the patient much sooner if necessary.  A total of (30) minutes of face-to-face time was spent with this patient with greater than 50% of that time in counseling and care-coordination.  This note was electronically signed.  Scot Dock, NP 09/25/2016

## 2016-10-01 ENCOUNTER — Telehealth: Payer: Self-pay

## 2016-10-01 NOTE — Telephone Encounter (Signed)
Pt states she did good this week. Denies body aches, she is able to eat and drink. Having a little diarrhea, comes and goes, it is loose, 1-2 times per day. No nausea.

## 2016-10-01 NOTE — Telephone Encounter (Signed)
-----   Message from Veverly Fells, RN sent at 09/25/2016  2:25 PM EDT ----- Regarding: First time Follow-up MD [place]: [phone] Geronimo Running, a patient of MD [name], received Taxol for the first time today. Patient tolerated her treatment well. Patient seems anxious/ confused about potential side effects, particularly nausea. Nausea medications reviewed with patient in depth. Also explained multiple times to patient how to put numbing cream on port-a-cath site. Lots of teaching needed. Patient does not seem to retain information easily.

## 2016-10-02 ENCOUNTER — Encounter: Payer: Self-pay | Admitting: Adult Health

## 2016-10-02 ENCOUNTER — Ambulatory Visit (HOSPITAL_BASED_OUTPATIENT_CLINIC_OR_DEPARTMENT_OTHER): Payer: Medicare HMO | Admitting: Adult Health

## 2016-10-02 ENCOUNTER — Other Ambulatory Visit (HOSPITAL_BASED_OUTPATIENT_CLINIC_OR_DEPARTMENT_OTHER): Payer: Medicare HMO

## 2016-10-02 ENCOUNTER — Ambulatory Visit (HOSPITAL_BASED_OUTPATIENT_CLINIC_OR_DEPARTMENT_OTHER): Payer: Medicare HMO

## 2016-10-02 VITALS — BP 176/64 | HR 87 | Temp 98.5°F | Resp 18 | Ht 63.0 in | Wt 180.6 lb

## 2016-10-02 DIAGNOSIS — Z171 Estrogen receptor negative status [ER-]: Principal | ICD-10-CM

## 2016-10-02 DIAGNOSIS — D509 Iron deficiency anemia, unspecified: Secondary | ICD-10-CM

## 2016-10-02 DIAGNOSIS — C50211 Malignant neoplasm of upper-inner quadrant of right female breast: Secondary | ICD-10-CM

## 2016-10-02 DIAGNOSIS — C773 Secondary and unspecified malignant neoplasm of axilla and upper limb lymph nodes: Secondary | ICD-10-CM | POA: Diagnosis not present

## 2016-10-02 DIAGNOSIS — Z170 Estrogen receptor positive status [ER+]: Secondary | ICD-10-CM

## 2016-10-02 DIAGNOSIS — Z5111 Encounter for antineoplastic chemotherapy: Secondary | ICD-10-CM | POA: Diagnosis not present

## 2016-10-02 DIAGNOSIS — C50212 Malignant neoplasm of upper-inner quadrant of left female breast: Secondary | ICD-10-CM | POA: Diagnosis not present

## 2016-10-02 LAB — CBC WITH DIFFERENTIAL/PLATELET
BASO%: 0.7 % (ref 0.0–2.0)
BASOS ABS: 0.1 10*3/uL (ref 0.0–0.1)
EOS ABS: 0.2 10*3/uL (ref 0.0–0.5)
EOS%: 3.1 % (ref 0.0–7.0)
HEMATOCRIT: 34.7 % — AB (ref 34.8–46.6)
HGB: 11 g/dL — ABNORMAL LOW (ref 11.6–15.9)
LYMPH#: 2.3 10*3/uL (ref 0.9–3.3)
LYMPH%: 31.2 % (ref 14.0–49.7)
MCH: 21.6 pg — AB (ref 25.1–34.0)
MCHC: 31.6 g/dL (ref 31.5–36.0)
MCV: 68.3 fL — AB (ref 79.5–101.0)
MONO#: 0.5 10*3/uL (ref 0.1–0.9)
MONO%: 7.2 % (ref 0.0–14.0)
NEUT#: 4.3 10*3/uL (ref 1.5–6.5)
NEUT%: 57.8 % (ref 38.4–76.8)
Platelets: 356 10*3/uL (ref 145–400)
RBC: 5.08 10*6/uL (ref 3.70–5.45)
RDW: 18.9 % — ABNORMAL HIGH (ref 11.2–14.5)
WBC: 7.4 10*3/uL (ref 3.9–10.3)

## 2016-10-02 LAB — COMPREHENSIVE METABOLIC PANEL
ALT: 114 U/L — AB (ref 0–55)
ANION GAP: 15 meq/L — AB (ref 3–11)
AST: 84 U/L — ABNORMAL HIGH (ref 5–34)
Albumin: 3.8 g/dL (ref 3.5–5.0)
Alkaline Phosphatase: 96 U/L (ref 40–150)
BUN: 22.1 mg/dL (ref 7.0–26.0)
CHLORIDE: 99 meq/L (ref 98–109)
CO2: 27 mEq/L (ref 22–29)
CREATININE: 1 mg/dL (ref 0.6–1.1)
Calcium: 10.5 mg/dL — ABNORMAL HIGH (ref 8.4–10.4)
EGFR: 56 mL/min/{1.73_m2} — ABNORMAL LOW (ref >90)
Glucose: 168 mg/dl — ABNORMAL HIGH (ref 70–140)
Potassium: 4 mEq/L (ref 3.5–5.1)
Sodium: 142 mEq/L (ref 136–145)
Total Bilirubin: 0.83 mg/dL (ref 0.20–1.20)
Total Protein: 8.6 g/dL — ABNORMAL HIGH (ref 6.4–8.3)

## 2016-10-02 LAB — FECAL OCCULT BLOOD, GUAIAC: OCCULT BLOOD: NEGATIVE

## 2016-10-02 MED ORDER — DIPHENHYDRAMINE HCL 50 MG/ML IJ SOLN
25.0000 mg | Freq: Once | INTRAMUSCULAR | Status: AC
  Administered 2016-10-02: 25 mg via INTRAVENOUS

## 2016-10-02 MED ORDER — SODIUM CHLORIDE 0.9 % IV SOLN
Freq: Once | INTRAVENOUS | Status: AC
  Administered 2016-10-02: 10:00:00 via INTRAVENOUS

## 2016-10-02 MED ORDER — SODIUM CHLORIDE 0.9 % IV SOLN: Freq: Once | INTRAVENOUS | Status: DC

## 2016-10-02 MED ORDER — DEXAMETHASONE SODIUM PHOSPHATE 10 MG/ML IJ SOLN
INTRAMUSCULAR | Status: AC
  Filled 2016-10-02: qty 1

## 2016-10-02 MED ORDER — FAMOTIDINE IN NACL 20-0.9 MG/50ML-% IV SOLN
20.0000 mg | Freq: Once | INTRAVENOUS | Status: AC
  Administered 2016-10-02: 20 mg via INTRAVENOUS

## 2016-10-02 MED ORDER — ONDANSETRON HCL 4 MG/2ML IJ SOLN
8.0000 mg | Freq: Once | INTRAMUSCULAR | Status: AC
  Administered 2016-10-02: 8 mg via INTRAVENOUS

## 2016-10-02 MED ORDER — ONDANSETRON HCL 4 MG/2ML IJ SOLN
INTRAMUSCULAR | Status: AC
  Filled 2016-10-02: qty 4

## 2016-10-02 MED ORDER — HEPARIN SOD (PORK) LOCK FLUSH 100 UNIT/ML IV SOLN
500.0000 [IU] | Freq: Once | INTRAVENOUS | Status: AC | PRN
  Administered 2016-10-02: 500 [IU]
  Filled 2016-10-02: qty 5

## 2016-10-02 MED ORDER — SODIUM CHLORIDE 0.9 % IV SOLN
80.0000 mg/m2 | Freq: Once | INTRAVENOUS | Status: AC
  Administered 2016-10-02: 156 mg via INTRAVENOUS
  Filled 2016-10-02: qty 26

## 2016-10-02 MED ORDER — DIPHENHYDRAMINE HCL 50 MG/ML IJ SOLN
INTRAMUSCULAR | Status: AC
  Filled 2016-10-02: qty 1

## 2016-10-02 MED ORDER — DEXAMETHASONE SODIUM PHOSPHATE 4 MG/ML IJ SOLN
10.0000 mg | Freq: Once | INTRAMUSCULAR | Status: DC
  Filled 2016-10-02: qty 3

## 2016-10-02 MED ORDER — DEXAMETHASONE SODIUM PHOSPHATE 10 MG/ML IJ SOLN
10.0000 mg | Freq: Once | INTRAMUSCULAR | Status: AC
  Administered 2016-10-02: 10 mg via INTRAVENOUS

## 2016-10-02 MED ORDER — SODIUM CHLORIDE 0.9% FLUSH
10.0000 mL | INTRAVENOUS | Status: DC | PRN
Start: 2016-10-02 — End: 2016-10-02
  Administered 2016-10-02: 10 mL
  Filled 2016-10-02: qty 10

## 2016-10-02 MED ORDER — FAMOTIDINE IN NACL 20-0.9 MG/50ML-% IV SOLN
INTRAVENOUS | Status: AC
  Filled 2016-10-02: qty 50

## 2016-10-02 NOTE — Assessment & Plan Note (Addendum)
Bilateral breast cancers:  Left breast 11:30 position 6.3 cm with 3 abnormal lymph nodes biopsy of both IDC grade 2 LVI, triple negative, Ki-67 90% T3 N1 stage IIIB; Right breast multiple masses 1.4 cm and 1:00, 5 mm 12:30 axilla negative biopsy ILC grade 2, ER 90%, PR 30%, Ki-67 10%, HER-2 negative ratio 1.74, T1 cN0 stage IA   Recommendation: 1. Genetics/Staging scans 2. Breast MRI 3. Neoadjuvant chemotherapy: Weekly Taxol 12 and if she tolerates this well she will receive Adriamycin Cytoxan every 2 weeks 4 4. Followed by surgery 5. Followed by radiation 6. Followed by antiestrogen therapy because of right-sided breast cancer is ER positive  Chemotherapy: Danyell is doing well today and tolerated her chemotherapy well.  She will proceed today with week two of Paclitaxel.  I reviewed her liver enzymes today with Dr. Jana Hakim, and she is ok to treat with an AST of 84 and ALT of 114.     Microcytic anemia:  Indiya's hemoglobin is 10.4.  When she was evaluated for leukocytosis about a year or so ago her hemoglobin was normal.  She has never had a colonoscopy.  Her CT was normal in regards to her colon.  Iron panel has been drawn and is slightly low, and stool cards are turned in--results pending.  I recommended a daily slow iron supplement if she can tolerate it for right now.    She will return in one week for labs, appt, and her third week of chemotherapy.

## 2016-10-02 NOTE — Patient Instructions (Signed)
[facility] Discharge Instructions for Patients Receiving Chemotherapy  Today you received the following chemotherapy agents:  Taxol  To help prevent nausea and vomiting after your treatment, we encourage you to take your nausea medication as prescribed.   If you develop nausea and vomiting that is not controlled by your nausea medication, call the clinic.   BELOW ARE SYMPTOMS THAT SHOULD BE REPORTED IMMEDIATELY:  *FEVER GREATER THAN 100.5 F  *CHILLS WITH OR WITHOUT FEVER  NAUSEA AND VOMITING THAT IS NOT CONTROLLED WITH YOUR NAUSEA MEDICATION  *UNUSUAL SHORTNESS OF BREATH  *UNUSUAL BRUISING OR BLEEDING  TENDERNESS IN MOUTH AND THROAT WITH OR WITHOUT PRESENCE OF ULCERS  *URINARY PROBLEMS  *BOWEL PROBLEMS  UNUSUAL RASH Items with * indicate a potential emergency and should be followed up as soon as possible.  Feel free to call the clinic you have any questions or concerns. The clinic phone number is [phone].  Please show the CHEMO ALERT CARD at check-in to the Emergency Department and triage nurse.   

## 2016-10-02 NOTE — Progress Notes (Signed)
[facility] Follow up:    Laurie Jordan, MD [place] [address] [id number]   DIAGNOSIS: Cancer Staging Malignant neoplasm of upper-inner quadrant of left breast in female, estrogen receptor negative ([place]) Staging form: Breast, AJCC 8th Edition - Clinical stage from 09/11/2016: Stage IIIB (cT2, cN1, cM0, G3, ER: Negative, PR: Negative, HER2: Negative) - Unsigned Staging comments: Staged at breast conference on 5.30.18  Malignant neoplasm of upper-inner quadrant of right breast in female, estrogen receptor positive ([place]) Staging form: Breast, AJCC 8th Edition - Clinical stage from 09/11/2016: Stage IA (cT1c, cN0, cM0, G2, ER: Positive, PR: Positive, HER2: Negative) - Unsigned Staging comments: Staged at breast conference on 5.30.18   SUMMARY OF ONCOLOGIC HISTORY:   Malignant neoplasm of upper-inner quadrant of left breast in female, estrogen receptor negative ([place])   09/02/2016 Initial Diagnosis    Bilateral breast cancers: Left breast 11:30 position 6.3 cm with 3 abnormal lymph nodes biopsy of both IDC grade 2 LVI, triple negative, Ki-67 90% T3 N1 stage IIIB; right breast multiple masses 1.4 cm and 1:00, 5 mm 12:30 axilla negative biopsy ILC grade 2, ER 90%, PR 30%, Ki-67 10%, HER-2 negative ratio 1.74, T1 cN0 stage IA      09/25/2016 -  Neo-Adjuvant Chemotherapy    Taxol weekly x 12       CURRENT THERAPY: Neoadjuvant weekly Taxol  INTERVAL HISTORY: Laurie Collins 76 y.o. female returns for evaluation prior to receiving weekly Taxol.  She is here today with her friend Laurie Collins.  She is doing well today and tolerated her first cycle of chemotherapy without any difficulty.  She had nausea last night and this morning and she didn't take anything for it.     Patient Active Problem List   Diagnosis Date Noted  . Malignant neoplasm of upper-inner quadrant of left breast in female, estrogen receptor negative ([place]) 09/05/2016  . Malignant  neoplasm of upper-inner quadrant of right breast in female, estrogen receptor positive ([place]) 09/05/2016    is allergic to doxycycline and penicillins.  MEDICAL HISTORY: Past Medical History:  Diagnosis Date  . Anxiety   . Cancer ([place])    breast  . COPD (chronic obstructive pulmonary disease) ([place])   . Depression   . Diabetes mellitus without complication ([employer])   . Dyspnea   . Hypertension   . PONV (postoperative nausea and vomiting)    very,very,very sick from anesthesia  . Tuberculosis    1962    SURGICAL HISTORY: Past Surgical History:  Procedure Laterality Date  . BREAST SURGERY     5 breast lumpectomy  . PORTACATH PLACEMENT Right 09/24/2016   Procedure: INSERTION PORT-A-CATH;  Surgeon: [name], MD;  Location: [facility];  Service: General;  Laterality: Right;    SOCIAL HISTORY: Social History   Social History  . Marital status: Widowed    Spouse name: N/A  . Number of children: N/A  . Years of education: N/A   Occupational History  . Not on file.   Social History Main Topics  . Smoking status: Former Smoker    Packs/day: 2.00    Years: 25.00    Types: Cigarettes    Quit date: 2013  . Smokeless tobacco: Never Used  . Alcohol use No  . Drug use: No  . Sexual activity: Not on file   Other Topics Concern  . Not on file   Social History Narrative  . No narrative on file    FAMILY HISTORY:  No family history on file.  Review of Systems  Constitutional: Negative for appetite change, chills, diaphoresis, fatigue, fever and unexpected weight change.  HENT:   Negative for hearing loss and lump/mass.   Eyes: Negative for eye problems and icterus.  Respiratory: Negative for chest tightness, cough and shortness of breath.   Cardiovascular: Negative for chest pain, leg swelling and palpitations.  Gastrointestinal: Positive for nausea. Negative for abdominal distention, abdominal pain, constipation, diarrhea and vomiting.  Endocrine: Negative for hot  flashes.  Genitourinary: Negative for difficulty urinating.   Musculoskeletal: Negative for arthralgias.  Skin: Negative for itching and rash.  Neurological: Negative for dizziness, extremity weakness, headaches and numbness.  Hematological: Negative for adenopathy. Does not bruise/bleed easily.  Psychiatric/Behavioral: Negative for depression. The patient is not nervous/anxious.       PHYSICAL EXAMINATION  ECOG PERFORMANCE STATUS: 1 - Symptomatic but completely ambulatory  Vitals:   10/02/16 0912  BP: (!) 176/64  Pulse: 87  Resp: 18  Temp: 98.5 F (36.9 C)    Physical Exam  Constitutional: She is oriented to person, place, and time and well-developed, well-nourished, and in no distress.  HENT:  Head: Normocephalic and atraumatic.  Mouth/Throat: Oropharynx is clear and moist. No oropharyngeal exudate.  Eyes: Pupils are equal, round, and reactive to light. No scleral icterus.  Neck: Neck supple.  Cardiovascular: Normal rate, regular rhythm and normal heart sounds.   Pulmonary/Chest: Effort normal and breath sounds normal. No respiratory distress. She has no wheezes. She has no rales.  Left breast mass still large, however softer when compared to last week  Abdominal: Soft. Bowel sounds are normal. She exhibits no distension and no mass. There is no tenderness. There is no rebound and no guarding.  Musculoskeletal: She exhibits no edema.  Lymphadenopathy:    She has no cervical adenopathy.  Neurological: She is alert and oriented to person, place, and time.  Skin: Skin is warm and dry. No rash noted.  Psychiatric: Mood and affect normal.    LABORATORY DATA:  CBC    Component Value Date/Time   WBC 7.4 10/02/2016 0855   WBC 12.7 (H) 09/19/2016 1130   RBC 5.08 10/02/2016 0855   RBC 4.86 09/19/2016 1130   HGB 11.0 (L) 10/02/2016 0855   HCT 34.7 (L) 10/02/2016 0855   PLT 356 10/02/2016 0855   MCV 68.3 (L) 10/02/2016 0855   MCH 21.6 (L) 10/02/2016 0855   MCH 21.4 (L)  09/19/2016 1130   MCHC 31.6 10/02/2016 0855   MCHC 30.1 09/19/2016 1130   RDW 18.9 (H) 10/02/2016 0855   LYMPHSABS 2.3 10/02/2016 0855   MONOABS 0.5 10/02/2016 0855   EOSABS 0.2 10/02/2016 0855   BASOSABS 0.1 10/02/2016 0855    CMP     Component Value Date/Time   NA 142 10/02/2016 0855   K 4.0 10/02/2016 0855   CL 100 (L) 09/19/2016 1130   CO2 27 10/02/2016 0855   GLUCOSE 168 (H) 10/02/2016 0855   BUN 22.1 10/02/2016 0855   CREATININE 1.0 10/02/2016 0855   CALCIUM 10.5 (H) 10/02/2016 0855   PROT 8.6 (H) 10/02/2016 0855   ALBUMIN 3.8 10/02/2016 0855   AST 84 (H) 10/02/2016 0855   ALT 114 (H) 10/02/2016 0855   ALKPHOS 96 10/02/2016 0855   BILITOT 0.83 10/02/2016 0855   GFRNONAA 46 (L) 09/19/2016 1130   GFRAA 54 (L) 09/19/2016 1130        ASSESSMENT and  PLAN:   Malignant neoplasm of upper-inner quadrant of left  breast in female, estrogen receptor negative ([place]) Bilateral breast cancers:  Left breast 11:30 position 6.3 cm with 3 abnormal lymph nodes biopsy of both IDC grade 2 LVI, triple negative, Ki-67 90% T3 N1 stage IIIB; Right breast multiple masses 1.4 cm and 1:00, 5 mm 12:30 axilla negative biopsy ILC grade 2, ER 90%, PR 30%, Ki-67 10%, HER-2 negative ratio 1.74, T1 cN0 stage IA   Recommendation: 1. Genetics/Staging scans 2. Breast MRI 3. Neoadjuvant chemotherapy: Weekly Taxol 12 and if she tolerates this well she will receive Adriamycin Cytoxan every 2 weeks 4 4. Followed by surgery 5. Followed by radiation 6. Followed by antiestrogen therapy because of right-sided breast cancer is ER positive  Chemotherapy: Cerita is doing well today and tolerated her chemotherapy well.  She will proceed today with week two of Paclitaxel.  I reviewed her liver enzymes today with Dr. Jana Hakim, and she is ok to treat with an AST of 84 and ALT of 114.     Microcytic anemia:  Roshawna's hemoglobin is 10.4.  When she was evaluated for leukocytosis about a year or so ago her  hemoglobin was normal.  She has never had a colonoscopy.  Her CT was normal in regards to her colon.  Iron panel has been drawn and is slightly low, and stool cards are turned in--results pending.  I recommended a daily slow iron supplement if she can tolerate it for right now.    She will return in one week for labs, appt, and her third week of chemotherapy.         All questions were answered. The patient knows to call the clinic with any problems, questions or concerns. We can certainly see the patient much sooner if necessary  A total of (30) minutes of face-to-face time was spent with this patient with greater than 50% of that time in counseling and care-coordination.  This note was electronically signed Scot Dock, NP 10/02/2016

## 2016-10-02 NOTE — Progress Notes (Signed)
Ok to treat with ALT 84 and AST 114 per Wilber Bihari, NP and Dr. Jana Hakim.

## 2016-10-03 ENCOUNTER — Encounter: Payer: Self-pay | Admitting: Genetics

## 2016-10-03 ENCOUNTER — Other Ambulatory Visit: Payer: Medicare HMO

## 2016-10-03 ENCOUNTER — Ambulatory Visit (HOSPITAL_BASED_OUTPATIENT_CLINIC_OR_DEPARTMENT_OTHER): Payer: Medicare HMO | Admitting: Genetics

## 2016-10-03 DIAGNOSIS — C50911 Malignant neoplasm of unspecified site of right female breast: Secondary | ICD-10-CM

## 2016-10-03 DIAGNOSIS — C50212 Malignant neoplasm of upper-inner quadrant of left female breast: Secondary | ICD-10-CM | POA: Diagnosis not present

## 2016-10-03 DIAGNOSIS — Z800 Family history of malignant neoplasm of digestive organs: Secondary | ICD-10-CM

## 2016-10-03 DIAGNOSIS — C50211 Malignant neoplasm of upper-inner quadrant of right female breast: Secondary | ICD-10-CM | POA: Diagnosis not present

## 2016-10-03 DIAGNOSIS — C50912 Malignant neoplasm of unspecified site of left female breast: Principal | ICD-10-CM

## 2016-10-03 DIAGNOSIS — Z809 Family history of malignant neoplasm, unspecified: Secondary | ICD-10-CM

## 2016-10-03 DIAGNOSIS — Z315 Encounter for genetic counseling: Secondary | ICD-10-CM

## 2016-10-03 NOTE — Progress Notes (Signed)
REFERRING PROVIDER: Nicholas Lose, MD Laurie Collins, Laurie Collins Laurie Collins  PRIMARY PROVIDER:  [name], MD  PRIMARY REASON FOR VISIT:  1. Bilateral malignant neoplasm of breast in female, unspecified estrogen receptor status, unspecified site of breast ([place])     HISTORY OF PRESENT ILLNESS:   Laurie Collins, a 76 y.o. female, was seen for a [facility] cancer genetics consultation at the request of Laurie Collins due to a personal and family history of cancer.  Laurie Collins presents to clinic today to discuss the possibility of a hereditary predisposition to cancer, genetic testing, and to further clarify her future cancer risks, as well as potential cancer risks for family members. She was accompanied to her appointment by her ex-husband and her friend.  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 She is currently undergoing neoadjuvant chemotherapy.     Malignant neoplasm of upper-inner quadrant of left breast in female, estrogen receptor negative ([place])   09/02/2016 Initial Diagnosis    Bilateral breast cancers: Left breast 11:30 position 6.3 cm with 3 abnormal lymph nodes biopsy of both IDC grade 2 LVI, triple negative, Ki-67 90% T3 N1 stage IIIB; right breast multiple masses 1.4 cm and 1:00, 5 mm 12:30 axilla negative biopsy ILC grade 2, ER 90%, PR 30%, Ki-67 10%, HER-2 negative ratio 1.74, T1 cN0 stage IA      09/25/2016 -  Neo-Adjuvant Chemotherapy    Taxol weekly x 12       Past Medical History:  Diagnosis Date  . Anxiety   . Cancer ([place])    breast  . COPD (chronic obstructive pulmonary disease) ([place])   . Depression   . Diabetes mellitus without complication ([place])   . Dyspnea   . Hypertension   . PONV (postoperative nausea and vomiting)    very,very,very sick from anesthesia  .  Tuberculosis    1962    Past Surgical History:  Procedure Laterality Date  . BREAST SURGERY     5 breast lumpectomy  . PORTACATH PLACEMENT Right 09/24/2016   Procedure: INSERTION PORT-A-CATH;  Surgeon: [name], MD;  Location: [facility];  Service: General;  Laterality: Right;    Social History   Social History  . Marital status: Widowed    Spouse name: N/A  . Number of children: N/A  . Years of education: N/A   Social History Main Topics  . Smoking status: Former Smoker    Packs/day: 2.00    Years: 25.00    Types: Cigarettes    Quit date: 2013  . Smokeless tobacco: Never Used  . Alcohol use No  . Drug use: No  . Sexual activity: Not on file   Other Topics Concern  . Not on file   Social History Narrative  . No narrative on file     FAMILY HISTORY:  We obtained a detailed, 4-generation family history.  Significant diagnoses are listed below: Family History  Problem Relation Age of Onset  . Stomach cancer Mother 74       d.67 from metastatic disease  . Cancer Maternal Aunt        Unspecified type  . Cancer Maternal Uncle        Unspecified type   Laurie Collins has two sons, ages 58 and 14. The older son was diagnosed with throat cancer at age 64 and underwent chemo/radiation  for treatment. He does have a history of smoking. Laurie Collins other son is without cancers and has a healthy 34 year old daughter.  Laurie Collins has a maternal half-sister who died at age 82 without cancer. Her mother was diagnosed with stomach cancer at age 10 and died from metastatic disease within the year. Laurie Collins reports that her mother was one of 42 children and most had cancer. However, Laurie Collins did not know any details regarding the specific types of cancer and ages at diagnosis. To her knowledge, neither maternal grandparent had cancer.  Laurie Collins father died at 22 without cancers. He had one sister and two brothers. Laurie Collins reports that her paternal uncles,  paternal aunt, and both paternal grandparents died from tuberculosis. She does not know their ages at the time of death. There are no known cancers on her father's side of the family.  Laurie Collins is unaware of previous family history of genetic testing for hereditary cancer risks. Patient's maternal and paternal ancestors are of Caucasian descent. There is no reported Ashkenazi Jewish ancestry. There is no known consanguinity.  GENETIC COUNSELING ASSESSMENT: Laurie Collins is a 76 y.o. female with a personal and family history which is somewhat suggestive of a hereditary cancer syndrome and predisposition to cancer. Though the lack of details regarding the types of cancer in her family and ages at diagnosis make it difficult to narrow the hereditary cancer syndromes that could be involved in Laurie Collins's family, I felt that there was enough cancer history to warrant genetic testing. We, therefore, discussed and recommended the following at today's visit.   DISCUSSION: We reviewed the characteristics, features and inheritance patterns of hereditary cancer syndromes. We also discussed genetic testing, including the appropriate family members to test, the process of testing, insurance coverage and turn-around-time for results. We discussed the implications of a negative, positive and/or variant of uncertain significant result. We recommended Laurie Collins pursue genetic testing for the Common Hereditary Cancer Panel offered by [facility]. [facility]'s Common Hereditary Cancers Panel includes analysis of the following 46 genes: APC, ATM, AXIN2, BARD1, BMPR1A, BRCA1, BRCA2, BRIP1, CDH1, CDKN2A, CHEK2, CTNNA1, DICER1, EPCAM, GREM1, HOXB13, KIT, MEN1, MLH1, MSH2, MSH3, MSH6, MUTYH, NBN, NF1, NTHL1, PALB2, PDGFRA, PMS2, POLD1, POLE, PTEN, RAD50, RAD51C, RAD51D, SDHA, SDHB, SDHC, SDHD, SMAD4, SMARCA4, STK11, TP53, TSC1, TSC2, and VHL.   If Laurie Collins's out of pocket cost for testing is over $100, the laboratory  will call and confirm whether she wants to proceed with testing.  If the out of pocket cost of testing is less than $100 she will be billed by the genetic testing laboratory.   PLAN: Despite our recommendation, Laurie Collins did not wish to pursue genetic testing at today's visit. When prompted, she did not give much insight into her decision except to repeat that she is not interested in testing. Laurie Collins ex-husband and friend asked her to consider the impact of her decision on her granddaughter. However, Laurie Collins continued to state that she is not interested in testing at this time. We understand this decision, and remain available to coordinate genetic testing at any time in the future. We recommend Laurie Collins continue to follow the cancer treatment and screening guidelines given by her oncologists and primary healthcare provider.  Lastly, we encouraged Laurie Collins to remain in contact with cancer genetics annually so that we can continuously update the family history and inform her of any changes in cancer genetics and testing that may be  of benefit for this family.   Ms.  Whitworth questions were answered to her satisfaction today. Our contact information was provided should additional questions or concerns arise. Thank you for the referral and allowing [place] to share in the care of your patient.   Mal Misty, MS, [facility] Certified Naval [email or web address] phone: [phone]  The patient was seen for a total of 30 minutes in face-to-face genetic counseling.  This patient was discussed with Drs. Magrinat, Lindi Collins and/or Burr Medico who agrees with the above.    _______________________________________________________________________ For Office Staff:  Number of people involved in session: 3 Was an Intern/ student involved with case: no

## 2016-10-04 ENCOUNTER — Encounter: Payer: Self-pay | Admitting: Genetics

## 2016-10-04 DIAGNOSIS — Z1379 Encounter for other screening for genetic and chromosomal anomalies: Secondary | ICD-10-CM | POA: Insufficient documentation

## 2016-10-08 NOTE — Assessment & Plan Note (Signed)
Bilateral breast cancers:  Left breast 11:30 position 6.3 cm with 3 abnormal lymph nodes biopsy of both IDC grade 2 LVI, triple negative, Ki-67 90% T3 N1 stage IIIB; Right breast multiple masses 1.4 cm and 1:00, 5 mm 12:30 axilla negative biopsy ILC grade 2, ER 90%, PR 30%, Ki-67 10%, HER-2 negative ratio 1.74, T1 cN0 stage IA  Treatment plan: 1. Neoadjuvant chemotherapy: Weekly Taxol 12 and if she tolerates this well she will receive Adriamycin Cytoxan every 2 weeks 4 2. Followed by surgery 3. Followed by radiation 4. Followed by antiestrogen therapy because of right-sided breast cancer is ER positive ------------------------------------------------------------------- Current treatment: Weekly Taxol cycle 3 Chemotherapy toxicities: 1. Elevated LFTs  Return to clinic weekly for chemotherapy and every 2 weeks for follow-up with me

## 2016-10-09 ENCOUNTER — Encounter: Payer: Self-pay | Admitting: *Deleted

## 2016-10-09 ENCOUNTER — Encounter: Payer: Self-pay | Admitting: Hematology and Oncology

## 2016-10-09 ENCOUNTER — Other Ambulatory Visit (HOSPITAL_BASED_OUTPATIENT_CLINIC_OR_DEPARTMENT_OTHER): Payer: Medicare HMO

## 2016-10-09 ENCOUNTER — Ambulatory Visit (HOSPITAL_BASED_OUTPATIENT_CLINIC_OR_DEPARTMENT_OTHER): Payer: Medicare HMO | Admitting: Hematology and Oncology

## 2016-10-09 ENCOUNTER — Other Ambulatory Visit: Payer: Self-pay

## 2016-10-09 ENCOUNTER — Ambulatory Visit (HOSPITAL_BASED_OUTPATIENT_CLINIC_OR_DEPARTMENT_OTHER): Payer: Medicare HMO

## 2016-10-09 VITALS — BP 104/54 | HR 66

## 2016-10-09 VITALS — BP 203/67 | HR 97 | Temp 98.4°F | Resp 18 | Ht 63.0 in | Wt 181.4 lb

## 2016-10-09 DIAGNOSIS — Z170 Estrogen receptor positive status [ER+]: Secondary | ICD-10-CM

## 2016-10-09 DIAGNOSIS — Z171 Estrogen receptor negative status [ER-]: Principal | ICD-10-CM

## 2016-10-09 DIAGNOSIS — C50211 Malignant neoplasm of upper-inner quadrant of right female breast: Secondary | ICD-10-CM | POA: Diagnosis not present

## 2016-10-09 DIAGNOSIS — C50212 Malignant neoplasm of upper-inner quadrant of left female breast: Secondary | ICD-10-CM

## 2016-10-09 DIAGNOSIS — R7989 Other specified abnormal findings of blood chemistry: Secondary | ICD-10-CM | POA: Diagnosis not present

## 2016-10-09 DIAGNOSIS — I10 Essential (primary) hypertension: Secondary | ICD-10-CM | POA: Diagnosis not present

## 2016-10-09 DIAGNOSIS — Z5111 Encounter for antineoplastic chemotherapy: Secondary | ICD-10-CM | POA: Diagnosis not present

## 2016-10-09 LAB — COMPREHENSIVE METABOLIC PANEL
ALBUMIN: 3.9 g/dL (ref 3.5–5.0)
ALK PHOS: 104 U/L (ref 40–150)
ALT: 122 U/L — ABNORMAL HIGH (ref 0–55)
AST: 81 U/L — AB (ref 5–34)
Anion Gap: 14 mEq/L — ABNORMAL HIGH (ref 3–11)
BUN: 18.6 mg/dL (ref 7.0–26.0)
CALCIUM: 10.2 mg/dL (ref 8.4–10.4)
CHLORIDE: 101 meq/L (ref 98–109)
CO2: 27 mEq/L (ref 22–29)
Creatinine: 0.9 mg/dL (ref 0.6–1.1)
EGFR: 62 mL/min/{1.73_m2} — AB (ref >90)
Glucose: 143 mg/dl — ABNORMAL HIGH (ref 70–140)
POTASSIUM: 4.4 meq/L (ref 3.5–5.1)
SODIUM: 143 meq/L (ref 136–145)
Total Bilirubin: 0.7 mg/dL (ref 0.20–1.20)
Total Protein: 8.4 g/dL — ABNORMAL HIGH (ref 6.4–8.3)

## 2016-10-09 LAB — CBC WITH DIFFERENTIAL/PLATELET
BASO%: 1.2 % (ref 0.0–2.0)
BASOS ABS: 0.1 10*3/uL (ref 0.0–0.1)
EOS ABS: 0.2 10*3/uL (ref 0.0–0.5)
EOS%: 5.2 % (ref 0.0–7.0)
HEMATOCRIT: 34.4 % — AB (ref 34.8–46.6)
HEMOGLOBIN: 10.9 g/dL — AB (ref 11.6–15.9)
LYMPH%: 52.1 % — ABNORMAL HIGH (ref 14.0–49.7)
MCH: 22 pg — AB (ref 25.1–34.0)
MCHC: 31.5 g/dL (ref 31.5–36.0)
MCV: 69.8 fL — AB (ref 79.5–101.0)
MONO#: 0.4 10*3/uL (ref 0.1–0.9)
MONO%: 7.7 % (ref 0.0–14.0)
NEUT#: 1.6 10*3/uL (ref 1.5–6.5)
NEUT%: 33.8 % — AB (ref 38.4–76.8)
Platelets: 321 10*3/uL (ref 145–400)
RBC: 4.93 10*6/uL (ref 3.70–5.45)
RDW: 20.4 % — AB (ref 11.2–14.5)
WBC: 4.7 10*3/uL (ref 3.9–10.3)
lymph#: 2.4 10*3/uL (ref 0.9–3.3)

## 2016-10-09 MED ORDER — DIPHENHYDRAMINE HCL 50 MG/ML IJ SOLN
25.0000 mg | Freq: Once | INTRAMUSCULAR | Status: AC
  Administered 2016-10-09: 25 mg via INTRAVENOUS

## 2016-10-09 MED ORDER — ONDANSETRON HCL 4 MG/2ML IJ SOLN
8.0000 mg | Freq: Once | INTRAMUSCULAR | Status: AC
  Administered 2016-10-09: 8 mg via INTRAVENOUS

## 2016-10-09 MED ORDER — DEXAMETHASONE SODIUM PHOSPHATE 10 MG/ML IJ SOLN
10.0000 mg | Freq: Once | INTRAMUSCULAR | Status: AC
  Administered 2016-10-09: 10 mg via INTRAVENOUS

## 2016-10-09 MED ORDER — DIPHENHYDRAMINE HCL 50 MG/ML IJ SOLN
INTRAMUSCULAR | Status: AC
  Filled 2016-10-09: qty 1

## 2016-10-09 MED ORDER — SODIUM CHLORIDE 0.9 % IV SOLN
Freq: Once | INTRAVENOUS | Status: AC
  Administered 2016-10-09: 10:00:00 via INTRAVENOUS

## 2016-10-09 MED ORDER — FAMOTIDINE IN NACL 20-0.9 MG/50ML-% IV SOLN
INTRAVENOUS | Status: AC
  Filled 2016-10-09: qty 50

## 2016-10-09 MED ORDER — DEXAMETHASONE SODIUM PHOSPHATE 10 MG/ML IJ SOLN
INTRAMUSCULAR | Status: AC
  Filled 2016-10-09: qty 1

## 2016-10-09 MED ORDER — CLONIDINE HCL 0.1 MG PO TABS
ORAL_TABLET | ORAL | Status: AC
  Filled 2016-10-09: qty 3

## 2016-10-09 MED ORDER — CLONIDINE HCL 0.1 MG PO TABS
0.3000 mg | ORAL_TABLET | Freq: Once | ORAL | Status: AC
  Administered 2016-10-09: 0.3 mg via ORAL

## 2016-10-09 MED ORDER — SODIUM CHLORIDE 0.9 % IV SOLN
65.0000 mg/m2 | Freq: Once | INTRAVENOUS | Status: AC
  Administered 2016-10-09: 126 mg via INTRAVENOUS
  Filled 2016-10-09: qty 21

## 2016-10-09 MED ORDER — SODIUM CHLORIDE 0.9% FLUSH
10.0000 mL | INTRAVENOUS | Status: DC | PRN
  Administered 2016-10-09: 10 mL
  Filled 2016-10-09: qty 10

## 2016-10-09 MED ORDER — HEPARIN SOD (PORK) LOCK FLUSH 100 UNIT/ML IV SOLN
500.0000 [IU] | Freq: Once | INTRAVENOUS | Status: AC | PRN
  Administered 2016-10-09: 500 [IU]
  Filled 2016-10-09: qty 5

## 2016-10-09 MED ORDER — FAMOTIDINE IN NACL 20-0.9 MG/50ML-% IV SOLN
20.0000 mg | Freq: Once | INTRAVENOUS | Status: AC
  Administered 2016-10-09: 20 mg via INTRAVENOUS

## 2016-10-09 MED ORDER — ONDANSETRON HCL 4 MG/2ML IJ SOLN
INTRAMUSCULAR | Status: AC
  Filled 2016-10-09: qty 4

## 2016-10-09 NOTE — Progress Notes (Signed)
Pt. BP 200's sys/ 60's. Per Dr.Gudena, give pt clonidine 0.3 for BP. Notified infusion nurse and placed order under sign and held orders. Will need to check BP prior to administration and after med given.

## 2016-10-09 NOTE — Progress Notes (Signed)
Okay to treat with elevated LFTs per Dr. Gudena ?

## 2016-10-09 NOTE — Progress Notes (Signed)
Patient Care Team: [name], MD as PCP - General (Family Medicine) [name], MD as Consulting Physician (General Surgery) Nicholas Lose, MD as Consulting Physician (Hematology and Oncology)  DIAGNOSIS:  Encounter Diagnoses  Name Primary?  . Malignant neoplasm of upper-inner quadrant of left breast in female, estrogen receptor negative ([place]) Yes  . Malignant neoplasm of upper-inner quadrant of right breast in female, estrogen receptor positive ([place])     SUMMARY OF ONCOLOGIC HISTORY:   Malignant neoplasm of upper-inner quadrant of left breast in female, estrogen receptor negative ([place])   09/02/2016 Initial Diagnosis    Bilateral breast cancers: Left breast 11:30 position 6.3 cm with 3 abnormal lymph nodes biopsy of both IDC grade 2 LVI, triple negative, Ki-67 90% T3 N1 stage IIIB; right breast multiple masses 1.4 cm and 1:00, 5 mm 12:30 axilla negative biopsy ILC grade 2, ER 90%, PR 30%, Ki-67 10%, HER-2 negative ratio 1.74, T1 cN0 stage IA      09/25/2016 -  Neo-Adjuvant Chemotherapy    Taxol weekly x 12      10/03/2016 Genetic Testing    Underwent genetic counseling 10/03/2016. Declined genetic testing.       Malignant neoplasm of upper-inner quadrant of right breast in female, estrogen receptor positive ([place])   09/05/2016 Initial Diagnosis    Malignant neoplasm of upper-inner quadrant of right breast in female, estrogen receptor positive ([place])     10/03/2016 Genetic Testing    Underwent genetic counseling 10/03/2016. Declined genetic testing.       CHIEF COMPLIANT: Bilateral breast cancers on neoadjuvant chemotherapy, today is cycle 3 of Taxol  INTERVAL HISTORY: Laurie Collins is a 76 year old with above-mentioned history of bilateral breast cancers who is undergoing neoadjuvant chemotherapy and today is cycle 3 of Taxol. Overall she tolerated first 2 cycles of treatment fairly well. She did not have any nausea vomiting. She did not have neuropathy. After the  first cycle of chemotherapy, there was an increase in the AST and ALT levels. Patient has been cutting down on her carbohydrate intake. I suspect the elevation of AST and ALT is related to fatty liver along with the infusion of Taxol. Patient still has excellent appetite. Her blood pressure is markedly elevated. Patient feels that the left breast has become softer.  REVIEW OF SYSTEMS:   Constitutional: Denies fevers, chills or abnormal weight loss Eyes: Denies blurriness of vision Ears, nose, mouth, throat, and face: Denies mucositis or sore throat Respiratory: Denies cough, dyspnea or wheezes Cardiovascular: Denies palpitation, chest discomfort Gastrointestinal:  Denies nausea, heartburn or change in bowel habits Skin: Denies abnormal skin rashes Lymphatics: Denies new lymphadenopathy or easy bruising Neurological:Denies numbness, tingling or new weaknesses Behavioral/Psych: Mood is stable, no new changes  Extremities: No lower extremity edema  All other systems were reviewed with the patient and are negative.  I have reviewed the past medical history, past surgical history, social history and family history with the patient and they are unchanged from previous note.  ALLERGIES:  is allergic to doxycycline and penicillins.  MEDICATIONS:  Current Outpatient Prescriptions  Medication Sig Dispense Refill  . amLODipine (NORVASC) 10 MG tablet Take 10 mg by mouth daily.    Marland Kitchen aspirin 81 MG chewable tablet Chew 81 mg by mouth daily.    . Cholecalciferol (VITAMIN D) 2000 units tablet Take 2,000 Units by mouth daily.    . clonazePAM (KLONOPIN) 0.5 MG tablet Take 0.5 mg by mouth at bedtime as needed for sleep or anxiety.    Marland Kitchen  glimepiride (AMARYL) 1 MG tablet Take 1 mg by mouth daily with breakfast.    . hydrochlorothiazide (MICROZIDE) 12.5 MG capsule Take 12.5 mg by mouth daily.    Marland Kitchen lidocaine-prilocaine (EMLA) cream Apply to affected area once 30 g 3  . lisinopril (PRINIVIL,ZESTRIL) 20 MG  tablet Take 20 mg by mouth daily.    Marland Kitchen loperamide (IMODIUM) 2 MG capsule Take 2 mg by mouth every 4 (four) hours as needed for diarrhea or loose stools.     Marland Kitchen LORazepam (ATIVAN) 1 MG tablet Take 1 tablet (1 mg total) by mouth at bedtime. 30 tablet 0  . metFORMIN (GLUCOPHAGE) 1000 MG tablet Take 1,000 mg by mouth 2 (two) times daily with a meal.    . metoprolol succinate (TOPROL-XL) 100 MG 24 hr tablet Take 100 mg by mouth daily.    . ondansetron (ZOFRAN) 8 MG tablet Take 1 tablet (8 mg total) by mouth 2 (two) times daily as needed (Nausea or vomiting). 30 tablet 1  . oxyCODONE (OXY IR/ROXICODONE) 5 MG immediate release tablet Take 1-2 tablets (5-10 mg total) by mouth every 6 (six) hours as needed for moderate pain, severe pain or breakthrough pain. 15 tablet 0  . prochlorperazine (COMPAZINE) 10 MG tablet Take 1 tablet (10 mg total) by mouth every 6 (six) hours as needed (Nausea or vomiting). 30 tablet 1  . simvastatin (ZOCOR) 10 MG tablet Take 10 mg by mouth at bedtime.     No current facility-administered medications for this visit.   According  PHYSICAL EXAMINATION: ECOG PERFORMANCE STATUS: 1 - Symptomatic but completely ambulatory  Vitals:   10/09/16 0829  BP: (!) 203/67  Pulse: 97  Resp: 18  Temp: 98.4 F (36.9 C)   Filed Weights   10/09/16 0829  Weight: 181 lb 6.4 oz (82.3 kg)    GENERAL:alert, no distress and comfortable SKIN: skin color, texture, turgor are normal, no rashes or significant lesions EYES: normal, Conjunctiva are pink and non-injected, sclera clear OROPHARYNX:no exudate, no erythema and lips, buccal mucosa, and tongue normal  NECK: supple, thyroid normal size, non-tender, without nodularity LYMPH:  no palpable lymphadenopathy in the cervical, axillary or inguinal LUNGS: clear to auscultation and percussion with normal breathing effort HEART: regular rate & rhythm and no murmurs and no lower extremity edema ABDOMEN:abdomen soft, non-tender and normal bowel  sounds MUSCULOSKELETAL:no cyanosis of digits and no clubbing  NEURO: alert & oriented x 3 with fluent speech, no focal motor/sensory deficits EXTREMITIES: No lower extremity edema   LABORATORY DATA:  I have reviewed the data as listed   Chemistry      Component Value Date/Time   NA 143 10/09/2016 0807   K 4.4 10/09/2016 0807   CL 100 (L) 09/19/2016 1130   CO2 27 10/09/2016 0807   BUN 18.6 10/09/2016 0807   CREATININE 0.9 10/09/2016 0807      Component Value Date/Time   CALCIUM 10.2 10/09/2016 0807   ALKPHOS 104 10/09/2016 0807   AST 81 (H) 10/09/2016 0807   ALT 122 (H) 10/09/2016 0807   BILITOT 0.70 10/09/2016 0807       Lab Results  Component Value Date   WBC 4.7 10/09/2016   HGB 10.9 (L) 10/09/2016   HCT 34.4 (L) 10/09/2016   MCV 69.8 (L) 10/09/2016   PLT 321 10/09/2016   NEUTROABS 1.6 10/09/2016    ASSESSMENT & PLAN:  Malignant neoplasm of upper-inner quadrant of left breast in female, estrogen receptor negative ([place]) Bilateral breast cancers:  Left breast  11:30 position 6.3 cm with 3 abnormal lymph nodes biopsy of both IDC grade 2 LVI, triple negative, Ki-67 90% T3 N1 stage IIIB; Right breast multiple masses 1.4 cm and 1:00, 5 mm 12:30 axilla negative biopsy ILC grade 2, ER 90%, PR 30%, Ki-67 10%, HER-2 negative ratio 1.74, T1 cN0 stage IA  Treatment plan: 1. Neoadjuvant chemotherapy: Weekly Taxol 12 and if she tolerates this well she will receive Adriamycin Cytoxan every 2 weeks 4 2. Followed by surgery 3. Followed by radiation 4. Followed by antiestrogen therapy because of right-sided breast cancer is ER positive ------------------------------------------------------------------- Current treatment: Weekly Taxol cycle 3 Chemotherapy toxicities: 1. Elevated LFTs  Carbohydrate addiction: I discussed with her about curbing her sugar and carbohydrate intake. Severe hypertension: Patient did not take her blood pressure medication today. We will give 0.3 mg  of clonidine today. I instructed her to take her blood pressure medication before coming in to infusions. Return to clinic weekly for chemotherapy and every 2 weeks for follow-up with me    I spent 25 minutes talking to the patient of which more than half was spent in counseling and coordination of care.  No orders of the defined types were placed in this encounter.  The patient has a good understanding of the overall plan. she agrees with it. she will call with any problems that may develop before the next visit here.   Rulon Eisenmenger, MD 10/09/16

## 2016-10-09 NOTE — Patient Instructions (Signed)
[facility] Discharge Instructions for Patients Receiving Chemotherapy  Today you received the following chemotherapy agents:  Taxol  To help prevent nausea and vomiting after your treatment, we encourage you to take your nausea medication as prescribed.   If you develop nausea and vomiting that is not controlled by your nausea medication, call the clinic.   BELOW ARE SYMPTOMS THAT SHOULD BE REPORTED IMMEDIATELY:  *FEVER GREATER THAN 100.5 F  *CHILLS WITH OR WITHOUT FEVER  NAUSEA AND VOMITING THAT IS NOT CONTROLLED WITH YOUR NAUSEA MEDICATION  *UNUSUAL SHORTNESS OF BREATH  *UNUSUAL BRUISING OR BLEEDING  TENDERNESS IN MOUTH AND THROAT WITH OR WITHOUT PRESENCE OF ULCERS  *URINARY PROBLEMS  *BOWEL PROBLEMS  UNUSUAL RASH Items with * indicate a potential emergency and should be followed up as soon as possible.  Feel free to call the clinic you have any questions or concerns. The clinic phone number is [phone].  Please show the CHEMO ALERT CARD at check-in to the Emergency Department and triage nurse.   

## 2016-10-09 NOTE — Progress Notes (Signed)
Gudena aware of new BP and still ordered 0.3mg  of Clonidine.  1215 - Dr. [name] aware of new BP at completion of treatment. Patient is drowsy but does have someone driving her home. Ok to discharge now per MD [name].

## 2016-10-15 ENCOUNTER — Other Ambulatory Visit (HOSPITAL_BASED_OUTPATIENT_CLINIC_OR_DEPARTMENT_OTHER): Payer: Medicare HMO

## 2016-10-15 ENCOUNTER — Ambulatory Visit (HOSPITAL_BASED_OUTPATIENT_CLINIC_OR_DEPARTMENT_OTHER): Payer: Medicare HMO

## 2016-10-15 VITALS — BP 149/57 | HR 73 | Temp 98.2°F | Resp 17

## 2016-10-15 DIAGNOSIS — C50212 Malignant neoplasm of upper-inner quadrant of left female breast: Secondary | ICD-10-CM | POA: Diagnosis not present

## 2016-10-15 DIAGNOSIS — Z5111 Encounter for antineoplastic chemotherapy: Secondary | ICD-10-CM | POA: Diagnosis not present

## 2016-10-15 DIAGNOSIS — C50211 Malignant neoplasm of upper-inner quadrant of right female breast: Secondary | ICD-10-CM

## 2016-10-15 DIAGNOSIS — Z171 Estrogen receptor negative status [ER-]: Principal | ICD-10-CM

## 2016-10-15 LAB — COMPREHENSIVE METABOLIC PANEL
ALBUMIN: 3.7 g/dL (ref 3.5–5.0)
ALK PHOS: 97 U/L (ref 40–150)
ALT: 104 U/L — ABNORMAL HIGH (ref 0–55)
AST: 73 U/L — AB (ref 5–34)
Anion Gap: 9 mEq/L (ref 3–11)
BUN: 22.6 mg/dL (ref 7.0–26.0)
CHLORIDE: 103 meq/L (ref 98–109)
CO2: 29 meq/L (ref 22–29)
Calcium: 10.3 mg/dL (ref 8.4–10.4)
Creatinine: 0.9 mg/dL (ref 0.6–1.1)
EGFR: 59 mL/min/{1.73_m2} — AB (ref >90)
GLUCOSE: 126 mg/dL (ref 70–140)
POTASSIUM: 4.7 meq/L (ref 3.5–5.1)
SODIUM: 140 meq/L (ref 136–145)
Total Bilirubin: 0.51 mg/dL (ref 0.20–1.20)
Total Protein: 8.1 g/dL (ref 6.4–8.3)

## 2016-10-15 LAB — CBC WITH DIFFERENTIAL/PLATELET
BASO%: 0.6 % (ref 0.0–2.0)
BASOS ABS: 0 10*3/uL (ref 0.0–0.1)
EOS ABS: 0.3 10*3/uL (ref 0.0–0.5)
EOS%: 4.1 % (ref 0.0–7.0)
HCT: 35.6 % (ref 34.8–46.6)
HEMOGLOBIN: 10.7 g/dL — AB (ref 11.6–15.9)
LYMPH#: 2.7 10*3/uL (ref 0.9–3.3)
LYMPH%: 38.9 % (ref 14.0–49.7)
MCH: 22.1 pg — AB (ref 25.1–34.0)
MCHC: 30.1 g/dL — ABNORMAL LOW (ref 31.5–36.0)
MCV: 73.6 fL — AB (ref 79.5–101.0)
MONO#: 0.5 10*3/uL (ref 0.1–0.9)
MONO%: 7.3 % (ref 0.0–14.0)
NEUT#: 3.4 10*3/uL (ref 1.5–6.5)
NEUT%: 49.1 % (ref 38.4–76.8)
Platelets: 456 10*3/uL — ABNORMAL HIGH (ref 145–400)
RBC: 4.84 10*6/uL (ref 3.70–5.45)
RDW: 20.9 % — ABNORMAL HIGH (ref 11.2–14.5)
WBC: 6.8 10*3/uL (ref 3.9–10.3)

## 2016-10-15 MED ORDER — FAMOTIDINE IN NACL 20-0.9 MG/50ML-% IV SOLN
INTRAVENOUS | Status: AC
  Filled 2016-10-15: qty 50

## 2016-10-15 MED ORDER — ONDANSETRON HCL 4 MG/2ML IJ SOLN
INTRAMUSCULAR | Status: AC
  Filled 2016-10-15: qty 2

## 2016-10-15 MED ORDER — SODIUM CHLORIDE 0.9 % IV SOLN
Freq: Once | INTRAVENOUS | Status: AC
  Administered 2016-10-15: 13:00:00 via INTRAVENOUS

## 2016-10-15 MED ORDER — DEXAMETHASONE SODIUM PHOSPHATE 10 MG/ML IJ SOLN
10.0000 mg | Freq: Once | INTRAMUSCULAR | Status: AC
  Administered 2016-10-15: 10 mg via INTRAVENOUS

## 2016-10-15 MED ORDER — PACLITAXEL CHEMO INJECTION 300 MG/50ML
65.0000 mg/m2 | Freq: Once | INTRAVENOUS | Status: AC
  Administered 2016-10-15: 126 mg via INTRAVENOUS
  Filled 2016-10-15: qty 21

## 2016-10-15 MED ORDER — DEXAMETHASONE SODIUM PHOSPHATE 10 MG/ML IJ SOLN
INTRAMUSCULAR | Status: AC
  Filled 2016-10-15: qty 1

## 2016-10-15 MED ORDER — SODIUM CHLORIDE 0.9% FLUSH
10.0000 mL | INTRAVENOUS | Status: DC | PRN
  Administered 2016-10-15: 10 mL
  Filled 2016-10-15: qty 10

## 2016-10-15 MED ORDER — ONDANSETRON HCL 4 MG/2ML IJ SOLN
8.0000 mg | Freq: Once | INTRAMUSCULAR | Status: AC
  Administered 2016-10-15: 8 mg via INTRAVENOUS

## 2016-10-15 MED ORDER — DIPHENHYDRAMINE HCL 50 MG/ML IJ SOLN
INTRAMUSCULAR | Status: AC
  Filled 2016-10-15: qty 1

## 2016-10-15 MED ORDER — HEPARIN SOD (PORK) LOCK FLUSH 100 UNIT/ML IV SOLN
500.0000 [IU] | Freq: Once | INTRAVENOUS | Status: AC | PRN
  Administered 2016-10-15: 500 [IU]
  Filled 2016-10-15: qty 5

## 2016-10-15 MED ORDER — FAMOTIDINE IN NACL 20-0.9 MG/50ML-% IV SOLN
20.0000 mg | Freq: Once | INTRAVENOUS | Status: AC
  Administered 2016-10-15: 20 mg via INTRAVENOUS

## 2016-10-15 MED ORDER — DIPHENHYDRAMINE HCL 50 MG/ML IJ SOLN
25.0000 mg | Freq: Once | INTRAMUSCULAR | Status: AC
Start: 2016-10-15 — End: 2016-10-15
  Administered 2016-10-15: 25 mg via INTRAVENOUS

## 2016-10-15 NOTE — Patient Instructions (Signed)
[facility] Discharge Instructions for Patients Receiving Chemotherapy  Today you received the following chemotherapy agents:  Taxol  To help prevent nausea and vomiting after your treatment, we encourage you to take your nausea medication as prescribed.   If you develop nausea and vomiting that is not controlled by your nausea medication, call the clinic.   BELOW ARE SYMPTOMS THAT SHOULD BE REPORTED IMMEDIATELY:  *FEVER GREATER THAN 100.5 F  *CHILLS WITH OR WITHOUT FEVER  NAUSEA AND VOMITING THAT IS NOT CONTROLLED WITH YOUR NAUSEA MEDICATION  *UNUSUAL SHORTNESS OF BREATH  *UNUSUAL BRUISING OR BLEEDING  TENDERNESS IN MOUTH AND THROAT WITH OR WITHOUT PRESENCE OF ULCERS  *URINARY PROBLEMS  *BOWEL PROBLEMS  UNUSUAL RASH Items with * indicate a potential emergency and should be followed up as soon as possible.  Feel free to call the clinic you have any questions or concerns. The clinic phone number is [phone].  Please show the CHEMO ALERT CARD at check-in to the Emergency Department and triage nurse.   

## 2016-10-15 NOTE — Progress Notes (Signed)
OK to give TAxol with ALT of 104

## 2016-10-23 ENCOUNTER — Encounter: Payer: Self-pay | Admitting: Hematology and Oncology

## 2016-10-23 ENCOUNTER — Ambulatory Visit (HOSPITAL_BASED_OUTPATIENT_CLINIC_OR_DEPARTMENT_OTHER): Payer: Medicare HMO | Admitting: Hematology and Oncology

## 2016-10-23 ENCOUNTER — Other Ambulatory Visit (HOSPITAL_BASED_OUTPATIENT_CLINIC_OR_DEPARTMENT_OTHER): Payer: Medicare HMO

## 2016-10-23 ENCOUNTER — Ambulatory Visit (HOSPITAL_BASED_OUTPATIENT_CLINIC_OR_DEPARTMENT_OTHER): Payer: Medicare HMO

## 2016-10-23 DIAGNOSIS — R638 Other symptoms and signs concerning food and fluid intake: Secondary | ICD-10-CM | POA: Diagnosis not present

## 2016-10-23 DIAGNOSIS — Z171 Estrogen receptor negative status [ER-]: Principal | ICD-10-CM

## 2016-10-23 DIAGNOSIS — R7989 Other specified abnormal findings of blood chemistry: Secondary | ICD-10-CM

## 2016-10-23 DIAGNOSIS — Z5111 Encounter for antineoplastic chemotherapy: Secondary | ICD-10-CM | POA: Diagnosis not present

## 2016-10-23 DIAGNOSIS — C50212 Malignant neoplasm of upper-inner quadrant of left female breast: Secondary | ICD-10-CM

## 2016-10-23 DIAGNOSIS — R112 Nausea with vomiting, unspecified: Secondary | ICD-10-CM

## 2016-10-23 DIAGNOSIS — Z170 Estrogen receptor positive status [ER+]: Secondary | ICD-10-CM

## 2016-10-23 DIAGNOSIS — Z5112 Encounter for antineoplastic immunotherapy: Secondary | ICD-10-CM

## 2016-10-23 DIAGNOSIS — L659 Nonscarring hair loss, unspecified: Secondary | ICD-10-CM

## 2016-10-23 DIAGNOSIS — C50211 Malignant neoplasm of upper-inner quadrant of right female breast: Secondary | ICD-10-CM

## 2016-10-23 DIAGNOSIS — I10 Essential (primary) hypertension: Secondary | ICD-10-CM

## 2016-10-23 LAB — CBC WITH DIFFERENTIAL/PLATELET
BASO%: 0.4 % (ref 0.0–2.0)
Basophils Absolute: 0 10*3/uL (ref 0.0–0.1)
EOS%: 3 % (ref 0.0–7.0)
Eosinophils Absolute: 0.2 10*3/uL (ref 0.0–0.5)
HCT: 34.6 % — ABNORMAL LOW (ref 34.8–46.6)
HEMOGLOBIN: 10.4 g/dL — AB (ref 11.6–15.9)
LYMPH%: 36.2 % (ref 14.0–49.7)
MCH: 22.3 pg — ABNORMAL LOW (ref 25.1–34.0)
MCHC: 30.1 g/dL — ABNORMAL LOW (ref 31.5–36.0)
MCV: 74.2 fL — ABNORMAL LOW (ref 79.5–101.0)
MONO#: 0.6 10*3/uL (ref 0.1–0.9)
MONO%: 8.9 % (ref 0.0–14.0)
NEUT%: 51.5 % (ref 38.4–76.8)
NEUTROS ABS: 3.6 10*3/uL (ref 1.5–6.5)
Platelets: 401 10*3/uL — ABNORMAL HIGH (ref 145–400)
RBC: 4.66 10*6/uL (ref 3.70–5.45)
RDW: 22.1 % — AB (ref 11.2–14.5)
WBC: 7 10*3/uL (ref 3.9–10.3)
lymph#: 2.6 10*3/uL (ref 0.9–3.3)

## 2016-10-23 LAB — COMPREHENSIVE METABOLIC PANEL
ALBUMIN: 3.7 g/dL (ref 3.5–5.0)
ALK PHOS: 90 U/L (ref 40–150)
ALT: 100 U/L — ABNORMAL HIGH (ref 0–55)
AST: 57 U/L — AB (ref 5–34)
Anion Gap: 12 mEq/L — ABNORMAL HIGH (ref 3–11)
BILIRUBIN TOTAL: 0.31 mg/dL (ref 0.20–1.20)
BUN: 20 mg/dL (ref 7.0–26.0)
CO2: 27 meq/L (ref 22–29)
Calcium: 9.9 mg/dL (ref 8.4–10.4)
Chloride: 103 mEq/L (ref 98–109)
Creatinine: 1.1 mg/dL (ref 0.6–1.1)
EGFR: 47 mL/min/{1.73_m2} — AB (ref >90)
GLUCOSE: 218 mg/dL — AB (ref 70–140)
POTASSIUM: 3.7 meq/L (ref 3.5–5.1)
SODIUM: 142 meq/L (ref 136–145)
TOTAL PROTEIN: 7.8 g/dL (ref 6.4–8.3)

## 2016-10-23 MED ORDER — SODIUM CHLORIDE 0.9 % IV SOLN
65.0000 mg/m2 | Freq: Once | INTRAVENOUS | Status: AC
  Administered 2016-10-23: 126 mg via INTRAVENOUS
  Filled 2016-10-23: qty 21

## 2016-10-23 MED ORDER — ONDANSETRON HCL 4 MG/2ML IJ SOLN
INTRAMUSCULAR | Status: AC
  Filled 2016-10-23: qty 4

## 2016-10-23 MED ORDER — FAMOTIDINE IN NACL 20-0.9 MG/50ML-% IV SOLN
INTRAVENOUS | Status: AC
  Filled 2016-10-23: qty 50

## 2016-10-23 MED ORDER — SODIUM CHLORIDE 0.9% FLUSH
10.0000 mL | INTRAVENOUS | Status: DC | PRN
  Administered 2016-10-23: 10 mL
  Filled 2016-10-23: qty 10

## 2016-10-23 MED ORDER — SODIUM CHLORIDE 0.9 % IV SOLN
Freq: Once | INTRAVENOUS | Status: AC
  Administered 2016-10-23: 09:00:00 via INTRAVENOUS

## 2016-10-23 MED ORDER — DIPHENHYDRAMINE HCL 50 MG/ML IJ SOLN
INTRAMUSCULAR | Status: AC
  Filled 2016-10-23: qty 1

## 2016-10-23 MED ORDER — HEPARIN SOD (PORK) LOCK FLUSH 100 UNIT/ML IV SOLN
500.0000 [IU] | Freq: Once | INTRAVENOUS | Status: AC | PRN
Start: 2016-10-23 — End: 2016-10-23
  Administered 2016-10-23: 500 [IU]
  Filled 2016-10-23: qty 5

## 2016-10-23 MED ORDER — DEXAMETHASONE SODIUM PHOSPHATE 10 MG/ML IJ SOLN
INTRAMUSCULAR | Status: AC
  Filled 2016-10-23: qty 1

## 2016-10-23 MED ORDER — DIPHENHYDRAMINE HCL 50 MG/ML IJ SOLN
25.0000 mg | Freq: Once | INTRAMUSCULAR | Status: AC
  Administered 2016-10-23: 25 mg via INTRAVENOUS

## 2016-10-23 MED ORDER — FAMOTIDINE IN NACL 20-0.9 MG/50ML-% IV SOLN
20.0000 mg | Freq: Once | INTRAVENOUS | Status: AC
  Administered 2016-10-23: 20 mg via INTRAVENOUS

## 2016-10-23 MED ORDER — ONDANSETRON HCL 4 MG/2ML IJ SOLN
8.0000 mg | Freq: Once | INTRAMUSCULAR | Status: AC
  Administered 2016-10-23: 8 mg via INTRAVENOUS

## 2016-10-23 MED ORDER — DEXAMETHASONE SODIUM PHOSPHATE 10 MG/ML IJ SOLN
10.0000 mg | Freq: Once | INTRAMUSCULAR | Status: AC
  Administered 2016-10-23: 10 mg via INTRAVENOUS

## 2016-10-23 NOTE — Progress Notes (Signed)
Patient Care Team: [name], MD as PCP - General (Family Medicine) [name], MD as Consulting Physician (General Surgery) Nicholas Lose, MD as Consulting Physician (Hematology and Oncology)  DIAGNOSIS:  Encounter Diagnosis  Name Primary?  . Malignant neoplasm of upper-inner quadrant of left breast in female, estrogen receptor negative ([place])     SUMMARY OF ONCOLOGIC HISTORY:   Malignant neoplasm of upper-inner quadrant of left breast in female, estrogen receptor negative ([place])   09/02/2016 Initial Diagnosis    Bilateral breast cancers: Left breast 11:30 position 6.3 cm with 3 abnormal lymph nodes biopsy of both IDC grade 2 LVI, triple negative, Ki-67 90% T3 N1 stage IIIB; right breast multiple masses 1.4 cm and 1:00, 5 mm 12:30 axilla negative biopsy ILC grade 2, ER 90%, PR 30%, Ki-67 10%, HER-2 negative ratio 1.74, T1 cN0 stage IA      09/25/2016 -  Neo-Adjuvant Chemotherapy    Taxol weekly x 12      10/03/2016 Genetic Testing    Underwent genetic counseling 10/03/2016. Declined genetic testing.       Malignant neoplasm of upper-inner quadrant of right breast in female, estrogen receptor positive ([place])   09/05/2016 Initial Diagnosis    Malignant neoplasm of upper-inner quadrant of right breast in female, estrogen receptor positive ([place])     10/03/2016 Genetic Testing    Underwent genetic counseling 10/03/2016. Declined genetic testing.       CHIEF COMPLIANT: Cycle 5 Taxol  INTERVAL HISTORY: Laurie Collins is a 76 year old with above-mentioned history of bilateral breast cancers currently on neoadjuvant chemotherapy. Today is cycle 5 of Taxol. Overall she is tolerating Taxol fairly well. We reduced the dosage of Taxol because of elevation of LFTs. She denies neuropathy. She does have fatigue.She complains of intermittent nausea and vomiting 1  REVIEW OF SYSTEMS:   Constitutional: Denies fevers, chills or abnormal weight loss; complains of fatigue Eyes: Denies  blurriness of vision Ears, nose, mouth, throat, and face: Denies mucositis or sore throat Respiratory: Denies cough, dyspnea or wheezes Cardiovascular: Denies palpitation, chest discomfort Gastrointestinal:  Nausea and vomiting Skin: Denies abnormal skin rashes Lymphatics: Denies new lymphadenopathy or easy bruising Neurological:Denies numbness, tingling or new weaknesses Behavioral/Psych: Mood is stable, no new changes  Extremities: No lower extremity edema  All other systems were reviewed with the patient and are negative.  I have reviewed the past medical history, past surgical history, social history and family history with the patient and they are unchanged from previous note.  ALLERGIES:  is allergic to doxycycline and penicillins.  MEDICATIONS:  Current Outpatient Prescriptions  Medication Sig Dispense Refill  . amLODipine (NORVASC) 10 MG tablet Take 10 mg by mouth daily.    Marland Kitchen aspirin 81 MG chewable tablet Chew 81 mg by mouth daily.    . Cholecalciferol (VITAMIN D) 2000 units tablet Take 2,000 Units by mouth daily.    . clonazePAM (KLONOPIN) 0.5 MG tablet Take 0.5 mg by mouth at bedtime as needed for sleep or anxiety.    Marland Kitchen glimepiride (AMARYL) 1 MG tablet Take 1 mg by mouth daily with breakfast.    . hydrochlorothiazide (MICROZIDE) 12.5 MG capsule Take 12.5 mg by mouth daily.    Marland Kitchen lidocaine-prilocaine (EMLA) cream Apply to affected area once 30 g 3  . lisinopril (PRINIVIL,ZESTRIL) 20 MG tablet Take 20 mg by mouth daily.    Marland Kitchen loperamide (IMODIUM) 2 MG capsule Take 2 mg by mouth every 4 (four) hours as needed for diarrhea or loose stools.     Marland Kitchen  LORazepam (ATIVAN) 1 MG tablet Take 1 tablet (1 mg total) by mouth at bedtime. 30 tablet 0  . metFORMIN (GLUCOPHAGE) 1000 MG tablet Take 1,000 mg by mouth 2 (two) times daily with a meal.    . metoprolol succinate (TOPROL-XL) 100 MG 24 hr tablet Take 100 mg by mouth daily.    . ondansetron (ZOFRAN) 8 MG tablet Take 1 tablet (8 mg total) by  mouth 2 (two) times daily as needed (Nausea or vomiting). 30 tablet 1  . oxyCODONE (OXY IR/ROXICODONE) 5 MG immediate release tablet Take 1-2 tablets (5-10 mg total) by mouth every 6 (six) hours as needed for moderate pain, severe pain or breakthrough pain. 15 tablet 0  . prochlorperazine (COMPAZINE) 10 MG tablet Take 1 tablet (10 mg total) by mouth every 6 (six) hours as needed (Nausea or vomiting). 30 tablet 1  . simvastatin (ZOCOR) 10 MG tablet Take 10 mg by mouth at bedtime.     No current facility-administered medications for this visit.    Facility-Administered Medications Ordered in Other Visits  Medication Dose Route Frequency Provider Last Rate Last Dose  . heparin lock flush 100 unit/mL  500 Units Intracatheter Once PRN Nicholas Lose, MD      . PACLitaxel (TAXOL) 126 mg in sodium chloride 0.9 % 250 mL chemo infusion (</= 73m/m2)  65 mg/m2 (Treatment Plan Recorded) Intravenous Once GNicholas Lose MD      . sodium chloride flush (NS) 0.9 % injection 10 mL  10 mL Intracatheter PRN GNicholas Lose MD        PHYSICAL EXAMINATION: ECOG PERFORMANCE STATUS: 1 - Symptomatic but completely ambulatory  Vitals:   10/23/16 0822  BP: (!) 151/59  Pulse: 80  Resp: 17  Temp: 98.2 F (36.8 C)   Filed Weights   10/23/16 0822  Weight: 180 lb 4.8 oz (81.8 kg)    GENERAL:alert, no distress and comfortable SKIN: skin color, texture, turgor are normal, no rashes or significant lesions EYES: normal, Conjunctiva are pink and non-injected, sclera clear OROPHARYNX:no exudate, no erythema and lips, buccal mucosa, and tongue normal  NECK: supple, thyroid normal size, non-tender, without nodularity LYMPH:  no palpable lymphadenopathy in the cervical, axillary or inguinal LUNGS: clear to auscultation and percussion with normal breathing effort HEART: regular rate & rhythm and no murmurs and no lower extremity edema ABDOMEN:abdomen soft, non-tender and normal bowel sounds MUSCULOSKELETAL:no cyanosis  of digits and no clubbing  NEURO: alert & oriented x 3 with fluent speech, no focal motor/sensory deficits EXTREMITIES: No lower extremity edema  LABORATORY DATA:  I have reviewed the data as listed   Chemistry      Component Value Date/Time   NA 142 10/23/2016 0807   K 3.7 10/23/2016 0807   CL 100 (L) 09/19/2016 1130   CO2 27 10/23/2016 0807   BUN 20.0 10/23/2016 0807   CREATININE 1.1 10/23/2016 0807      Component Value Date/Time   CALCIUM 9.9 10/23/2016 0807   ALKPHOS 90 10/23/2016 0807   AST 57 (H) 10/23/2016 0807   ALT 100 (H) 10/23/2016 0807   BILITOT 0.31 10/23/2016 0807       Lab Results  Component Value Date   WBC 7.0 10/23/2016   HGB 10.4 (L) 10/23/2016   HCT 34.6 (L) 10/23/2016   MCV 74.2 (L) 10/23/2016   PLT 401 (H) 10/23/2016   NEUTROABS 3.6 10/23/2016    ASSESSMENT & PLAN:  Malignant neoplasm of upper-inner quadrant of left breast in female, estrogen receptor negative (  [employer]) Bilateral breast cancers:  Left breast11:30 position 6.3 cm with 3 abnormal lymph nodes biopsy of both IDC grade 2 LVI, triple negative, Ki-67 90% T3 N1 stage IIIB; Right breastmultiple masses 1.4 cm and 1:00, 5 mm 12:30 axilla negative biopsy ILC grade 2, ER 90%, PR 30%, Ki-67 10%, HER-2 negative ratio 1.74, T1 cN0 stage IA  Treatment plan: 1. Neoadjuvant chemotherapy: Weekly Taxol 12 and if she tolerates this well she will receive Adriamycin Cytoxan every 2 weeks 4 2. Followed by surgery 3. Followed by radiation 4. Followed by antiestrogen therapy because of right-sided breast cancer is ER positive ------------------------------------------------------------------- Current treatment: Weekly Taxol cycle 5 Chemotherapy toxicities: 1. Elevated LFTs: Dosed reduced with cycle 3 2. Alopecia 3. Nausea and vomiting: Patient does not want to take any medications  Carbohydrate addiction: I discussed with her about curbing her sugar and carbohydrate intake. Severe hypertension:  Today's blood pressure is much better than before.  Return to clinic weekly for chemotherapy and every 2 weeks for follow-up with me    I spent 25 minutes talking to the patient of which more than half was spent in counseling and coordination of care.  No orders of the defined types were placed in this encounter.  The patient has a good understanding of the overall plan. she agrees with it. she will call with any problems that may develop before the next visit here.   Rulon Eisenmenger, MD 10/23/16

## 2016-10-23 NOTE — Progress Notes (Signed)
Per May, RN, per Dr. [name] okay to treat today with ALT of 100.

## 2016-10-23 NOTE — Assessment & Plan Note (Signed)
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 Neoadjuvant chemotherapy: Weekly Taxol 12 and if she tolerates this well she will receive Adriamycin Cytoxan every 2 weeks 4 2. Followed by surgery 3. Followed by radiation 4. Followed by antiestrogen therapy because of right-sided breast cancer is ER positive ------------------------------------------------------------------- Current treatment: Weekly Taxol cycle 5 Chemotherapy toxicities: 1. Elevated LFTs: Dosed reduced with cycle 3  Carbohydrate addiction: I discussed with her about curbing her sugar and carbohydrate intake.  Severe hypertension:   Return to clinic weekly for chemotherapy and every 2 weeks for follow-up with me

## 2016-10-23 NOTE — Patient Instructions (Signed)
[facility] Discharge Instructions for Patients Receiving Chemotherapy  Today you received the following chemotherapy agents Taxol   To help prevent nausea and vomiting after your treatment, we encourage you to take your nausea medication as directed.   If you develop nausea and vomiting that is not controlled by your nausea medication, call the clinic.   BELOW ARE SYMPTOMS THAT SHOULD BE REPORTED IMMEDIATELY:  *FEVER GREATER THAN 100.5 F  *CHILLS WITH OR WITHOUT FEVER  NAUSEA AND VOMITING THAT IS NOT CONTROLLED WITH YOUR NAUSEA MEDICATION  *UNUSUAL SHORTNESS OF BREATH  *UNUSUAL BRUISING OR BLEEDING  TENDERNESS IN MOUTH AND THROAT WITH OR WITHOUT PRESENCE OF ULCERS  *URINARY PROBLEMS  *BOWEL PROBLEMS  UNUSUAL RASH Items with * indicate a potential emergency and should be followed up as soon as possible.  Feel free to call the clinic you have any questions or concerns. The clinic phone number is [phone].  Please show the CHEMO ALERT CARD at check-in to the Emergency Department and triage nurse.   

## 2016-10-30 ENCOUNTER — Ambulatory Visit (HOSPITAL_BASED_OUTPATIENT_CLINIC_OR_DEPARTMENT_OTHER): Payer: Medicare HMO

## 2016-10-30 VITALS — BP 174/67 | HR 92 | Temp 98.3°F | Resp 20

## 2016-10-30 DIAGNOSIS — Z171 Estrogen receptor negative status [ER-]: Principal | ICD-10-CM

## 2016-10-30 DIAGNOSIS — Z5111 Encounter for antineoplastic chemotherapy: Secondary | ICD-10-CM

## 2016-10-30 DIAGNOSIS — C50211 Malignant neoplasm of upper-inner quadrant of right female breast: Secondary | ICD-10-CM

## 2016-10-30 DIAGNOSIS — C50212 Malignant neoplasm of upper-inner quadrant of left female breast: Secondary | ICD-10-CM | POA: Diagnosis not present

## 2016-10-30 LAB — CBC WITH DIFFERENTIAL/PLATELET
BASO%: 0.5 % (ref 0.0–2.0)
Basophils Absolute: 0 10*3/uL (ref 0.0–0.1)
EOS%: 3.2 % (ref 0.0–7.0)
Eosinophils Absolute: 0.3 10*3/uL (ref 0.0–0.5)
HCT: 33.5 % — ABNORMAL LOW (ref 34.8–46.6)
HGB: 10.1 g/dL — ABNORMAL LOW (ref 11.6–15.9)
LYMPH#: 3.1 10*3/uL (ref 0.9–3.3)
LYMPH%: 38.1 % (ref 14.0–49.7)
MCH: 22.7 pg — ABNORMAL LOW (ref 25.1–34.0)
MCHC: 30.1 g/dL — AB (ref 31.5–36.0)
MCV: 75.5 fL — ABNORMAL LOW (ref 79.5–101.0)
MONO#: 0.8 10*3/uL (ref 0.1–0.9)
MONO%: 9.5 % (ref 0.0–14.0)
NEUT%: 48.7 % (ref 38.4–76.8)
NEUTROS ABS: 4 10*3/uL (ref 1.5–6.5)
Platelets: 336 10*3/uL (ref 145–400)
RBC: 4.44 10*6/uL (ref 3.70–5.45)
RDW: 23.3 % — ABNORMAL HIGH (ref 11.2–14.5)
WBC: 8.1 10*3/uL (ref 3.9–10.3)

## 2016-10-30 LAB — COMPREHENSIVE METABOLIC PANEL
ALT: 76 U/L — AB (ref 0–55)
AST: 45 U/L — AB (ref 5–34)
Albumin: 3.6 g/dL (ref 3.5–5.0)
Alkaline Phosphatase: 85 U/L (ref 40–150)
Anion Gap: 11 mEq/L (ref 3–11)
BUN: 19.3 mg/dL (ref 7.0–26.0)
CHLORIDE: 104 meq/L (ref 98–109)
CO2: 27 meq/L (ref 22–29)
CREATININE: 1 mg/dL (ref 0.6–1.1)
Calcium: 10.1 mg/dL (ref 8.4–10.4)
EGFR: 53 mL/min/{1.73_m2} — ABNORMAL LOW (ref >90)
GLUCOSE: 148 mg/dL — AB (ref 70–140)
Potassium: 4.1 mEq/L (ref 3.5–5.1)
Sodium: 142 mEq/L (ref 136–145)
Total Bilirubin: 0.26 mg/dL (ref 0.20–1.20)
Total Protein: 7.6 g/dL (ref 6.4–8.3)

## 2016-10-30 MED ORDER — DIPHENHYDRAMINE HCL 50 MG/ML IJ SOLN
25.0000 mg | Freq: Once | INTRAMUSCULAR | Status: AC
  Administered 2016-10-30: 25 mg via INTRAVENOUS

## 2016-10-30 MED ORDER — PALONOSETRON HCL INJECTION 0.25 MG/5ML
INTRAVENOUS | Status: AC
  Filled 2016-10-30: qty 5

## 2016-10-30 MED ORDER — SODIUM CHLORIDE 0.9 % IV SOLN
65.0000 mg/m2 | Freq: Once | INTRAVENOUS | Status: AC
  Administered 2016-10-30: 126 mg via INTRAVENOUS
  Filled 2016-10-30: qty 21

## 2016-10-30 MED ORDER — ONDANSETRON HCL 4 MG/2ML IJ SOLN
INTRAMUSCULAR | Status: AC
  Filled 2016-10-30: qty 4

## 2016-10-30 MED ORDER — SODIUM CHLORIDE 0.9 % IV SOLN
Freq: Once | INTRAVENOUS | Status: AC
  Administered 2016-10-30: 09:00:00 via INTRAVENOUS

## 2016-10-30 MED ORDER — FAMOTIDINE IN NACL 20-0.9 MG/50ML-% IV SOLN
INTRAVENOUS | Status: AC
  Filled 2016-10-30: qty 50

## 2016-10-30 MED ORDER — HEPARIN SOD (PORK) LOCK FLUSH 100 UNIT/ML IV SOLN
500.0000 [IU] | Freq: Once | INTRAVENOUS | Status: AC | PRN
  Administered 2016-10-30: 500 [IU]
  Filled 2016-10-30: qty 5

## 2016-10-30 MED ORDER — DIPHENHYDRAMINE HCL 50 MG/ML IJ SOLN
INTRAMUSCULAR | Status: AC
  Filled 2016-10-30: qty 1

## 2016-10-30 MED ORDER — SODIUM CHLORIDE 0.9% FLUSH
10.0000 mL | INTRAVENOUS | Status: DC | PRN
  Administered 2016-10-30: 10 mL
  Filled 2016-10-30: qty 10

## 2016-10-30 MED ORDER — FAMOTIDINE IN NACL 20-0.9 MG/50ML-% IV SOLN
20.0000 mg | Freq: Once | INTRAVENOUS | Status: AC
  Administered 2016-10-30: 20 mg via INTRAVENOUS

## 2016-10-30 MED ORDER — ONDANSETRON HCL 4 MG/2ML IJ SOLN
8.0000 mg | Freq: Once | INTRAMUSCULAR | Status: AC
  Administered 2016-10-30: 8 mg via INTRAVENOUS

## 2016-10-30 MED ORDER — DEXAMETHASONE SODIUM PHOSPHATE 10 MG/ML IJ SOLN
10.0000 mg | Freq: Once | INTRAMUSCULAR | Status: AC
  Administered 2016-10-30: 10 mg via INTRAVENOUS

## 2016-10-30 MED ORDER — DEXAMETHASONE SODIUM PHOSPHATE 10 MG/ML IJ SOLN
INTRAMUSCULAR | Status: AC
  Filled 2016-10-30: qty 1

## 2016-10-30 NOTE — Patient Instructions (Signed)
[facility] Discharge Instructions for Patients Receiving Chemotherapy  Today you received the following chemotherapy agents Taxol   To help prevent nausea and vomiting after your treatment, we encourage you to take your nausea medication as directed.   If you develop nausea and vomiting that is not controlled by your nausea medication, call the clinic.   BELOW ARE SYMPTOMS THAT SHOULD BE REPORTED IMMEDIATELY:  *FEVER GREATER THAN 100.5 F  *CHILLS WITH OR WITHOUT FEVER  NAUSEA AND VOMITING THAT IS NOT CONTROLLED WITH YOUR NAUSEA MEDICATION  *UNUSUAL SHORTNESS OF BREATH  *UNUSUAL BRUISING OR BLEEDING  TENDERNESS IN MOUTH AND THROAT WITH OR WITHOUT PRESENCE OF ULCERS  *URINARY PROBLEMS  *BOWEL PROBLEMS  UNUSUAL RASH Items with * indicate a potential emergency and should be followed up as soon as possible.  Feel free to call the clinic you have any questions or concerns. The clinic phone number is [phone].  Please show the CHEMO ALERT CARD at check-in to the Emergency Department and triage nurse.   

## 2016-11-05 NOTE — Assessment & Plan Note (Signed)
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 Neoadjuvant chemotherapy: Weekly Taxol 12 and if she tolerates this well she will receive Adriamycin Cytoxan every 2 weeks 4 2. Followed by surgery 3. Followed by radiation 4. Followed by antiestrogen therapy because of right-sided breast cancer is ER positive ------------------------------------------------------------------- Current treatment: Weekly Taxol cycle 7 Chemotherapy toxicities: 1.Elevated LFTs: Dosed reduced with cycle 3 2. Alopecia 3. Nausea and vomiting: Patient does not want to take any medications  Carbohydrate addiction: I discussed with her about curbing her sugar and carbohydrate intake. Severe hypertension: Today's blood pressure is much better than before.  Return to clinic weekly for chemotherapy and every 2 weeks for follow-up with me

## 2016-11-06 ENCOUNTER — Other Ambulatory Visit (HOSPITAL_BASED_OUTPATIENT_CLINIC_OR_DEPARTMENT_OTHER): Payer: Medicare HMO

## 2016-11-06 ENCOUNTER — Encounter: Payer: Self-pay | Admitting: Hematology and Oncology

## 2016-11-06 ENCOUNTER — Ambulatory Visit (HOSPITAL_BASED_OUTPATIENT_CLINIC_OR_DEPARTMENT_OTHER): Payer: Medicare HMO

## 2016-11-06 ENCOUNTER — Ambulatory Visit (HOSPITAL_BASED_OUTPATIENT_CLINIC_OR_DEPARTMENT_OTHER): Payer: Medicare HMO | Admitting: Hematology and Oncology

## 2016-11-06 DIAGNOSIS — Z171 Estrogen receptor negative status [ER-]: Secondary | ICD-10-CM

## 2016-11-06 DIAGNOSIS — L658 Other specified nonscarring hair loss: Secondary | ICD-10-CM

## 2016-11-06 DIAGNOSIS — C50211 Malignant neoplasm of upper-inner quadrant of right female breast: Secondary | ICD-10-CM | POA: Diagnosis not present

## 2016-11-06 DIAGNOSIS — Z5111 Encounter for antineoplastic chemotherapy: Secondary | ICD-10-CM | POA: Diagnosis not present

## 2016-11-06 DIAGNOSIS — C50212 Malignant neoplasm of upper-inner quadrant of left female breast: Secondary | ICD-10-CM

## 2016-11-06 DIAGNOSIS — I10 Essential (primary) hypertension: Secondary | ICD-10-CM | POA: Diagnosis not present

## 2016-11-06 DIAGNOSIS — R110 Nausea: Secondary | ICD-10-CM | POA: Diagnosis not present

## 2016-11-06 DIAGNOSIS — R7989 Other specified abnormal findings of blood chemistry: Secondary | ICD-10-CM

## 2016-11-06 DIAGNOSIS — R638 Other symptoms and signs concerning food and fluid intake: Secondary | ICD-10-CM

## 2016-11-06 LAB — COMPREHENSIVE METABOLIC PANEL
ALT: 102 U/L — ABNORMAL HIGH (ref 0–55)
AST: 73 U/L — AB (ref 5–34)
Albumin: 3.7 g/dL (ref 3.5–5.0)
Alkaline Phosphatase: 85 U/L (ref 40–150)
Anion Gap: 11 mEq/L (ref 3–11)
BUN: 21.1 mg/dL (ref 7.0–26.0)
CALCIUM: 10.2 mg/dL (ref 8.4–10.4)
CHLORIDE: 102 meq/L (ref 98–109)
CO2: 27 mEq/L (ref 22–29)
Creatinine: 0.9 mg/dL (ref 0.6–1.1)
EGFR: 60 mL/min/{1.73_m2} — AB (ref >90)
Glucose: 220 mg/dl — ABNORMAL HIGH (ref 70–140)
POTASSIUM: 4.3 meq/L (ref 3.5–5.1)
Sodium: 139 mEq/L (ref 136–145)
Total Bilirubin: 0.33 mg/dL (ref 0.20–1.20)
Total Protein: 7.8 g/dL (ref 6.4–8.3)

## 2016-11-06 LAB — CBC WITH DIFFERENTIAL/PLATELET
BASO%: 0.7 % (ref 0.0–2.0)
BASOS ABS: 0.1 10*3/uL (ref 0.0–0.1)
EOS ABS: 0.2 10*3/uL (ref 0.0–0.5)
EOS%: 2.6 % (ref 0.0–7.0)
HEMATOCRIT: 33.8 % — AB (ref 34.8–46.6)
HGB: 10.4 g/dL — ABNORMAL LOW (ref 11.6–15.9)
LYMPH#: 3.1 10*3/uL (ref 0.9–3.3)
LYMPH%: 34.9 % (ref 14.0–49.7)
MCH: 23.1 pg — AB (ref 25.1–34.0)
MCHC: 30.8 g/dL — ABNORMAL LOW (ref 31.5–36.0)
MCV: 74.9 fL — AB (ref 79.5–101.0)
MONO#: 0.8 10*3/uL (ref 0.1–0.9)
MONO%: 8.9 % (ref 0.0–14.0)
NEUT#: 4.6 10*3/uL (ref 1.5–6.5)
NEUT%: 52.9 % (ref 38.4–76.8)
PLATELETS: 403 10*3/uL — AB (ref 145–400)
RBC: 4.51 10*6/uL (ref 3.70–5.45)
RDW: 24.4 % — AB (ref 11.2–14.5)
WBC: 8.8 10*3/uL (ref 3.9–10.3)

## 2016-11-06 LAB — TECHNOLOGIST REVIEW

## 2016-11-06 MED ORDER — HEPARIN SOD (PORK) LOCK FLUSH 100 UNIT/ML IV SOLN
500.0000 [IU] | Freq: Once | INTRAVENOUS | Status: AC | PRN
  Administered 2016-11-06: 500 [IU]
  Filled 2016-11-06: qty 5

## 2016-11-06 MED ORDER — FAMOTIDINE IN NACL 20-0.9 MG/50ML-% IV SOLN
INTRAVENOUS | Status: AC
  Filled 2016-11-06: qty 50

## 2016-11-06 MED ORDER — DEXAMETHASONE SODIUM PHOSPHATE 10 MG/ML IJ SOLN
10.0000 mg | Freq: Once | INTRAMUSCULAR | Status: AC
  Administered 2016-11-06: 10 mg via INTRAVENOUS

## 2016-11-06 MED ORDER — SODIUM CHLORIDE 0.9 % IV SOLN
65.0000 mg/m2 | Freq: Once | INTRAVENOUS | Status: AC
  Administered 2016-11-06: 126 mg via INTRAVENOUS
  Filled 2016-11-06: qty 21

## 2016-11-06 MED ORDER — DIPHENHYDRAMINE HCL 50 MG/ML IJ SOLN
25.0000 mg | Freq: Once | INTRAMUSCULAR | Status: AC
  Administered 2016-11-06: 25 mg via INTRAVENOUS

## 2016-11-06 MED ORDER — DEXAMETHASONE SODIUM PHOSPHATE 10 MG/ML IJ SOLN
INTRAMUSCULAR | Status: AC
  Filled 2016-11-06: qty 1

## 2016-11-06 MED ORDER — ONDANSETRON HCL 4 MG/2ML IJ SOLN
INTRAMUSCULAR | Status: AC
  Filled 2016-11-06: qty 4

## 2016-11-06 MED ORDER — SODIUM CHLORIDE 0.9% FLUSH
10.0000 mL | INTRAVENOUS | Status: DC | PRN
  Administered 2016-11-06: 10 mL
  Filled 2016-11-06: qty 10

## 2016-11-06 MED ORDER — DIPHENHYDRAMINE HCL 50 MG/ML IJ SOLN
INTRAMUSCULAR | Status: AC
  Filled 2016-11-06: qty 1

## 2016-11-06 MED ORDER — SODIUM CHLORIDE 0.9 % IV SOLN
Freq: Once | INTRAVENOUS | Status: AC
  Administered 2016-11-06: 10:00:00 via INTRAVENOUS

## 2016-11-06 MED ORDER — ONDANSETRON HCL 4 MG/2ML IJ SOLN
8.0000 mg | Freq: Once | INTRAMUSCULAR | Status: AC
  Administered 2016-11-06: 8 mg via INTRAVENOUS

## 2016-11-06 MED ORDER — FAMOTIDINE IN NACL 20-0.9 MG/50ML-% IV SOLN
20.0000 mg | Freq: Once | INTRAVENOUS | Status: AC
  Administered 2016-11-06: 20 mg via INTRAVENOUS

## 2016-11-06 NOTE — Progress Notes (Unsigned)
Per Dr.Gudena, okay to treat with elevated AST/ALT. Notified infusion RN

## 2016-11-06 NOTE — Patient Instructions (Signed)
[facility] Discharge Instructions for Patients Receiving Chemotherapy  Today you received the following chemotherapy agents Taxol   To help prevent nausea and vomiting after your treatment, we encourage you to take your nausea medication as directed.   If you develop nausea and vomiting that is not controlled by your nausea medication, call the clinic.   BELOW ARE SYMPTOMS THAT SHOULD BE REPORTED IMMEDIATELY:  *FEVER GREATER THAN 100.5 F  *CHILLS WITH OR WITHOUT FEVER  NAUSEA AND VOMITING THAT IS NOT CONTROLLED WITH YOUR NAUSEA MEDICATION  *UNUSUAL SHORTNESS OF BREATH  *UNUSUAL BRUISING OR BLEEDING  TENDERNESS IN MOUTH AND THROAT WITH OR WITHOUT PRESENCE OF ULCERS  *URINARY PROBLEMS  *BOWEL PROBLEMS  UNUSUAL RASH Items with * indicate a potential emergency and should be followed up as soon as possible.  Feel free to call the clinic you have any questions or concerns. The clinic phone number is [phone].  Please show the CHEMO ALERT CARD at check-in to the Emergency Department and triage nurse.   

## 2016-11-06 NOTE — Progress Notes (Signed)
Patient Care Team: [name], MD as PCP - General (Family Medicine) [name], MD as Consulting Physician (General Surgery) Nicholas Lose, MD as Consulting Physician (Hematology and Oncology)  DIAGNOSIS:  Encounter Diagnosis  Name Primary?  . Malignant neoplasm of upper-inner quadrant of left breast in female, estrogen receptor negative ([place])     SUMMARY OF ONCOLOGIC HISTORY:   Malignant neoplasm of upper-inner quadrant of left breast in female, estrogen receptor negative ([place])   09/02/2016 Initial Diagnosis    Bilateral breast cancers: Left breast 11:30 position 6.3 cm with 3 abnormal lymph nodes biopsy of both IDC grade 2 LVI, triple negative, Ki-67 90% T3 N1 stage IIIB; right breast multiple masses 1.4 cm and 1:00, 5 mm 12:30 axilla negative biopsy ILC grade 2, ER 90%, PR 30%, Ki-67 10%, HER-2 negative ratio 1.74, T1 cN0 stage IA      09/25/2016 -  Neo-Adjuvant Chemotherapy    Taxol weekly x 12      10/03/2016 Genetic Testing    Underwent genetic counseling 10/03/2016. Declined genetic testing.       Malignant neoplasm of upper-inner quadrant of right breast in female, estrogen receptor positive ([place])   09/05/2016 Initial Diagnosis    Malignant neoplasm of upper-inner quadrant of right breast in female, estrogen receptor positive ([place])     10/03/2016 Genetic Testing    Underwent genetic counseling 10/03/2016. Declined genetic testing.       CHIEF COMPLIANT: Cycle 7 Taxol  INTERVAL HISTORY: Laurie Collins is a 76 year old with above-mentioned history of bilateral breast cancers currently on neoadjuvant chemotherapy and today is cycle 7 of Taxol. Overall she is tolerating treatment fairly well. She has mild nausea. She does have loose stools 3-4 times a day but she does not seem to be bothered by it. She does not think it is diarrhea. She has been gaining weight. Denies any neuropathy.  REVIEW OF SYSTEMS:   Constitutional: Denies fevers, chills or abnormal  weight loss Eyes: Denies blurriness of vision Ears, nose, mouth, throat, and face: Denies mucositis or sore throat Respiratory: Denies cough, dyspnea or wheezes Cardiovascular: Denies palpitation, chest discomfort Gastrointestinal:  Denies nausea, heartburn or change in bowel habits Skin: Denies abnormal skin rashes Lymphatics: Denies new lymphadenopathy or easy bruising Neurological:Denies numbness, tingling or new weaknesses Behavioral/Psych: Mood is stable, no new changes  Extremities: No lower extremity edema  All other systems were reviewed with the patient and are negative.  I have reviewed the past medical history, past surgical history, social history and family history with the patient and they are unchanged from previous note.  ALLERGIES:  is allergic to doxycycline and penicillins.  MEDICATIONS:  Current Outpatient Prescriptions  Medication Sig Dispense Refill  . amLODipine (NORVASC) 10 MG tablet Take 10 mg by mouth daily.    Marland Kitchen aspirin 81 MG chewable tablet Chew 81 mg by mouth daily.    . Cholecalciferol (VITAMIN D) 2000 units tablet Take 2,000 Units by mouth daily.    . clonazePAM (KLONOPIN) 0.5 MG tablet Take 0.5 mg by mouth at bedtime as needed for sleep or anxiety.    Marland Kitchen glimepiride (AMARYL) 1 MG tablet Take 1 mg by mouth daily with breakfast.    . hydrochlorothiazide (MICROZIDE) 12.5 MG capsule Take 12.5 mg by mouth daily.    Marland Kitchen lidocaine-prilocaine (EMLA) cream Apply to affected area once 30 g 3  . lisinopril (PRINIVIL,ZESTRIL) 20 MG tablet Take 20 mg by mouth daily.    Marland Kitchen loperamide (IMODIUM) 2 MG capsule Take  2 mg by mouth every 4 (four) hours as needed for diarrhea or loose stools.     Marland Kitchen LORazepam (ATIVAN) 1 MG tablet Take 1 tablet (1 mg total) by mouth at bedtime. 30 tablet 0  . metFORMIN (GLUCOPHAGE) 1000 MG tablet Take 1,000 mg by mouth 2 (two) times daily with a meal.    . metoprolol succinate (TOPROL-XL) 100 MG 24 hr tablet Take 100 mg by mouth daily.    .  ondansetron (ZOFRAN) 8 MG tablet Take 1 tablet (8 mg total) by mouth 2 (two) times daily as needed (Nausea or vomiting). 30 tablet 1  . oxyCODONE (OXY IR/ROXICODONE) 5 MG immediate release tablet Take 1-2 tablets (5-10 mg total) by mouth every 6 (six) hours as needed for moderate pain, severe pain or breakthrough pain. 15 tablet 0  . prochlorperazine (COMPAZINE) 10 MG tablet Take 1 tablet (10 mg total) by mouth every 6 (six) hours as needed (Nausea or vomiting). 30 tablet 1  . simvastatin (ZOCOR) 10 MG tablet Take 10 mg by mouth at bedtime.     No current facility-administered medications for this visit.     PHYSICAL EXAMINATION: ECOG PERFORMANCE STATUS: 1 - Symptomatic but completely ambulatory  Vitals:   11/06/16 0925  BP: (!) 149/71  Pulse: 88  Resp: 17  Temp: 98.1 F (36.7 C)   Filed Weights   11/06/16 0925  Weight: 185 lb 1.6 oz (84 kg)    GENERAL:alert, no distress and comfortable SKIN: skin color, texture, turgor are normal, no rashes or significant lesions EYES: normal, Conjunctiva are pink and non-injected, sclera clear OROPHARYNX:no exudate, no erythema and lips, buccal mucosa, and tongue normal  NECK: supple, thyroid normal size, non-tender, without nodularity LYMPH:  no palpable lymphadenopathy in the cervical, axillary or inguinal LUNGS: clear to auscultation and percussion with normal breathing effort HEART: regular rate & rhythm and no murmurs and no lower extremity edema ABDOMEN:abdomen soft, non-tender and normal bowel sounds MUSCULOSKELETAL:no cyanosis of digits and no clubbing  NEURO: alert & oriented x 3 with fluent speech, no focal motor/sensory deficits EXTREMITIES: No lower extremity edema  LABORATORY DATA:  I have reviewed the data as listed   Chemistry      Component Value Date/Time   NA 139 11/06/2016 0910   K 4.3 11/06/2016 0910   CL 100 (L) 09/19/2016 1130   CO2 27 11/06/2016 0910   BUN 21.1 11/06/2016 0910   CREATININE 0.9 11/06/2016 0910       Component Value Date/Time   CALCIUM 10.2 11/06/2016 0910   ALKPHOS 85 11/06/2016 0910   AST 73 (H) 11/06/2016 0910   ALT 102 (H) 11/06/2016 0910   BILITOT 0.33 11/06/2016 0910       Lab Results  Component Value Date   WBC 8.8 11/06/2016   HGB 10.4 (L) 11/06/2016   HCT 33.8 (L) 11/06/2016   MCV 74.9 (L) 11/06/2016   PLT 403 (H) 11/06/2016   NEUTROABS 4.6 11/06/2016    ASSESSMENT & PLAN:  Malignant neoplasm of upper-inner quadrant of left breast in female, estrogen receptor negative ([facility]) Bilateral breast cancers:  Left breast11:30 position 6.3 cm with 3 abnormal lymph nodes biopsy of both IDC grade 2 LVI, triple negative, Ki-67 90% T3 N1 stage IIIB; Right breastmultiple masses 1.4 cm and 1:00, 5 mm 12:30 axilla negative biopsy ILC grade 2, ER 90%, PR 30%, Ki-67 10%, HER-2 negative ratio 1.74, T1 cN0 stage IA  Treatment plan: 1. Neoadjuvant chemotherapy: Weekly Taxol 12 and if she tolerates  this well she will receive Adriamycin Cytoxan every 2 weeks 4 2. Followed by surgery 3. Followed by radiation 4. Followed by antiestrogen therapy because of right-sided breast cancer is ER positive ------------------------------------------------------------------- Current treatment: Weekly Taxol cycle 7 Chemotherapy toxicities: 1.Elevated LFTs: Dosed reduced with cycle 3, monitoring very closely 2. Alopecia 3. Nausea and vomiting: Patient does not want to take any medications  Carbohydrate addiction: I discussed with her about curbing her sugar and carbohydrate intake. Severe hypertension: Blood pressure today is 149 this 71  Return to clinic weekly for chemotherapy and every 2 weeks for follow-up with me   I spent 25 minutes talking to the patient of which more than half was spent in counseling and coordination of care.  No orders of the defined types were placed in this encounter.  The patient has a good understanding of the overall plan. she agrees with it. she will  call with any problems that may develop before the next visit here.   Rulon Eisenmenger, MD 11/06/16

## 2016-11-13 ENCOUNTER — Ambulatory Visit (HOSPITAL_BASED_OUTPATIENT_CLINIC_OR_DEPARTMENT_OTHER): Payer: Medicare HMO

## 2016-11-13 ENCOUNTER — Other Ambulatory Visit (HOSPITAL_BASED_OUTPATIENT_CLINIC_OR_DEPARTMENT_OTHER): Payer: Medicare HMO

## 2016-11-13 VITALS — BP 144/79 | HR 87 | Temp 98.1°F | Resp 20

## 2016-11-13 DIAGNOSIS — Z5111 Encounter for antineoplastic chemotherapy: Secondary | ICD-10-CM

## 2016-11-13 DIAGNOSIS — C50211 Malignant neoplasm of upper-inner quadrant of right female breast: Secondary | ICD-10-CM

## 2016-11-13 DIAGNOSIS — C50212 Malignant neoplasm of upper-inner quadrant of left female breast: Secondary | ICD-10-CM

## 2016-11-13 DIAGNOSIS — Z171 Estrogen receptor negative status [ER-]: Principal | ICD-10-CM

## 2016-11-13 LAB — CBC WITH DIFFERENTIAL/PLATELET
BASO%: 0.6 % (ref 0.0–2.0)
BASOS ABS: 0.1 10*3/uL (ref 0.0–0.1)
EOS%: 2.6 % (ref 0.0–7.0)
Eosinophils Absolute: 0.3 10*3/uL (ref 0.0–0.5)
HCT: 34.3 % — ABNORMAL LOW (ref 34.8–46.6)
HGB: 10.6 g/dL — ABNORMAL LOW (ref 11.6–15.9)
LYMPH%: 29.2 % (ref 14.0–49.7)
MCH: 23.7 pg — AB (ref 25.1–34.0)
MCHC: 30.9 g/dL — AB (ref 31.5–36.0)
MCV: 76.7 fL — AB (ref 79.5–101.0)
MONO#: 1 10*3/uL — AB (ref 0.1–0.9)
MONO%: 9.7 % (ref 0.0–14.0)
NEUT#: 5.9 10*3/uL (ref 1.5–6.5)
NEUT%: 57.9 % (ref 38.4–76.8)
PLATELETS: 356 10*3/uL (ref 145–400)
RBC: 4.47 10*6/uL (ref 3.70–5.45)
RDW: 25.8 % — ABNORMAL HIGH (ref 11.2–14.5)
WBC: 10.1 10*3/uL (ref 3.9–10.3)
lymph#: 3 10*3/uL (ref 0.9–3.3)

## 2016-11-13 LAB — COMPREHENSIVE METABOLIC PANEL
ALT: 123 U/L — AB (ref 0–55)
ANION GAP: 10 meq/L (ref 3–11)
AST: 80 U/L — ABNORMAL HIGH (ref 5–34)
Albumin: 3.7 g/dL (ref 3.5–5.0)
Alkaline Phosphatase: 85 U/L (ref 40–150)
BUN: 20.6 mg/dL (ref 7.0–26.0)
CALCIUM: 10.2 mg/dL (ref 8.4–10.4)
CHLORIDE: 101 meq/L (ref 98–109)
CO2: 27 mEq/L (ref 22–29)
CREATININE: 1 mg/dL (ref 0.6–1.1)
EGFR: 56 mL/min/{1.73_m2} — AB (ref >90)
Glucose: 157 mg/dl — ABNORMAL HIGH (ref 70–140)
POTASSIUM: 4.3 meq/L (ref 3.5–5.1)
Sodium: 138 mEq/L (ref 136–145)
Total Bilirubin: 0.44 mg/dL (ref 0.20–1.20)
Total Protein: 7.6 g/dL (ref 6.4–8.3)

## 2016-11-13 MED ORDER — FAMOTIDINE IN NACL 20-0.9 MG/50ML-% IV SOLN
INTRAVENOUS | Status: AC
  Filled 2016-11-13: qty 50

## 2016-11-13 MED ORDER — ONDANSETRON HCL 4 MG/2ML IJ SOLN
8.0000 mg | Freq: Once | INTRAMUSCULAR | Status: AC
  Administered 2016-11-13: 8 mg via INTRAVENOUS

## 2016-11-13 MED ORDER — DEXAMETHASONE SODIUM PHOSPHATE 10 MG/ML IJ SOLN
INTRAMUSCULAR | Status: AC
  Filled 2016-11-13: qty 1

## 2016-11-13 MED ORDER — DIPHENHYDRAMINE HCL 50 MG/ML IJ SOLN
25.0000 mg | Freq: Once | INTRAMUSCULAR | Status: AC
  Administered 2016-11-13: 25 mg via INTRAVENOUS

## 2016-11-13 MED ORDER — SODIUM CHLORIDE 0.9 % IV SOLN
Freq: Once | INTRAVENOUS | Status: AC
  Administered 2016-11-13: 10:00:00 via INTRAVENOUS

## 2016-11-13 MED ORDER — DEXAMETHASONE SODIUM PHOSPHATE 10 MG/ML IJ SOLN
10.0000 mg | Freq: Once | INTRAMUSCULAR | Status: AC
  Administered 2016-11-13: 10 mg via INTRAVENOUS

## 2016-11-13 MED ORDER — LORAZEPAM 2 MG/ML IJ SOLN
0.5000 mg | Freq: Once | INTRAMUSCULAR | Status: AC
  Administered 2016-11-13: 0.5 mg via INTRAVENOUS

## 2016-11-13 MED ORDER — ONDANSETRON HCL 4 MG/2ML IJ SOLN
INTRAMUSCULAR | Status: AC
  Filled 2016-11-13: qty 4

## 2016-11-13 MED ORDER — SODIUM CHLORIDE 0.9 % IV SOLN
65.0000 mg/m2 | Freq: Once | INTRAVENOUS | Status: AC
  Administered 2016-11-13: 126 mg via INTRAVENOUS
  Filled 2016-11-13: qty 21

## 2016-11-13 MED ORDER — SODIUM CHLORIDE 0.9% FLUSH
10.0000 mL | INTRAVENOUS | Status: DC | PRN
  Administered 2016-11-13: 10 mL
  Filled 2016-11-13: qty 10

## 2016-11-13 MED ORDER — DIPHENHYDRAMINE HCL 50 MG/ML IJ SOLN
INTRAMUSCULAR | Status: AC
  Filled 2016-11-13: qty 1

## 2016-11-13 MED ORDER — HEPARIN SOD (PORK) LOCK FLUSH 100 UNIT/ML IV SOLN
500.0000 [IU] | Freq: Once | INTRAVENOUS | Status: AC | PRN
Start: 2016-11-13 — End: 2016-11-13
  Administered 2016-11-13: 500 [IU]
  Filled 2016-11-13: qty 5

## 2016-11-13 MED ORDER — LORAZEPAM 2 MG/ML IJ SOLN
INTRAMUSCULAR | Status: AC
  Filled 2016-11-13: qty 1

## 2016-11-13 MED ORDER — FAMOTIDINE IN NACL 20-0.9 MG/50ML-% IV SOLN
20.0000 mg | Freq: Once | INTRAVENOUS | Status: AC
  Administered 2016-11-13: 20 mg via INTRAVENOUS

## 2016-11-13 NOTE — Progress Notes (Signed)
Okay to treat today despite increased AST/ALT today, per Dr. [name].

## 2016-11-13 NOTE — Patient Instructions (Signed)
[facility] Discharge Instructions for Patients Receiving Chemotherapy  Today you received the following chemotherapy agents Taxol  To help prevent nausea and vomiting after your treatment, we encourage you to take your nausea medication   If you develop nausea and vomiting that is not controlled by your nausea medication, call the clinic.   BELOW ARE SYMPTOMS THAT SHOULD BE REPORTED IMMEDIATELY:  *FEVER GREATER THAN 100.5 F  *CHILLS WITH OR WITHOUT FEVER  NAUSEA AND VOMITING THAT IS NOT CONTROLLED WITH YOUR NAUSEA MEDICATION  *UNUSUAL SHORTNESS OF BREATH  *UNUSUAL BRUISING OR BLEEDING  TENDERNESS IN MOUTH AND THROAT WITH OR WITHOUT PRESENCE OF ULCERS  *URINARY PROBLEMS  *BOWEL PROBLEMS  UNUSUAL RASH Items with * indicate a potential emergency and should be followed up as soon as possible.  Feel free to call the clinic you have any questions or concerns. The clinic phone number is [phone].  Please show the CHEMO ALERT CARD at check-in to the Emergency Department and triage nurse.   

## 2016-11-19 NOTE — Assessment & Plan Note (Signed)
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 Neoadjuvant chemotherapy: Weekly Taxol 12 and if she tolerates this well she will receive Adriamycin Cytoxan every 2 weeks 4 2. Followed by surgery 3. Followed by radiation 4. Followed by antiestrogen therapy because of right-sided breast cancer is ER positive ------------------------------------------------------------------- Current treatment: Weekly Taxol cycle 9 Chemotherapy toxicities: 1.Elevated LFTs: Dosed reduced with cycle 3 2. Alopecia 3. Nausea and vomiting: Patient does not want to take any medications  Carbohydrate addiction: I discussed with her about curbing her sugar and carbohydrate intake. Severe hypertension: Today's blood pressure is much better than before.  Return to clinic weekly for chemotherapy and every 2 weeks for follow-up with me

## 2016-11-20 ENCOUNTER — Ambulatory Visit (HOSPITAL_BASED_OUTPATIENT_CLINIC_OR_DEPARTMENT_OTHER): Payer: Medicare HMO

## 2016-11-20 ENCOUNTER — Encounter: Payer: Self-pay | Admitting: Hematology and Oncology

## 2016-11-20 ENCOUNTER — Other Ambulatory Visit (HOSPITAL_BASED_OUTPATIENT_CLINIC_OR_DEPARTMENT_OTHER): Payer: Medicare HMO

## 2016-11-20 ENCOUNTER — Ambulatory Visit (HOSPITAL_BASED_OUTPATIENT_CLINIC_OR_DEPARTMENT_OTHER): Payer: Medicare HMO | Admitting: Hematology and Oncology

## 2016-11-20 DIAGNOSIS — C50212 Malignant neoplasm of upper-inner quadrant of left female breast: Secondary | ICD-10-CM

## 2016-11-20 DIAGNOSIS — Z5111 Encounter for antineoplastic chemotherapy: Secondary | ICD-10-CM

## 2016-11-20 DIAGNOSIS — I10 Essential (primary) hypertension: Secondary | ICD-10-CM | POA: Diagnosis not present

## 2016-11-20 DIAGNOSIS — R7989 Other specified abnormal findings of blood chemistry: Secondary | ICD-10-CM | POA: Diagnosis not present

## 2016-11-20 DIAGNOSIS — Z170 Estrogen receptor positive status [ER+]: Secondary | ICD-10-CM

## 2016-11-20 DIAGNOSIS — C50211 Malignant neoplasm of upper-inner quadrant of right female breast: Secondary | ICD-10-CM | POA: Diagnosis not present

## 2016-11-20 DIAGNOSIS — Z171 Estrogen receptor negative status [ER-]: Secondary | ICD-10-CM | POA: Diagnosis not present

## 2016-11-20 DIAGNOSIS — L658 Other specified nonscarring hair loss: Secondary | ICD-10-CM | POA: Diagnosis not present

## 2016-11-20 DIAGNOSIS — R638 Other symptoms and signs concerning food and fluid intake: Secondary | ICD-10-CM | POA: Diagnosis not present

## 2016-11-20 LAB — CBC WITH DIFFERENTIAL/PLATELET
BASO%: 0.6 % (ref 0.0–2.0)
Basophils Absolute: 0.1 10*3/uL (ref 0.0–0.1)
EOS ABS: 0.1 10*3/uL (ref 0.0–0.5)
EOS%: 1.6 % (ref 0.0–7.0)
HCT: 32.1 % — ABNORMAL LOW (ref 34.8–46.6)
HGB: 9.9 g/dL — ABNORMAL LOW (ref 11.6–15.9)
LYMPH%: 32.2 % (ref 14.0–49.7)
MCH: 24.1 pg — ABNORMAL LOW (ref 25.1–34.0)
MCHC: 30.8 g/dL — AB (ref 31.5–36.0)
MCV: 78.1 fL — AB (ref 79.5–101.0)
MONO#: 0.8 10*3/uL (ref 0.1–0.9)
MONO%: 9.6 % (ref 0.0–14.0)
NEUT%: 56 % (ref 38.4–76.8)
NEUTROS ABS: 4.6 10*3/uL (ref 1.5–6.5)
Platelets: 336 10*3/uL (ref 145–400)
RBC: 4.11 10*6/uL (ref 3.70–5.45)
RDW: 26.4 % — ABNORMAL HIGH (ref 11.2–14.5)
WBC: 8.3 10*3/uL (ref 3.9–10.3)
lymph#: 2.7 10*3/uL (ref 0.9–3.3)

## 2016-11-20 LAB — COMPREHENSIVE METABOLIC PANEL
ALT: 101 U/L — ABNORMAL HIGH (ref 0–55)
AST: 76 U/L — ABNORMAL HIGH (ref 5–34)
Albumin: 3.4 g/dL — ABNORMAL LOW (ref 3.5–5.0)
Alkaline Phosphatase: 71 U/L (ref 40–150)
Anion Gap: 10 mEq/L (ref 3–11)
BUN: 19.5 mg/dL (ref 7.0–26.0)
CHLORIDE: 104 meq/L (ref 98–109)
CO2: 27 meq/L (ref 22–29)
Calcium: 9.9 mg/dL (ref 8.4–10.4)
Creatinine: 0.9 mg/dL (ref 0.6–1.1)
EGFR: 65 mL/min/{1.73_m2} — AB (ref >90)
GLUCOSE: 157 mg/dL — AB (ref 70–140)
POTASSIUM: 4 meq/L (ref 3.5–5.1)
SODIUM: 141 meq/L (ref 136–145)
TOTAL PROTEIN: 7.2 g/dL (ref 6.4–8.3)
Total Bilirubin: 0.42 mg/dL (ref 0.20–1.20)

## 2016-11-20 LAB — TECHNOLOGIST REVIEW: Technologist Review: 1

## 2016-11-20 MED ORDER — HEPARIN SOD (PORK) LOCK FLUSH 100 UNIT/ML IV SOLN
500.0000 [IU] | Freq: Once | INTRAVENOUS | Status: AC | PRN
Start: 2016-11-20 — End: 2016-11-20
  Administered 2016-11-20: 500 [IU]
  Filled 2016-11-20: qty 5

## 2016-11-20 MED ORDER — SODIUM CHLORIDE 0.9 % IV SOLN
Freq: Once | INTRAVENOUS | Status: AC
  Administered 2016-11-20: 11:00:00 via INTRAVENOUS

## 2016-11-20 MED ORDER — FAMOTIDINE IN NACL 20-0.9 MG/50ML-% IV SOLN
INTRAVENOUS | Status: AC
  Filled 2016-11-20: qty 50

## 2016-11-20 MED ORDER — DEXAMETHASONE SODIUM PHOSPHATE 10 MG/ML IJ SOLN
INTRAMUSCULAR | Status: AC
  Filled 2016-11-20: qty 1

## 2016-11-20 MED ORDER — DIPHENHYDRAMINE HCL 50 MG/ML IJ SOLN
INTRAMUSCULAR | Status: AC
  Filled 2016-11-20: qty 1

## 2016-11-20 MED ORDER — DIPHENHYDRAMINE HCL 50 MG/ML IJ SOLN
25.0000 mg | Freq: Once | INTRAMUSCULAR | Status: AC
  Administered 2016-11-20: 25 mg via INTRAVENOUS

## 2016-11-20 MED ORDER — ONDANSETRON HCL 4 MG/2ML IJ SOLN
8.0000 mg | Freq: Once | INTRAMUSCULAR | Status: AC
  Administered 2016-11-20: 8 mg via INTRAVENOUS

## 2016-11-20 MED ORDER — ONDANSETRON HCL 4 MG/2ML IJ SOLN
INTRAMUSCULAR | Status: AC
  Filled 2016-11-20: qty 2

## 2016-11-20 MED ORDER — SODIUM CHLORIDE 0.9% FLUSH
10.0000 mL | INTRAVENOUS | Status: DC | PRN
  Administered 2016-11-20: 10 mL
  Filled 2016-11-20: qty 10

## 2016-11-20 MED ORDER — DEXAMETHASONE SODIUM PHOSPHATE 10 MG/ML IJ SOLN
10.0000 mg | Freq: Once | INTRAMUSCULAR | Status: AC
  Administered 2016-11-20: 10 mg via INTRAVENOUS

## 2016-11-20 MED ORDER — SODIUM CHLORIDE 0.9 % IV SOLN
65.0000 mg/m2 | Freq: Once | INTRAVENOUS | Status: AC
  Administered 2016-11-20: 126 mg via INTRAVENOUS
  Filled 2016-11-20: qty 21

## 2016-11-20 MED ORDER — FAMOTIDINE IN NACL 20-0.9 MG/50ML-% IV SOLN
20.0000 mg | Freq: Once | INTRAVENOUS | Status: AC
  Administered 2016-11-20: 20 mg via INTRAVENOUS

## 2016-11-20 NOTE — Progress Notes (Signed)
Patient Care Team: [name], MD as PCP - General (Family Medicine) [name], MD as Consulting Physician (General Surgery) Nicholas Lose, MD as Consulting Physician (Hematology and Oncology)  DIAGNOSIS:  Encounter Diagnosis  Name Primary?  . Malignant neoplasm of upper-inner quadrant of left breast in female, estrogen receptor negative ([place])     SUMMARY OF ONCOLOGIC HISTORY:   Malignant neoplasm of upper-inner quadrant of left breast in female, estrogen receptor negative ([place])   09/02/2016 Initial Diagnosis    Bilateral breast cancers: Left breast 11:30 position 6.3 cm with 3 abnormal lymph nodes biopsy of both IDC grade 2 LVI, triple negative, Ki-67 90% T3 N1 stage IIIB; right breast multiple masses 1.4 cm and 1:00, 5 mm 12:30 axilla negative biopsy ILC grade 2, ER 90%, PR 30%, Ki-67 10%, HER-2 negative ratio 1.74, T1 cN0 stage IA      09/25/2016 -  Neo-Adjuvant Chemotherapy    Taxol weekly x 12      10/03/2016 Genetic Testing    Underwent genetic counseling 10/03/2016. Declined genetic testing.       Malignant neoplasm of upper-inner quadrant of right breast in female, estrogen receptor positive ([place])   09/05/2016 Initial Diagnosis    Malignant neoplasm of upper-inner quadrant of right breast in female, estrogen receptor positive ([place])     10/03/2016 Genetic Testing    Underwent genetic counseling 10/03/2016. Declined genetic testing.       CHIEF COMPLIANT: Cycle 9 weekly Taxol  INTERVAL HISTORY: Laurie Collins is a 76 year old with above-mentioned history of left breast cancer currently on neoadjuvant chemotherapy with weekly Taxol. The plan is to administer Adriamycin Cytoxan after she completes 12 cycles of weekly Taxol. Overall she appears to be tolerating this treatment fairly well. Does not have any nausea vomiting. She had elevated LFTs which were monitoring very closely. Denies neuropathy.  REVIEW OF SYSTEMS:   Constitutional: Denies fevers, chills or  abnormal weight loss Eyes: Denies blurriness of vision Ears, nose, mouth, throat, and face: Denies mucositis or sore throat Respiratory: Denies cough, dyspnea or wheezes Cardiovascular: Denies palpitation, chest discomfort Gastrointestinal:  Denies nausea, heartburn or change in bowel habits Skin: Denies abnormal skin rashes Lymphatics: Denies new lymphadenopathy or easy bruising Neurological:Denies numbness, tingling or new weaknesses Behavioral/Psych: Mood is stable, no new changes  Extremities: No lower extremity edema All other systems were reviewed with the patient and are negative.  I have reviewed the past medical history, past surgical history, social history and family history with the patient and they are unchanged from previous note.  ALLERGIES:  is allergic to doxycycline and penicillins.  MEDICATIONS:  Current Outpatient Prescriptions  Medication Sig Dispense Refill  . amLODipine (NORVASC) 10 MG tablet Take 10 mg by mouth daily.    Marland Kitchen aspirin 81 MG chewable tablet Chew 81 mg by mouth daily.    . Cholecalciferol (VITAMIN D) 2000 units tablet Take 2,000 Units by mouth daily.    . clonazePAM (KLONOPIN) 0.5 MG tablet Take 0.5 mg by mouth at bedtime as needed for sleep or anxiety.    Marland Kitchen glimepiride (AMARYL) 1 MG tablet Take 1 mg by mouth daily with breakfast.    . hydrochlorothiazide (MICROZIDE) 12.5 MG capsule Take 12.5 mg by mouth daily.    Marland Kitchen lidocaine-prilocaine (EMLA) cream Apply to affected area once 30 g 3  . lisinopril (PRINIVIL,ZESTRIL) 20 MG tablet Take 20 mg by mouth daily.    Marland Kitchen loperamide (IMODIUM) 2 MG capsule Take 2 mg by mouth every 4 (four)  hours as needed for diarrhea or loose stools.     Marland Kitchen LORazepam (ATIVAN) 1 MG tablet Take 1 tablet (1 mg total) by mouth at bedtime. 30 tablet 0  . metFORMIN (GLUCOPHAGE) 1000 MG tablet Take 1,000 mg by mouth 2 (two) times daily with a meal.    . metoprolol succinate (TOPROL-XL) 100 MG 24 hr tablet Take 100 mg by mouth daily.      . ondansetron (ZOFRAN) 8 MG tablet Take 1 tablet (8 mg total) by mouth 2 (two) times daily as needed (Nausea or vomiting). 30 tablet 1  . oxyCODONE (OXY IR/ROXICODONE) 5 MG immediate release tablet Take 1-2 tablets (5-10 mg total) by mouth every 6 (six) hours as needed for moderate pain, severe pain or breakthrough pain. 15 tablet 0  . prochlorperazine (COMPAZINE) 10 MG tablet Take 1 tablet (10 mg total) by mouth every 6 (six) hours as needed (Nausea or vomiting). 30 tablet 1  . simvastatin (ZOCOR) 10 MG tablet Take 10 mg by mouth at bedtime.     No current facility-administered medications for this visit.     PHYSICAL EXAMINATION: ECOG PERFORMANCE STATUS: 1 - Symptomatic but completely ambulatory  Vitals:   11/20/16 0921  BP: (!) 148/66  Pulse: 85  Resp: 19  Temp: 98.4 F (36.9 C)   Filed Weights   11/20/16 0921  Weight: 187 lb 1.6 oz (84.9 kg)    GENERAL:alert, no distress and comfortable SKIN: skin color, texture, turgor are normal, no rashes or significant lesions EYES: normal, Conjunctiva are pink and non-injected, sclera clear OROPHARYNX:no exudate, no erythema and lips, buccal mucosa, and tongue normal  NECK: supple, thyroid normal size, non-tender, without nodularity LYMPH:  no palpable lymphadenopathy in the cervical, axillary or inguinal LUNGS: clear to auscultation and percussion with normal breathing effort HEART: regular rate & rhythm and no murmurs and no lower extremity edema ABDOMEN:abdomen soft, non-tender and normal bowel sounds MUSCULOSKELETAL:no cyanosis of digits and no clubbing  NEURO: alert & oriented x 3 with fluent speech, no focal motor/sensory deficits EXTREMITIES: No lower extremity edema  LABORATORY DATA:  I have reviewed the data as listed   Chemistry      Component Value Date/Time   NA 138 11/13/2016 0846   K 4.3 11/13/2016 0846   CL 100 (L) 09/19/2016 1130   CO2 27 11/13/2016 0846   BUN 20.6 11/13/2016 0846   CREATININE 1.0 11/13/2016  0846      Component Value Date/Time   CALCIUM 10.2 11/13/2016 0846   ALKPHOS 85 11/13/2016 0846   AST 80 (H) 11/13/2016 0846   ALT 123 (H) 11/13/2016 0846   BILITOT 0.44 11/13/2016 0846       Lab Results  Component Value Date   WBC 8.3 11/20/2016   HGB 9.9 (L) 11/20/2016   HCT 32.1 (L) 11/20/2016   MCV 78.1 (L) 11/20/2016   PLT 336 11/20/2016   NEUTROABS 4.6 11/20/2016    ASSESSMENT & PLAN:  Malignant neoplasm of upper-inner quadrant of left breast in female, estrogen receptor negative ([place]) Bilateral breast cancers:  Left breast11:30 position 6.3 cm with 3 abnormal lymph nodes biopsy of both IDC grade 2 LVI, triple negative, Ki-67 90% T3 N1 stage IIIB; Right breastmultiple masses 1.4 cm and 1:00, 5 mm 12:30 axilla negative biopsy ILC grade 2, ER 90%, PR 30%, Ki-67 10%, HER-2 negative ratio 1.74, T1 cN0 stage IA  Treatment plan: 1. Neoadjuvant chemotherapy: Weekly Taxol 12 and if she tolerates this well she will receive Adriamycin Cytoxan  every 2 weeks 4 2. Followed by surgery 3. Followed by radiation 4. Followed by antiestrogen therapy because of right-sided breast cancer is ER positive ------------------------------------------------------------------- Current treatment: Weekly Taxol cycle 9 Chemotherapy toxicities: 1.Elevated LFTs: Dosed reduced with cycle 3, watchful monitoring 2. Alopecia 3. 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 Severe hypertension: Today's blood pressure is much better than before.  Return to clinic weekly for chemotherapy and every 2 weeks for follow-up with me    I spent 25 minutes talking to the patient of which more than half was spent in counseling and coordination of care.  No orders of the defined types were placed in this encounter.  The patient has a good understanding of the overall plan. she agrees with it. she  will call with any problems that may develop before the next visit here.   Rulon Eisenmenger, MD 11/20/16

## 2016-11-20 NOTE — Patient Instructions (Signed)
[facility] Discharge Instructions for Patients Receiving Chemotherapy  Today you received the following chemotherapy agents:  Taxol  To help prevent nausea and vomiting after your treatment, we encourage you to take your nausea medication as prescribed.   If you develop nausea and vomiting that is not controlled by your nausea medication, call the clinic.   BELOW ARE SYMPTOMS THAT SHOULD BE REPORTED IMMEDIATELY:  *FEVER GREATER THAN 100.5 F  *CHILLS WITH OR WITHOUT FEVER  NAUSEA AND VOMITING THAT IS NOT CONTROLLED WITH YOUR NAUSEA MEDICATION  *UNUSUAL SHORTNESS OF BREATH  *UNUSUAL BRUISING OR BLEEDING  TENDERNESS IN MOUTH AND THROAT WITH OR WITHOUT PRESENCE OF ULCERS  *URINARY PROBLEMS  *BOWEL PROBLEMS  UNUSUAL RASH Items with * indicate a potential emergency and should be followed up as soon as possible.  Feel free to call the clinic you have any questions or concerns. The clinic phone number is [phone].  Please show the CHEMO ALERT CARD at check-in to the Emergency Department and triage nurse.   

## 2016-11-27 ENCOUNTER — Ambulatory Visit (HOSPITAL_BASED_OUTPATIENT_CLINIC_OR_DEPARTMENT_OTHER): Payer: Medicare HMO

## 2016-11-27 ENCOUNTER — Other Ambulatory Visit (HOSPITAL_BASED_OUTPATIENT_CLINIC_OR_DEPARTMENT_OTHER): Payer: Medicare HMO

## 2016-11-27 VITALS — BP 150/66 | HR 88 | Temp 98.1°F | Resp 17

## 2016-11-27 DIAGNOSIS — Z5111 Encounter for antineoplastic chemotherapy: Secondary | ICD-10-CM

## 2016-11-27 DIAGNOSIS — C50212 Malignant neoplasm of upper-inner quadrant of left female breast: Secondary | ICD-10-CM | POA: Diagnosis not present

## 2016-11-27 DIAGNOSIS — Z171 Estrogen receptor negative status [ER-]: Principal | ICD-10-CM

## 2016-11-27 DIAGNOSIS — C50211 Malignant neoplasm of upper-inner quadrant of right female breast: Secondary | ICD-10-CM

## 2016-11-27 LAB — CBC WITH DIFFERENTIAL/PLATELET
BASO%: 0.8 % (ref 0.0–2.0)
Basophils Absolute: 0.1 10*3/uL (ref 0.0–0.1)
EOS ABS: 0.2 10*3/uL (ref 0.0–0.5)
EOS%: 2.2 % (ref 0.0–7.0)
HEMATOCRIT: 32.3 % — AB (ref 34.8–46.6)
HGB: 10.5 g/dL — ABNORMAL LOW (ref 11.6–15.9)
LYMPH#: 3.2 10*3/uL (ref 0.9–3.3)
LYMPH%: 34.1 % (ref 14.0–49.7)
MCH: 25 pg — ABNORMAL LOW (ref 25.1–34.0)
MCHC: 32.4 g/dL (ref 31.5–36.0)
MCV: 77.4 fL — AB (ref 79.5–101.0)
MONO#: 0.9 10*3/uL (ref 0.1–0.9)
MONO%: 9 % (ref 0.0–14.0)
NEUT%: 53.9 % (ref 38.4–76.8)
NEUTROS ABS: 5.1 10*3/uL (ref 1.5–6.5)
PLATELETS: 378 10*3/uL (ref 145–400)
RBC: 4.18 10*6/uL (ref 3.70–5.45)
RDW: 29.1 % — ABNORMAL HIGH (ref 11.2–14.5)
WBC: 9.5 10*3/uL (ref 3.9–10.3)

## 2016-11-27 LAB — COMPREHENSIVE METABOLIC PANEL
ALBUMIN: 3.6 g/dL (ref 3.5–5.0)
ALK PHOS: 71 U/L (ref 40–150)
ALT: 96 U/L — AB (ref 0–55)
ANION GAP: 9 meq/L (ref 3–11)
AST: 63 U/L — ABNORMAL HIGH (ref 5–34)
BUN: 12.9 mg/dL (ref 7.0–26.0)
CALCIUM: 10.1 mg/dL (ref 8.4–10.4)
CO2: 27 mEq/L (ref 22–29)
CREATININE: 0.9 mg/dL (ref 0.6–1.1)
Chloride: 102 mEq/L (ref 98–109)
EGFR: 64 mL/min/{1.73_m2} — AB (ref >90)
Glucose: 162 mg/dl — ABNORMAL HIGH (ref 70–140)
Potassium: 4.2 mEq/L (ref 3.5–5.1)
Sodium: 138 mEq/L (ref 136–145)
TOTAL PROTEIN: 7.4 g/dL (ref 6.4–8.3)
Total Bilirubin: 0.47 mg/dL (ref 0.20–1.20)

## 2016-11-27 LAB — TECHNOLOGIST REVIEW

## 2016-11-27 MED ORDER — DEXAMETHASONE SODIUM PHOSPHATE 10 MG/ML IJ SOLN
INTRAMUSCULAR | Status: AC
  Filled 2016-11-27: qty 1

## 2016-11-27 MED ORDER — ONDANSETRON HCL 4 MG/2ML IJ SOLN
8.0000 mg | Freq: Once | INTRAMUSCULAR | Status: AC
Start: 2016-11-27 — End: 2016-11-27
  Administered 2016-11-27: 8 mg via INTRAVENOUS

## 2016-11-27 MED ORDER — SODIUM CHLORIDE 0.9 % IV SOLN
65.0000 mg/m2 | Freq: Once | INTRAVENOUS | Status: AC
  Administered 2016-11-27: 126 mg via INTRAVENOUS
  Filled 2016-11-27: qty 21

## 2016-11-27 MED ORDER — SODIUM CHLORIDE 0.9 % IV SOLN
Freq: Once | INTRAVENOUS | Status: AC
  Administered 2016-11-27: 10:00:00 via INTRAVENOUS

## 2016-11-27 MED ORDER — ONDANSETRON HCL 4 MG/2ML IJ SOLN
INTRAMUSCULAR | Status: AC
  Filled 2016-11-27: qty 4

## 2016-11-27 MED ORDER — FAMOTIDINE IN NACL 20-0.9 MG/50ML-% IV SOLN
INTRAVENOUS | Status: AC
  Filled 2016-11-27: qty 50

## 2016-11-27 MED ORDER — HEPARIN SOD (PORK) LOCK FLUSH 100 UNIT/ML IV SOLN
500.0000 [IU] | Freq: Once | INTRAVENOUS | Status: AC | PRN
Start: 2016-11-27 — End: 2016-11-27
  Administered 2016-11-27: 500 [IU]
  Filled 2016-11-27: qty 5

## 2016-11-27 MED ORDER — DIPHENHYDRAMINE HCL 50 MG/ML IJ SOLN
INTRAMUSCULAR | Status: AC
  Filled 2016-11-27: qty 1

## 2016-11-27 MED ORDER — DEXAMETHASONE SODIUM PHOSPHATE 10 MG/ML IJ SOLN
10.0000 mg | Freq: Once | INTRAMUSCULAR | Status: AC
  Administered 2016-11-27: 10 mg via INTRAVENOUS

## 2016-11-27 MED ORDER — DIPHENHYDRAMINE HCL 50 MG/ML IJ SOLN
25.0000 mg | Freq: Once | INTRAMUSCULAR | Status: AC
  Administered 2016-11-27: 25 mg via INTRAVENOUS

## 2016-11-27 MED ORDER — SODIUM CHLORIDE 0.9% FLUSH
10.0000 mL | INTRAVENOUS | Status: DC | PRN
  Administered 2016-11-27: 10 mL
  Filled 2016-11-27: qty 10

## 2016-11-27 MED ORDER — FAMOTIDINE IN NACL 20-0.9 MG/50ML-% IV SOLN
20.0000 mg | Freq: Once | INTRAVENOUS | Status: AC
  Administered 2016-11-27: 20 mg via INTRAVENOUS

## 2016-11-27 NOTE — Progress Notes (Signed)
OK to give Taxol today with elevated ALT of 96 per Dr Jana Hakim

## 2016-11-27 NOTE — Progress Notes (Signed)
OK to treat despite elevated ALT & AST per Dr Jana Hakim.  Order repeated & verified.

## 2016-11-27 NOTE — Patient Instructions (Signed)
[facility] Discharge Instructions for Patients Receiving Chemotherapy  Today you received the following chemotherapy agents Taxol  To help prevent nausea and vomiting after your treatment, we encourage you to take your nausea medication   If you develop nausea and vomiting that is not controlled by your nausea medication, call the clinic.   BELOW ARE SYMPTOMS THAT SHOULD BE REPORTED IMMEDIATELY:  *FEVER GREATER THAN 100.5 F  *CHILLS WITH OR WITHOUT FEVER  NAUSEA AND VOMITING THAT IS NOT CONTROLLED WITH YOUR NAUSEA MEDICATION  *UNUSUAL SHORTNESS OF BREATH  *UNUSUAL BRUISING OR BLEEDING  TENDERNESS IN MOUTH AND THROAT WITH OR WITHOUT PRESENCE OF ULCERS  *URINARY PROBLEMS  *BOWEL PROBLEMS  UNUSUAL RASH Items with * indicate a potential emergency and should be followed up as soon as possible.  Feel free to call the clinic you have any questions or concerns. The clinic phone number is [phone].  Please show the CHEMO ALERT CARD at check-in to the Emergency Department and triage nurse.   

## 2016-12-04 ENCOUNTER — Ambulatory Visit (HOSPITAL_BASED_OUTPATIENT_CLINIC_OR_DEPARTMENT_OTHER): Payer: Medicare HMO | Admitting: Hematology and Oncology

## 2016-12-04 ENCOUNTER — Other Ambulatory Visit (HOSPITAL_BASED_OUTPATIENT_CLINIC_OR_DEPARTMENT_OTHER): Payer: Medicare HMO

## 2016-12-04 ENCOUNTER — Ambulatory Visit (HOSPITAL_BASED_OUTPATIENT_CLINIC_OR_DEPARTMENT_OTHER): Payer: Medicare HMO

## 2016-12-04 ENCOUNTER — Encounter: Payer: Self-pay | Admitting: Hematology and Oncology

## 2016-12-04 DIAGNOSIS — C50212 Malignant neoplasm of upper-inner quadrant of left female breast: Secondary | ICD-10-CM

## 2016-12-04 DIAGNOSIS — Z171 Estrogen receptor negative status [ER-]: Secondary | ICD-10-CM

## 2016-12-04 DIAGNOSIS — C50211 Malignant neoplasm of upper-inner quadrant of right female breast: Secondary | ICD-10-CM

## 2016-12-04 DIAGNOSIS — Z5111 Encounter for antineoplastic chemotherapy: Secondary | ICD-10-CM

## 2016-12-04 DIAGNOSIS — D6481 Anemia due to antineoplastic chemotherapy: Secondary | ICD-10-CM | POA: Diagnosis not present

## 2016-12-04 DIAGNOSIS — I10 Essential (primary) hypertension: Secondary | ICD-10-CM | POA: Diagnosis not present

## 2016-12-04 LAB — CBC WITH DIFFERENTIAL/PLATELET
BASO%: 0.5 % (ref 0.0–2.0)
BASOS ABS: 0 10*3/uL (ref 0.0–0.1)
EOS%: 1.3 % (ref 0.0–7.0)
Eosinophils Absolute: 0.1 10*3/uL (ref 0.0–0.5)
HEMATOCRIT: 31.7 % — AB (ref 34.8–46.6)
HEMOGLOBIN: 9.9 g/dL — AB (ref 11.6–15.9)
LYMPH#: 2.6 10*3/uL (ref 0.9–3.3)
LYMPH%: 32.4 % (ref 14.0–49.7)
MCH: 25.3 pg (ref 25.1–34.0)
MCHC: 31.2 g/dL — AB (ref 31.5–36.0)
MCV: 81.1 fL (ref 79.5–101.0)
MONO#: 0.8 10*3/uL (ref 0.1–0.9)
MONO%: 9.5 % (ref 0.0–14.0)
NEUT#: 4.5 10*3/uL (ref 1.5–6.5)
NEUT%: 56.3 % (ref 38.4–76.8)
Platelets: 352 10*3/uL (ref 145–400)
RBC: 3.91 10*6/uL (ref 3.70–5.45)
RDW: 27.7 % — ABNORMAL HIGH (ref 11.2–14.5)
WBC: 7.9 10*3/uL (ref 3.9–10.3)
nRBC: 1 % — ABNORMAL HIGH (ref 0–0)

## 2016-12-04 LAB — COMPREHENSIVE METABOLIC PANEL
ALBUMIN: 3.5 g/dL (ref 3.5–5.0)
ALK PHOS: 67 U/L (ref 40–150)
ALT: 97 U/L — ABNORMAL HIGH (ref 0–55)
AST: 80 U/L — AB (ref 5–34)
Anion Gap: 8 mEq/L (ref 3–11)
BUN: 22.4 mg/dL (ref 7.0–26.0)
CALCIUM: 10 mg/dL (ref 8.4–10.4)
CHLORIDE: 104 meq/L (ref 98–109)
CO2: 28 mEq/L (ref 22–29)
Creatinine: 0.9 mg/dL (ref 0.6–1.1)
EGFR: 66 mL/min/{1.73_m2} — AB (ref >90)
Glucose: 147 mg/dl — ABNORMAL HIGH (ref 70–140)
POTASSIUM: 4.4 meq/L (ref 3.5–5.1)
SODIUM: 140 meq/L (ref 136–145)
Total Bilirubin: 0.5 mg/dL (ref 0.20–1.20)
Total Protein: 7.4 g/dL (ref 6.4–8.3)

## 2016-12-04 LAB — TECHNOLOGIST REVIEW

## 2016-12-04 MED ORDER — FAMOTIDINE IN NACL 20-0.9 MG/50ML-% IV SOLN
INTRAVENOUS | Status: AC
  Filled 2016-12-04: qty 50

## 2016-12-04 MED ORDER — DIPHENHYDRAMINE HCL 50 MG/ML IJ SOLN
25.0000 mg | Freq: Once | INTRAMUSCULAR | Status: AC
  Administered 2016-12-04: 25 mg via INTRAVENOUS

## 2016-12-04 MED ORDER — SODIUM CHLORIDE 0.9 % IV SOLN
65.0000 mg/m2 | Freq: Once | INTRAVENOUS | Status: AC
  Administered 2016-12-04: 126 mg via INTRAVENOUS
  Filled 2016-12-04: qty 21

## 2016-12-04 MED ORDER — DIPHENHYDRAMINE HCL 50 MG/ML IJ SOLN
INTRAMUSCULAR | Status: AC
  Filled 2016-12-04: qty 1

## 2016-12-04 MED ORDER — DEXAMETHASONE SODIUM PHOSPHATE 10 MG/ML IJ SOLN
10.0000 mg | Freq: Once | INTRAMUSCULAR | Status: AC
  Administered 2016-12-04: 10 mg via INTRAVENOUS

## 2016-12-04 MED ORDER — SODIUM CHLORIDE 0.9% FLUSH
10.0000 mL | INTRAVENOUS | Status: DC | PRN
  Administered 2016-12-04: 10 mL
  Filled 2016-12-04: qty 10

## 2016-12-04 MED ORDER — DEXAMETHASONE SODIUM PHOSPHATE 10 MG/ML IJ SOLN
INTRAMUSCULAR | Status: AC
  Filled 2016-12-04: qty 1

## 2016-12-04 MED ORDER — HEPARIN SOD (PORK) LOCK FLUSH 100 UNIT/ML IV SOLN
500.0000 [IU] | Freq: Once | INTRAVENOUS | Status: AC | PRN
Start: 2016-12-04 — End: 2016-12-04
  Administered 2016-12-04: 500 [IU]
  Filled 2016-12-04: qty 5

## 2016-12-04 MED ORDER — ONDANSETRON HCL 4 MG/2ML IJ SOLN
8.0000 mg | Freq: Once | INTRAMUSCULAR | Status: AC
  Administered 2016-12-04: 8 mg via INTRAVENOUS

## 2016-12-04 MED ORDER — FAMOTIDINE IN NACL 20-0.9 MG/50ML-% IV SOLN
20.0000 mg | Freq: Once | INTRAVENOUS | Status: AC
  Administered 2016-12-04: 20 mg via INTRAVENOUS

## 2016-12-04 MED ORDER — SODIUM CHLORIDE 0.9 % IV SOLN
Freq: Once | INTRAVENOUS | Status: AC
  Administered 2016-12-04: 11:00:00 via INTRAVENOUS

## 2016-12-04 MED ORDER — ONDANSETRON HCL 4 MG/2ML IJ SOLN
INTRAMUSCULAR | Status: AC
  Filled 2016-12-04: qty 4

## 2016-12-04 NOTE — Progress Notes (Signed)
Per Dr [name], [place] to treat today with AST 80 and ALT 97.

## 2016-12-04 NOTE — Progress Notes (Signed)
Patient Care Team: [name], MD as PCP - General (Family Medicine) [name], MD as Consulting Physician (General Surgery) Nicholas Lose, MD as Consulting Physician (Hematology and Oncology)  DIAGNOSIS:  Encounter Diagnosis  Name Primary?  . Malignant neoplasm of upper-inner quadrant of left breast in female, estrogen receptor negative ([place])     SUMMARY OF ONCOLOGIC HISTORY:   Malignant neoplasm of upper-inner quadrant of left breast in female, estrogen receptor negative ([place])   09/02/2016 Initial Diagnosis    Bilateral breast cancers: Left breast 11:30 position 6.3 cm with 3 abnormal lymph nodes biopsy of both IDC grade 2 LVI, triple negative, Ki-67 90% T3 N1 stage IIIB; right breast multiple masses 1.4 cm and 1:00, 5 mm 12:30 axilla negative biopsy ILC grade 2, ER 90%, PR 30%, Ki-67 10%, HER-2 negative ratio 1.74, T1 cN0 stage IA      09/25/2016 -  Neo-Adjuvant Chemotherapy    Taxol weekly x 12      10/03/2016 Genetic Testing    Underwent genetic counseling 10/03/2016. Declined genetic testing.       Malignant neoplasm of upper-inner quadrant of right breast in female, estrogen receptor positive ([place])   09/05/2016 Initial Diagnosis    Malignant neoplasm of upper-inner quadrant of right breast in female, estrogen receptor positive ([place])     10/03/2016 Genetic Testing    Underwent genetic counseling 10/03/2016. Declined genetic testing.       CHIEF COMPLIANT: Cycle 11 Taxol  INTERVAL HISTORY: Laurie Collins is a 76 year old with above-mentioned history of left breast cancer currently neoadjuvant chemotherapy and today is cycle 11 of Taxol. She is tolerating treatment extremely well apart from fatigue and mild nausea. She does not have neuropathy. She has been chronically anemic and her hemoglobin appears to be the same as before. She does have fatigue   REVIEW OF SYSTEMS:   Constitutional: Denies fevers, chills or abnormal weight loss Eyes: Denies blurriness  of vision Ears, nose, mouth, throat, and face: Denies mucositis or sore throat Respiratory: Denies cough, dyspnea or wheezes Cardiovascular: Denies palpitation, chest discomfort Gastrointestinal:  Denies nausea, heartburn or change in bowel habits Skin: Denies abnormal skin rashes Lymphatics: Denies new lymphadenopathy or easy bruising Neurological:Denies numbness, tingling or new weaknesses Behavioral/Psych: Mood is stable, no new changes  Extremities: No lower extremity edema Breast:  denies any pain or lumps or nodules in either breasts All other systems were reviewed with the patient and are negative.  I have reviewed the past medical history, past surgical history, social history and family history with the patient and they are unchanged from previous note.  ALLERGIES:  is allergic to doxycycline and penicillins.  MEDICATIONS:  Current Outpatient Prescriptions  Medication Sig Dispense Refill  . amLODipine (NORVASC) 10 MG tablet Take 10 mg by mouth daily.    Marland Kitchen aspirin 81 MG chewable tablet Chew 81 mg by mouth daily.    . Cholecalciferol (VITAMIN D) 2000 units tablet Take 2,000 Units by mouth daily.    . clonazePAM (KLONOPIN) 0.5 MG tablet Take 0.5 mg by mouth at bedtime as needed for sleep or anxiety.    Marland Kitchen glimepiride (AMARYL) 1 MG tablet Take 1 mg by mouth daily with breakfast.    . hydrochlorothiazide (MICROZIDE) 12.5 MG capsule Take 12.5 mg by mouth daily.    Marland Kitchen lidocaine-prilocaine (EMLA) cream Apply to affected area once 30 g 3  . lisinopril (PRINIVIL,ZESTRIL) 20 MG tablet Take 20 mg by mouth daily.    Marland Kitchen loperamide (IMODIUM) 2 MG  capsule Take 2 mg by mouth every 4 (four) hours as needed for diarrhea or loose stools.     Marland Kitchen LORazepam (ATIVAN) 1 MG tablet Take 1 tablet (1 mg total) by mouth at bedtime. 30 tablet 0  . metFORMIN (GLUCOPHAGE) 1000 MG tablet Take 1,000 mg by mouth 2 (two) times daily with a meal.    . metoprolol succinate (TOPROL-XL) 100 MG 24 hr tablet Take 100 mg by  mouth daily.    . ondansetron (ZOFRAN) 8 MG tablet Take 1 tablet (8 mg total) by mouth 2 (two) times daily as needed (Nausea or vomiting). 30 tablet 1  . oxyCODONE (OXY IR/ROXICODONE) 5 MG immediate release tablet Take 1-2 tablets (5-10 mg total) by mouth every 6 (six) hours as needed for moderate pain, severe pain or breakthrough pain. 15 tablet 0  . prochlorperazine (COMPAZINE) 10 MG tablet Take 1 tablet (10 mg total) by mouth every 6 (six) hours as needed (Nausea or vomiting). 30 tablet 1  . simvastatin (ZOCOR) 10 MG tablet Take 10 mg by mouth at bedtime.     No current facility-administered medications for this visit.     PHYSICAL EXAMINATION: ECOG PERFORMANCE STATUS: 1 - Symptomatic but completely ambulatory  Vitals:   12/04/16 0911  BP: 137/76  Pulse: 92  Resp: 18  Temp: 98.9 F (37.2 C)  SpO2: 100%   Filed Weights   12/04/16 0911  Weight: 187 lb 14.4 oz (85.2 kg)    GENERAL:alert, no distress and comfortable SKIN: skin color, texture, turgor are normal, no rashes or significant lesions EYES: normal, Conjunctiva are pink and non-injected, sclera clear OROPHARYNX:no exudate, no erythema and lips, buccal mucosa, and tongue normal  NECK: supple, thyroid normal size, non-tender, without nodularity LYMPH:  no palpable lymphadenopathy in the cervical, axillary or inguinal LUNGS: clear to auscultation and percussion with normal breathing effort HEART: regular rate & rhythm and no murmurs and no lower extremity edema ABDOMEN:abdomen soft, non-tender and normal bowel sounds MUSCULOSKELETAL:no cyanosis of digits and no clubbing  NEURO: alert & oriented x 3 with fluent speech, no focal motor/sensory deficits EXTREMITIES: No lower extremity edema   LABORATORY DATA:  I have reviewed the data as listed   Chemistry      Component Value Date/Time   NA 140 12/04/2016 0856   K 4.4 12/04/2016 0856   CL 100 (L) 09/19/2016 1130   CO2 28 12/04/2016 0856   BUN 22.4 12/04/2016 0856    CREATININE 0.9 12/04/2016 0856      Component Value Date/Time   CALCIUM 10.0 12/04/2016 0856   ALKPHOS 67 12/04/2016 0856   AST 80 (H) 12/04/2016 0856   ALT 97 (H) 12/04/2016 0856   BILITOT 0.50 12/04/2016 0856       Lab Results  Component Value Date   WBC 7.9 12/04/2016   HGB 9.9 (L) 12/04/2016   HCT 31.7 (L) 12/04/2016   MCV 81.1 12/04/2016   PLT 352 12/04/2016   NEUTROABS 4.5 12/04/2016    ASSESSMENT & PLAN:  Malignant neoplasm of upper-inner quadrant of left breast in female, estrogen receptor negative ([place]) Bilateral breast cancers:  Left breast11:30 position 6.3 cm with 3 abnormal lymph nodes biopsy of both IDC grade 2 LVI, triple negative, Ki-67 90% T3 N1 stage IIIB; Right breastmultiple masses 1.4 cm and 1:00, 5 mm 12:30 axilla negative biopsy ILC grade 2, ER 90%, PR 30%, Ki-67 10%, HER-2 negative ratio 1.74, T1 cN0 stage IA  Treatment plan: 1. Neoadjuvant chemotherapy: Weekly Taxol 12 and  if she tolerates this well she will receive Adriamycin Cytoxan every 2 weeks 4 2. Followed by surgery 3. Followed by radiation 4. Followed by antiestrogen therapy because of right-sided breast cancer is ER positive ------------------------------------------------------------------- Current treatment: Weekly Taxol cycle 11 Chemotherapy toxicities: 1.Elevated LFTs: Dosed reduced with cycle 3, watchful monitoring 2. Alopecia 3. Nausea and vomiting: Patient does not want to take any medications 4. 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 Severe hypertension: Today's blood pressure is much better than before.  Return to clinic in 1 week for last cycle of chemo. Patient will receive dose dense Adriamycin Cytoxan starting 2 weeks after cycle 12 of Taxol.    I spent 25 minutes talking to the patient of which more than half was spent in counseling and coordination of care.  No  orders of the defined types were placed in this encounter.  The patient has a good understanding of the overall plan. she agrees with it. she will call with any problems that may develop before the next visit here.   Rulon Eisenmenger, MD 12/04/16

## 2016-12-04 NOTE — Assessment & Plan Note (Signed)
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 Neoadjuvant chemotherapy: Weekly Taxol 12 and if she tolerates this well she will receive Adriamycin Cytoxan every 2 weeks 4 2. Followed by surgery 3. Followed by radiation 4. Followed by antiestrogen therapy because of right-sided breast cancer is ER positive ------------------------------------------------------------------- Current treatment: Weekly Taxol cycle 11 Chemotherapy toxicities: 1.Elevated LFTs: Dosed reduced with cycle 3, watchful monitoring 2. Alopecia 3. 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 Severe hypertension: Today's blood pressure is much better than before.  Return to clinic in 1 week for last cycle of chemo. Will arrange a follow-up MRI Followed by tumor board presentation and surgery

## 2016-12-04 NOTE — Patient Instructions (Signed)
[facility] Discharge Instructions for Patients Receiving Chemotherapy  Today you received the following chemotherapy agents Taxol  To help prevent nausea and vomiting after your treatment, we encourage you to take your nausea medication   If you develop nausea and vomiting that is not controlled by your nausea medication, call the clinic.   BELOW ARE SYMPTOMS THAT SHOULD BE REPORTED IMMEDIATELY:  *FEVER GREATER THAN 100.5 F  *CHILLS WITH OR WITHOUT FEVER  NAUSEA AND VOMITING THAT IS NOT CONTROLLED WITH YOUR NAUSEA MEDICATION  *UNUSUAL SHORTNESS OF BREATH  *UNUSUAL BRUISING OR BLEEDING  TENDERNESS IN MOUTH AND THROAT WITH OR WITHOUT PRESENCE OF ULCERS  *URINARY PROBLEMS  *BOWEL PROBLEMS  UNUSUAL RASH Items with * indicate a potential emergency and should be followed up as soon as possible.  Feel free to call the clinic you have any questions or concerns. The clinic phone number is [phone].  Please show the CHEMO ALERT CARD at check-in to the Emergency Department and triage nurse.   

## 2016-12-11 ENCOUNTER — Ambulatory Visit (HOSPITAL_BASED_OUTPATIENT_CLINIC_OR_DEPARTMENT_OTHER): Payer: Medicare HMO

## 2016-12-11 ENCOUNTER — Ambulatory Visit (HOSPITAL_BASED_OUTPATIENT_CLINIC_OR_DEPARTMENT_OTHER): Payer: Medicare HMO | Admitting: Medical

## 2016-12-11 ENCOUNTER — Other Ambulatory Visit (HOSPITAL_BASED_OUTPATIENT_CLINIC_OR_DEPARTMENT_OTHER): Payer: Medicare HMO

## 2016-12-11 VITALS — BP 151/74 | HR 80 | Temp 98.7°F

## 2016-12-11 DIAGNOSIS — Z5111 Encounter for antineoplastic chemotherapy: Secondary | ICD-10-CM | POA: Diagnosis not present

## 2016-12-11 DIAGNOSIS — C50212 Malignant neoplasm of upper-inner quadrant of left female breast: Secondary | ICD-10-CM

## 2016-12-11 DIAGNOSIS — L270 Generalized skin eruption due to drugs and medicaments taken internally: Secondary | ICD-10-CM

## 2016-12-11 DIAGNOSIS — Z171 Estrogen receptor negative status [ER-]: Principal | ICD-10-CM

## 2016-12-11 LAB — COMPREHENSIVE METABOLIC PANEL
ALT: 94 U/L — ABNORMAL HIGH (ref 0–55)
ANION GAP: 8 meq/L (ref 3–11)
AST: 58 U/L — ABNORMAL HIGH (ref 5–34)
Albumin: 3.5 g/dL (ref 3.5–5.0)
Alkaline Phosphatase: 61 U/L (ref 40–150)
BILIRUBIN TOTAL: 0.44 mg/dL (ref 0.20–1.20)
BUN: 21.5 mg/dL (ref 7.0–26.0)
CO2: 26 meq/L (ref 22–29)
Calcium: 9.8 mg/dL (ref 8.4–10.4)
Chloride: 105 mEq/L (ref 98–109)
Creatinine: 0.9 mg/dL (ref 0.6–1.1)
EGFR: 65 mL/min/{1.73_m2} — AB (ref >90)
GLUCOSE: 155 mg/dL — AB (ref 70–140)
Potassium: 4.3 mEq/L (ref 3.5–5.1)
SODIUM: 139 meq/L (ref 136–145)
TOTAL PROTEIN: 7.1 g/dL (ref 6.4–8.3)

## 2016-12-11 LAB — CBC WITH DIFFERENTIAL/PLATELET
BASO%: 0.8 % (ref 0.0–2.0)
BASOS ABS: 0.1 10*3/uL (ref 0.0–0.1)
EOS ABS: 0.1 10*3/uL (ref 0.0–0.5)
EOS%: 1.5 % (ref 0.0–7.0)
HCT: 30.6 % — ABNORMAL LOW (ref 34.8–46.6)
HGB: 9.7 g/dL — ABNORMAL LOW (ref 11.6–15.9)
LYMPH%: 26.9 % (ref 14.0–49.7)
MCH: 25.9 pg (ref 25.1–34.0)
MCHC: 31.5 g/dL (ref 31.5–36.0)
MCV: 82 fL (ref 79.5–101.0)
MONO#: 0.7 10*3/uL (ref 0.1–0.9)
MONO%: 9.5 % (ref 0.0–14.0)
NEUT#: 4.7 10*3/uL (ref 1.5–6.5)
NEUT%: 61.3 % (ref 38.4–76.8)
PLATELETS: 352 10*3/uL (ref 145–400)
RBC: 3.73 10*6/uL (ref 3.70–5.45)
RDW: 29.8 % — ABNORMAL HIGH (ref 11.2–14.5)
WBC: 7.7 10*3/uL (ref 3.9–10.3)
lymph#: 2.1 10*3/uL (ref 0.9–3.3)

## 2016-12-11 LAB — TECHNOLOGIST REVIEW: Technologist Review: 1

## 2016-12-11 MED ORDER — HEPARIN SOD (PORK) LOCK FLUSH 100 UNIT/ML IV SOLN
500.0000 [IU] | Freq: Once | INTRAVENOUS | Status: AC | PRN
  Administered 2016-12-11: 500 [IU]
  Filled 2016-12-11: qty 5

## 2016-12-11 MED ORDER — FAMOTIDINE IN NACL 20-0.9 MG/50ML-% IV SOLN
20.0000 mg | Freq: Once | INTRAVENOUS | Status: AC
  Administered 2016-12-11: 20 mg via INTRAVENOUS

## 2016-12-11 MED ORDER — SODIUM CHLORIDE 0.9 % IV SOLN
Freq: Once | INTRAVENOUS | Status: AC
  Administered 2016-12-11: 13:00:00 via INTRAVENOUS
  Filled 2016-12-11: qty 2

## 2016-12-11 MED ORDER — FAMOTIDINE IN NACL 20-0.9 MG/50ML-% IV SOLN
INTRAVENOUS | Status: AC
  Filled 2016-12-11: qty 50

## 2016-12-11 MED ORDER — METHYLPREDNISOLONE 4 MG PO TABS: ORAL_TABLET | ORAL | 0 refills | Status: AC

## 2016-12-11 MED ORDER — DIPHENHYDRAMINE HCL 50 MG/ML IJ SOLN
25.0000 mg | Freq: Once | INTRAMUSCULAR | Status: AC
  Administered 2016-12-11: 25 mg via INTRAVENOUS

## 2016-12-11 MED ORDER — SODIUM CHLORIDE 0.9% FLUSH
10.0000 mL | INTRAVENOUS | Status: DC | PRN
  Administered 2016-12-11: 10 mL
  Filled 2016-12-11: qty 10

## 2016-12-11 MED ORDER — ONDANSETRON HCL 4 MG/2ML IJ SOLN
INTRAMUSCULAR | Status: AC
  Filled 2016-12-11: qty 4

## 2016-12-11 MED ORDER — DIPHENHYDRAMINE HCL 50 MG/ML IJ SOLN
INTRAMUSCULAR | Status: AC
  Filled 2016-12-11: qty 1

## 2016-12-11 MED ORDER — SODIUM CHLORIDE 0.9 % IV SOLN
65.0000 mg/m2 | Freq: Once | INTRAVENOUS | Status: AC
  Administered 2016-12-11: 126 mg via INTRAVENOUS
  Filled 2016-12-11: qty 21

## 2016-12-11 MED ORDER — DEXAMETHASONE SODIUM PHOSPHATE 10 MG/ML IJ SOLN: 10.0000 mg | Freq: Once | INTRAMUSCULAR | Status: DC

## 2016-12-11 MED ORDER — SODIUM CHLORIDE 0.9 % IV SOLN
Freq: Once | INTRAVENOUS | Status: AC
  Administered 2016-12-11: 13:00:00 via INTRAVENOUS

## 2016-12-11 MED ORDER — ONDANSETRON HCL 4 MG/2ML IJ SOLN
8.0000 mg | Freq: Once | INTRAMUSCULAR | Status: AC
  Administered 2016-12-11: 8 mg via INTRAVENOUS

## 2016-12-11 NOTE — Progress Notes (Unsigned)
Per Dr.Gudena, okay to treat pt with Taxol today with AST 58/ALT 94.Notified infusion RN.

## 2016-12-11 NOTE — Patient Instructions (Signed)
[facility] Discharge Instructions for Patients Receiving Chemotherapy  Today you received the following chemotherapy agents: Taxol.   To help prevent nausea and vomiting after your treatment, we encourage you to take your nausea medication: Zofran. Take one every 8 hours as needed.    If you develop nausea and vomiting that is not controlled by your nausea medication, call the clinic.   BELOW ARE SYMPTOMS THAT SHOULD BE REPORTED IMMEDIATELY:  *FEVER GREATER THAN 100.5 F  *CHILLS WITH OR WITHOUT FEVER  NAUSEA AND VOMITING THAT IS NOT CONTROLLED WITH YOUR NAUSEA MEDICATION  *UNUSUAL SHORTNESS OF BREATH  *UNUSUAL BRUISING OR BLEEDING  TENDERNESS IN MOUTH AND THROAT WITH OR WITHOUT PRESENCE OF ULCERS  *URINARY PROBLEMS  *BOWEL PROBLEMS  UNUSUAL RASH Items with * indicate a potential emergency and should be followed up as soon as possible.  Feel free to call the clinic should you have any questions or concerns. The clinic phone number is [phone].  Please show the CHEMO ALERT CARD at check-in to the Emergency Department and triage nurse.   

## 2016-12-11 NOTE — Progress Notes (Signed)
[facility] Progress Note   Laurie Collins is managed by Dr. Nicholas Lose   Actively treated with chemotherapy: yes  Current Therapy: Paclitaxel    Last Treated: December 11, 2016 Cycle 12 dosed today  Assessment/Plan:   Adverse effect of drug, initial encounter - Plan: methylPREDNISolone (MEDROL) 4 MG tablet  Drug-induced skin rash - Plan: methylPREDNISolone (MEDROL) 4 MG tablet  It was believed that the patient had a drug rash secondary to paclitaxel. She was given an additional 10 mg of IV Decadron as a premed. Additionally she was given a prescription for a Medrol Dosepak.  The patient has a follow-up appointment with Wilber Bihari, NP on 12/25/2016.  Subjective:   Patient ID:  Laurie Collins is a 76 y.o. (DOB 12/23/1940) female.  Chief Complaint: No chief complaint on file.   HPI Laurie Collins for cycle 12 of paclitaxel. She reports a diffuse erythematous rash on her left lateral upper extremity, left anterior ankle and right posterior elbow which was noted today. The rash is not pruritic. The patient denies any burning or irritation associated with this rash. She has had no changes in any personal hygiene products and has not had any exposure to any plants. She reported being a little more short of breath this morning then her baseline. She was noted to have oxygen saturation of 100% however. She denies any oral tenderness, chest tightness, or difficulty swallowing.  Medications: I have reviewed the patient's current medications.  Allergies:  Allergies  Allergen Reactions  . Doxycycline Anaphylaxis  . Penicillins Hives and Swelling    Has patient had a PCN reaction causing immediate rash, facial/tongue/throat swelling, SOB or lightheadedness with hypotension: Yes Has patient had a PCN reaction causing severe rash involving mucus membranes or skin necrosis: No Has patient had a PCN reaction that required hospitalization: No Has patient had a  PCN reaction occurring within the last 10 years: No If all of the above answers are "NO", then may proceed with Cephalosporin use.     Past Medical History, Surgical history, Social history, and Family history were reviewed and updated as appropriate.   Please see review of systems for further details on the patient's review from today.   Review of Systems:  Review of Systems  Constitutional: Negative for activity change.  HENT: Negative for facial swelling, mouth sores and trouble swallowing.   Respiratory: Positive for shortness of breath. Negative for chest tightness.   Cardiovascular: Negative for chest pain.  Skin: Positive for rash.    Objective:   Physical Exam:  There were no vitals taken for this visit. ECOG: 1  Physical Exam  Constitutional: No distress.  HENT:  Head: Normocephalic.  Cardiovascular: Normal rate, regular rhythm and normal heart sounds.  Exam reveals no gallop and no friction rub.   No murmur heard. Pulmonary/Chest: Effort normal and breath sounds normal. No respiratory distress. She has no wheezes. She has no rales.  Neurological: She is alert.  Skin: Skin is warm and dry. She is not diaphoretic.     Psychiatric: She has a normal mood and affect. Her behavior is normal. Judgment and thought content normal.   This patient was seen with Dr. [name] with my treatment plan reviewed with him. He expressed agreement with my medical management of this patient.  Attending Note  I personally saw and examined Laurie Collins. The plan of care was discussed with her. I agree with the assessment and plan as documented above. Drug rash:  I agree with the above-mentioned assessment and plan. We will follow it and make sure that it does not get worse. Signed Rulon Eisenmenger, MD

## 2016-12-11 NOTE — Progress Notes (Signed)
Sandi Mealy, PA in to see pt and evaluate rash on B arms and L ankle. Order received for additional 10 mg Dex for 20mg  total pre-Taxol. Pharmacy notified.

## 2016-12-25 ENCOUNTER — Telehealth: Payer: Self-pay

## 2016-12-25 ENCOUNTER — Ambulatory Visit (HOSPITAL_BASED_OUTPATIENT_CLINIC_OR_DEPARTMENT_OTHER): Payer: Medicare HMO | Admitting: Adult Health

## 2016-12-25 ENCOUNTER — Other Ambulatory Visit: Payer: Self-pay | Admitting: Oncology

## 2016-12-25 ENCOUNTER — Other Ambulatory Visit (HOSPITAL_BASED_OUTPATIENT_CLINIC_OR_DEPARTMENT_OTHER): Payer: Medicare HMO

## 2016-12-25 ENCOUNTER — Ambulatory Visit (HOSPITAL_BASED_OUTPATIENT_CLINIC_OR_DEPARTMENT_OTHER): Payer: Medicare HMO

## 2016-12-25 ENCOUNTER — Encounter: Payer: Self-pay | Admitting: Adult Health

## 2016-12-25 VITALS — BP 136/62 | HR 87 | Temp 98.2°F | Resp 19 | Wt 190.1 lb

## 2016-12-25 DIAGNOSIS — C50212 Malignant neoplasm of upper-inner quadrant of left female breast: Secondary | ICD-10-CM

## 2016-12-25 DIAGNOSIS — Z171 Estrogen receptor negative status [ER-]: Secondary | ICD-10-CM | POA: Diagnosis not present

## 2016-12-25 DIAGNOSIS — Z170 Estrogen receptor positive status [ER+]: Secondary | ICD-10-CM

## 2016-12-25 DIAGNOSIS — C50211 Malignant neoplasm of upper-inner quadrant of right female breast: Secondary | ICD-10-CM

## 2016-12-25 DIAGNOSIS — I10 Essential (primary) hypertension: Secondary | ICD-10-CM

## 2016-12-25 LAB — COMPREHENSIVE METABOLIC PANEL
ALT: 43 U/L (ref 0–55)
AST: 34 U/L (ref 5–34)
Albumin: 3.5 g/dL (ref 3.5–5.0)
Alkaline Phosphatase: 68 U/L (ref 40–150)
Anion Gap: 10 mEq/L (ref 3–11)
BILIRUBIN TOTAL: 0.52 mg/dL (ref 0.20–1.20)
BUN: 16.3 mg/dL (ref 7.0–26.0)
CHLORIDE: 103 meq/L (ref 98–109)
CO2: 26 meq/L (ref 22–29)
CREATININE: 0.9 mg/dL (ref 0.6–1.1)
Calcium: 9.7 mg/dL (ref 8.4–10.4)
EGFR: 59 mL/min/{1.73_m2} — ABNORMAL LOW (ref >90)
GLUCOSE: 175 mg/dL — AB (ref 70–140)
Potassium: 4.3 mEq/L (ref 3.5–5.1)
SODIUM: 140 meq/L (ref 136–145)
TOTAL PROTEIN: 7.4 g/dL (ref 6.4–8.3)

## 2016-12-25 LAB — CBC WITH DIFFERENTIAL/PLATELET
BASO%: 0.4 % (ref 0.0–2.0)
BASOS ABS: 0 10*3/uL (ref 0.0–0.1)
EOS ABS: 0.1 10*3/uL (ref 0.0–0.5)
EOS%: 1.2 % (ref 0.0–7.0)
HCT: 34.6 % — ABNORMAL LOW (ref 34.8–46.6)
HEMOGLOBIN: 10.6 g/dL — AB (ref 11.6–15.9)
LYMPH#: 2.4 10*3/uL (ref 0.9–3.3)
LYMPH%: 21.6 % (ref 14.0–49.7)
MCH: 26.4 pg (ref 25.1–34.0)
MCHC: 30.6 g/dL — AB (ref 31.5–36.0)
MCV: 86.1 fL (ref 79.5–101.0)
MONO#: 2.1 10*3/uL — ABNORMAL HIGH (ref 0.1–0.9)
MONO%: 18.7 % — ABNORMAL HIGH (ref 0.0–14.0)
NEUT#: 6.5 10*3/uL (ref 1.5–6.5)
NEUT%: 58.1 % (ref 38.4–76.8)
NRBC: 0 % (ref 0–0)
PLATELETS: 275 10*3/uL (ref 145–400)
RBC: 4.02 10*6/uL (ref 3.70–5.45)
RDW: 25.5 % — AB (ref 11.2–14.5)
WBC: 11.2 10*3/uL — ABNORMAL HIGH (ref 3.9–10.3)

## 2016-12-25 MED ORDER — DEXAMETHASONE 4 MG PO TABS: 8.0000 mg | ORAL_TABLET | Freq: Every day | ORAL | 1 refills | Status: AC

## 2016-12-25 MED ORDER — HEPARIN SOD (PORK) LOCK FLUSH 100 UNIT/ML IV SOLN
500.0000 [IU] | Freq: Once | INTRAVENOUS | Status: AC
  Administered 2016-12-25: 500 [IU] via INTRAVENOUS
  Filled 2016-12-25: qty 5

## 2016-12-25 MED ORDER — SODIUM CHLORIDE 0.9% FLUSH
10.0000 mL | Freq: Once | INTRAVENOUS | Status: AC
  Administered 2016-12-25: 10 mL via INTRAVENOUS
  Filled 2016-12-25: qty 10

## 2016-12-25 MED ORDER — TRIAMCINOLONE 0.1 % CREAM:EUCERIN CREAM 1:1: 1.0000 "application " | TOPICAL_CREAM | Freq: Two times a day (BID) | CUTANEOUS | 1 refills | Status: AC | PRN

## 2016-12-25 NOTE — Progress Notes (Signed)
Per Wilber Bihari, NP  Hold pt treatment today because pt has not had an echo done yet.  Mendel Ryder will order the echo and the pt was instructed to go by scheduling to schedule the appointment.

## 2016-12-25 NOTE — Progress Notes (Signed)
[facility] Follow up:    Laurie Jordan, MD [address] [place] [address]   DIAGNOSIS: Cancer Staging Malignant neoplasm of upper-inner quadrant of left breast in female, estrogen receptor negative ([place]) Staging form: Breast, AJCC 8th Edition - Clinical stage from 09/11/2016: Stage IIIB (cT2, cN1, cM0, G3, ER: Negative, PR: Negative, HER2: Negative) - Unsigned Staging comments: Staged at breast conference on 5.30.18  Malignant neoplasm of upper-inner quadrant of right breast in female, estrogen receptor positive ([place]) Staging form: Breast, AJCC 8th Edition - Clinical stage from 09/11/2016: Stage IA (cT1c, cN0, cM0, G2, ER: Positive, PR: Positive, HER2: Negative) - Unsigned Staging comments: Staged at breast conference on 5.30.18   SUMMARY OF ONCOLOGIC HISTORY:   Malignant neoplasm of upper-inner quadrant of left breast in female, estrogen receptor negative ([place])   09/02/2016 Initial Diagnosis    Bilateral breast cancers: Left breast 11:30 position 6.3 cm with 3 abnormal lymph nodes biopsy of both IDC grade 2 LVI, triple negative, Ki-67 90% T3 N1 stage IIIB; right breast multiple masses 1.4 cm and 1:00, 5 mm 12:30 axilla negative biopsy ILC grade 2, ER 90%, PR 30%, Ki-67 10%, HER-2 negative ratio 1.74, T1 cN0 stage IA      09/25/2016 -  Neo-Adjuvant Chemotherapy    Taxol weekly x 12      10/03/2016 Genetic Testing    Underwent genetic counseling 10/03/2016. Declined genetic testing.       Malignant neoplasm of upper-inner quadrant of right breast in female, estrogen receptor positive ([place])   09/05/2016 Initial Diagnosis    Malignant neoplasm of upper-inner quadrant of right breast in female, estrogen receptor positive ([place])     10/03/2016 Genetic Testing    Underwent genetic counseling 10/03/2016. Declined genetic testing.       CURRENT THERAPY: Adriamcyin/Cytoxan cycle 1 day 1  INTERVAL HISTORY: Laurie Collins 76 y.o. female  returns for evaluation prior to receiving chemotherapy.  She received 12 weeks of Taxol first.  She tolerated it moderately well.  She is fatigued.  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  She is very confused as to what anti-nausea medications to take and how to take them.  She is also concerned about her breast erythema.  She says it has been present since diagnosis.     Patient Active Problem List   Diagnosis Date Noted  . Genetic testing 10/04/2016  . Malignant neoplasm of upper-inner quadrant of left breast in female, estrogen receptor negative ([place]) 09/05/2016  . Malignant neoplasm of upper-inner quadrant of right breast in female, estrogen receptor positive ([place]) 09/05/2016    is allergic to doxycycline and penicillins.  MEDICAL HISTORY: Past Medical History:  Diagnosis Date  . Anxiety   . Cancer ([place])    breast  . COPD (chronic obstructive pulmonary disease) ([place])   . Depression   . Diabetes mellitus without complication ([place])   . Dyspnea   . Hypertension   . PONV (postoperative nausea and vomiting)    very,very,very sick from anesthesia  . Tuberculosis    1962    SURGICAL HISTORY: Past Surgical History:  Procedure Laterality Date  . BREAST SURGERY     5 breast lumpectomy  . PORTACATH PLACEMENT Right 09/24/2016   Procedure: INSERTION PORT-A-CATH;  Surgeon: [name], MD;  Location: [facility];  Service: General;  Laterality: Right;    SOCIAL HISTORY: Social History   Social History  .  Marital status: Widowed    Spouse name: N/A  . Number of children: N/A  . Years of education: N/A   Occupational History  . Not on file.   Social History Main Topics  . Smoking status: Former Smoker    Packs/day: 2.00    Years: 25.00    Types: Cigarettes    Quit date: 2013  . Smokeless tobacco: Never Used  . Alcohol use No  . Drug use: No  . Sexual activity: Not on file   Other Topics Concern  . Not on  file   Social History Narrative  . No narrative on file    FAMILY HISTORY: Family History  Problem Relation Age of Onset  . Stomach cancer Mother 66       d.67 from metastatic disease  . Cancer Maternal Aunt        Unspecified type  . Cancer Maternal Uncle        Unspecified type    Review of Systems  Constitutional: Positive for fatigue. Negative for appetite change, chills, diaphoresis and fever.  HENT:   Negative for hearing loss and lump/mass.   Eyes: Negative for eye problems.  Respiratory: Negative for chest tightness, cough and shortness of breath.   Cardiovascular: Negative for chest pain, leg swelling and palpitations.  Gastrointestinal: Negative for abdominal distention, abdominal pain, constipation, diarrhea, nausea and vomiting.  Endocrine: Negative for hot flashes.  Musculoskeletal: Negative for arthralgias.  Skin: Negative for itching and rash.  Neurological: Positive for numbness. Negative for dizziness and extremity weakness.  Hematological: Negative for adenopathy. Does not bruise/bleed easily.  Psychiatric/Behavioral: Negative for depression. The patient is not nervous/anxious.       PHYSICAL EXAMINATION  ECOG PERFORMANCE STATUS: 2 - Symptomatic, <50% confined to bed  Vitals:   12/25/16 1023  BP: 136/62  Pulse: 87  Resp: 19  Temp: 98.2 F (36.8 C)  SpO2: 99%    Physical Exam  Constitutional: She is oriented to person, place, and time and well-developed, well-nourished, and in no distress.  HENT:  Head: Normocephalic and atraumatic.  Mouth/Throat: No oropharyngeal exudate.  Eyes: Pupils are equal, round, and reactive to light. No scleral icterus.  Neck: Neck supple.  Cardiovascular: Normal rate, regular rhythm and normal heart sounds.   Pulmonary/Chest: Effort normal and breath sounds normal. No respiratory distress. She has no wheezes.  Breast with blanchable erythema covering a good portion of the breast approx 8cm, nipple distortion present  (unchanged per patient).    Abdominal: Soft. Bowel sounds are normal.  Musculoskeletal: She exhibits no edema.  Lymphadenopathy:    She has no cervical adenopathy.  Neurological: She is alert and oriented to person, place, and time.  Skin: Skin is warm and dry. No rash noted.  Rash on right arm is improving, skin is darkened now, but appears to be healing, there is no erythema, but there are signs of visible scratching  Psychiatric: Mood and affect normal.    LABORATORY DATA:  CBC    Component Value Date/Time   WBC 11.2 (H) 12/25/2016 0956   WBC 12.7 (H) 09/19/2016 1130   RBC 4.02 12/25/2016 0956   RBC 4.86 09/19/2016 1130   HGB 10.6 (L) 12/25/2016 0956   HCT 34.6 (L) 12/25/2016 0956   PLT 275 12/25/2016 0956   MCV 86.1 12/25/2016 0956   MCH 26.4 12/25/2016 0956   MCH 21.4 (L) 09/19/2016 1130   MCHC 30.6 (L) 12/25/2016 0956   MCHC 30.1 09/19/2016 1130   RDW 25.5 (  H) 12/25/2016 0956   LYMPHSABS 2.4 12/25/2016 0956   MONOABS 2.1 (H) 12/25/2016 0956   EOSABS 0.1 12/25/2016 0956   BASOSABS 0.0 12/25/2016 0956    CMP     Component Value Date/Time   NA 140 12/25/2016 0956   K 4.3 12/25/2016 0956   CL 100 (L) 09/19/2016 1130   CO2 26 12/25/2016 0956   GLUCOSE 175 (H) 12/25/2016 0956   BUN 16.3 12/25/2016 0956   CREATININE 0.9 12/25/2016 0956   CALCIUM 9.7 12/25/2016 0956   PROT 7.4 12/25/2016 0956   ALBUMIN 3.5 12/25/2016 0956   AST 34 12/25/2016 0956   ALT 43 12/25/2016 0956   ALKPHOS 68 12/25/2016 0956   BILITOT 0.52 12/25/2016 0956   GFRNONAA 46 (L) 09/19/2016 1130   GFRAA 54 (L) 09/19/2016 1130       PENDING LABS:   RADIOGRAPHIC STUDIES:  No results found.   PATHOLOGY:     ASSESSMENT and THERAPY PLAN:   Malignant neoplasm of upper-inner quadrant of left breast in female, estrogen receptor negative ([place]) Bilateral breast cancers:  Left breast11:30 position 6.3 cm with 3 abnormal lymph nodes biopsy of both IDC grade 2 LVI, triple negative, Ki-67  90% T3 N1 stage IIIB; Right breastmultiple masses 1.4 cm and 1:00, 5 mm 12:30 axilla negative biopsy ILC grade 2, ER 90%, PR 30%, Ki-67 10%, HER-2 negative ratio 1.74, T1 cN0 stage IA  Treatment plan: 1. Neoadjuvant chemotherapy: Weekly Taxol 12 and Adriamycin Cytoxan every 2 weeks 4 2. Followed by surgery 3. Followed by radiation 4. Followed by antiestrogen therapy because of right-sided breast cancer is ER positive ------------------------------------------------------------------- Current treatment: Adriamycin/Cytoxan Chemotherapy toxicities:  Patient needs echocardiogram and will have this done this week and return for chemotherapy next week.  She did have a rash that was treated last week with Medrol Dosepak, it is healing, however I will send in some cream for her at her request.  I also gave her a chart on how to take her anti-nausea medications, and Claritin following her chemotherapy.  I spent a great deal of time reviewing this with her and sending it in for her to take.    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 Severe hypertension: Today's blood pressure is much better than before.     Orders Placed This Encounter  Procedures  . ECHOCARDIOGRAM COMPLETE    Standing Status:   Future    Standing Expiration Date:   03/26/2018    Order Specific Question:   Where should this test be performed    Answer:   [place] [facility] [facility]    Order Specific Question:   Perflutren DEFINITY (image enhancing agent) should be administered unless hypersensitivity or allergy exist    Answer:   Administer Perflutren    Order Specific Question:   Expected Date:    Answer:   ASAP    All questions were answered. The patient knows to call the clinic with any problems, questions or concerns. We can certainly see the patient much sooner if necessary.  A total of (30) minutes of face-to-face time was spent with this patient with greater than 50% of that time in  counseling and care-coordination.  This note was electronically signed. Scot Dock, NP 12/25/2016

## 2016-12-25 NOTE — Assessment & Plan Note (Addendum)
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 Neoadjuvant chemotherapy: Weekly Taxol 12 and Adriamycin Cytoxan every 2 weeks 4 2. Followed by surgery 3. Followed by radiation 4. Followed by antiestrogen therapy because of right-sided breast cancer is ER positive ------------------------------------------------------------------- Current treatment: Adriamycin/Cytoxan Chemotherapy toxicities:  Patient needs echocardiogram and will have this done this week and return for chemotherapy next week.  She did have a rash that was treated last week with Medrol Dosepak, it is healing, however I will send in some cream for her at her request.  I also gave her a chart on how to take her anti-nausea medications, and Claritin following her chemotherapy.  I spent a great deal of time reviewing this with her and sending it in for her to take.    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 Severe hypertension: Today's blood pressure is much better than before.

## 2016-12-25 NOTE — Telephone Encounter (Signed)
Patient scheduled for echocardigram for 9/17.  Per los 9/12

## 2016-12-30 ENCOUNTER — Ambulatory Visit (HOSPITAL_COMMUNITY)
Admission: RE | Admit: 2016-12-30 | Discharge: 2016-12-30 | Disposition: A | Discharge status: Home / Self Care | Payer: Medicare HMO | Source: Ambulatory Visit | Attending: Adult Health | Admitting: Adult Health

## 2016-12-30 DIAGNOSIS — Z170 Estrogen receptor positive status [ER+]: Secondary | ICD-10-CM | POA: Insufficient documentation

## 2016-12-30 DIAGNOSIS — C50212 Malignant neoplasm of upper-inner quadrant of left female breast: Secondary | ICD-10-CM | POA: Diagnosis not present

## 2016-12-30 DIAGNOSIS — Z7982 Long term (current) use of aspirin: Secondary | ICD-10-CM | POA: Diagnosis not present

## 2016-12-30 DIAGNOSIS — C50211 Malignant neoplasm of upper-inner quadrant of right female breast: Secondary | ICD-10-CM | POA: Diagnosis not present

## 2016-12-30 DIAGNOSIS — Z171 Estrogen receptor negative status [ER-]: Secondary | ICD-10-CM | POA: Diagnosis not present

## 2016-12-30 DIAGNOSIS — Z7984 Long term (current) use of oral hypoglycemic drugs: Secondary | ICD-10-CM | POA: Insufficient documentation

## 2016-12-30 DIAGNOSIS — Z79899 Other long term (current) drug therapy: Secondary | ICD-10-CM | POA: Insufficient documentation

## 2016-12-30 NOTE — Progress Notes (Signed)
  Echocardiogram 2D Echocardiogram has been performed.  Catalino Plascencia L Androw 12/30/2016, 10:52 AM

## 2016-12-31 ENCOUNTER — Other Ambulatory Visit: Payer: Self-pay

## 2016-12-31 DIAGNOSIS — Z171 Estrogen receptor negative status [ER-]: Principal | ICD-10-CM

## 2016-12-31 DIAGNOSIS — C50212 Malignant neoplasm of upper-inner quadrant of left female breast: Secondary | ICD-10-CM

## 2017-01-01 ENCOUNTER — Other Ambulatory Visit (HOSPITAL_BASED_OUTPATIENT_CLINIC_OR_DEPARTMENT_OTHER): Payer: Medicare HMO

## 2017-01-01 ENCOUNTER — Telehealth: Payer: Self-pay | Admitting: Hematology and Oncology

## 2017-01-01 ENCOUNTER — Encounter: Payer: Self-pay | Admitting: Hematology and Oncology

## 2017-01-01 ENCOUNTER — Ambulatory Visit (HOSPITAL_BASED_OUTPATIENT_CLINIC_OR_DEPARTMENT_OTHER): Payer: Medicare HMO | Admitting: Hematology and Oncology

## 2017-01-01 ENCOUNTER — Ambulatory Visit (HOSPITAL_BASED_OUTPATIENT_CLINIC_OR_DEPARTMENT_OTHER): Payer: Medicare HMO

## 2017-01-01 DIAGNOSIS — C50212 Malignant neoplasm of upper-inner quadrant of left female breast: Secondary | ICD-10-CM

## 2017-01-01 DIAGNOSIS — C50211 Malignant neoplasm of upper-inner quadrant of right female breast: Secondary | ICD-10-CM

## 2017-01-01 DIAGNOSIS — Z5111 Encounter for antineoplastic chemotherapy: Secondary | ICD-10-CM | POA: Diagnosis not present

## 2017-01-01 DIAGNOSIS — Z171 Estrogen receptor negative status [ER-]: Principal | ICD-10-CM

## 2017-01-01 LAB — COMPREHENSIVE METABOLIC PANEL
ALBUMIN: 3.4 g/dL — AB (ref 3.5–5.0)
ALK PHOS: 71 U/L (ref 40–150)
ALT: 50 U/L (ref 0–55)
AST: 53 U/L — ABNORMAL HIGH (ref 5–34)
Anion Gap: 11 mEq/L (ref 3–11)
BUN: 18.7 mg/dL (ref 7.0–26.0)
CALCIUM: 9.9 mg/dL (ref 8.4–10.4)
CO2: 26 mEq/L (ref 22–29)
Chloride: 102 mEq/L (ref 98–109)
Creatinine: 1 mg/dL (ref 0.6–1.1)
EGFR: 57 mL/min/{1.73_m2} — AB (ref >90)
Glucose: 248 mg/dl — ABNORMAL HIGH (ref 70–140)
POTASSIUM: 4 meq/L (ref 3.5–5.1)
Sodium: 139 mEq/L (ref 136–145)
Total Bilirubin: 0.45 mg/dL (ref 0.20–1.20)
Total Protein: 7.8 g/dL (ref 6.4–8.3)

## 2017-01-01 LAB — CBC WITH DIFFERENTIAL/PLATELET
BASO%: 0.4 % (ref 0.0–2.0)
BASOS ABS: 0.1 10*3/uL (ref 0.0–0.1)
EOS ABS: 0.2 10*3/uL (ref 0.0–0.5)
EOS%: 1.4 % (ref 0.0–7.0)
HEMATOCRIT: 33.7 % — AB (ref 34.8–46.6)
HEMOGLOBIN: 10.6 g/dL — AB (ref 11.6–15.9)
LYMPH#: 2.5 10*3/uL (ref 0.9–3.3)
LYMPH%: 18.3 % (ref 14.0–49.7)
MCH: 26.7 pg (ref 25.1–34.0)
MCHC: 31.6 g/dL (ref 31.5–36.0)
MCV: 84.6 fL (ref 79.5–101.0)
MONO#: 1.4 10*3/uL — ABNORMAL HIGH (ref 0.1–0.9)
MONO%: 10.1 % (ref 0.0–14.0)
NEUT#: 9.7 10*3/uL — ABNORMAL HIGH (ref 1.5–6.5)
NEUT%: 69.8 % (ref 38.4–76.8)
Platelets: 292 10*3/uL (ref 145–400)
RBC: 3.98 10*6/uL (ref 3.70–5.45)
RDW: 25.1 % — AB (ref 11.2–14.5)
WBC: 13.8 10*3/uL — ABNORMAL HIGH (ref 3.9–10.3)

## 2017-01-01 MED ORDER — PALONOSETRON HCL INJECTION 0.25 MG/5ML
0.2500 mg | Freq: Once | INTRAVENOUS | Status: AC
  Administered 2017-01-01: 0.25 mg via INTRAVENOUS

## 2017-01-01 MED ORDER — DOXORUBICIN HCL CHEMO IV INJECTION 2 MG/ML
50.0000 mg/m2 | Freq: Once | INTRAVENOUS | Status: AC
  Administered 2017-01-01: 98 mg via INTRAVENOUS
  Filled 2017-01-01: qty 49

## 2017-01-01 MED ORDER — SODIUM CHLORIDE 0.9% FLUSH
10.0000 mL | INTRAVENOUS | Status: DC | PRN
  Administered 2017-01-01: 10 mL
  Filled 2017-01-01: qty 10

## 2017-01-01 MED ORDER — SODIUM CHLORIDE 0.9 % IV SOLN
Freq: Once | INTRAVENOUS | Status: AC
  Administered 2017-01-01: 14:00:00 via INTRAVENOUS
  Filled 2017-01-01: qty 5

## 2017-01-01 MED ORDER — SODIUM CHLORIDE 0.9 % IV SOLN
Freq: Once | INTRAVENOUS | Status: AC
  Administered 2017-01-01: 13:00:00 via INTRAVENOUS

## 2017-01-01 MED ORDER — PALONOSETRON HCL INJECTION 0.25 MG/5ML
INTRAVENOUS | Status: AC
  Filled 2017-01-01: qty 5

## 2017-01-01 MED ORDER — PEGFILGRASTIM 6 MG/0.6ML ~~LOC~~ PSKT
6.0000 mg | PREFILLED_SYRINGE | Freq: Once | SUBCUTANEOUS | Status: AC
  Administered 2017-01-01: 6 mg via SUBCUTANEOUS
  Filled 2017-01-01: qty 0.6

## 2017-01-01 MED ORDER — HEPARIN SOD (PORK) LOCK FLUSH 100 UNIT/ML IV SOLN
500.0000 [IU] | Freq: Once | INTRAVENOUS | Status: AC | PRN
  Administered 2017-01-01: 500 [IU]
  Filled 2017-01-01: qty 5

## 2017-01-01 MED ORDER — SODIUM CHLORIDE 0.9 % IV SOLN
500.0000 mg/m2 | Freq: Once | INTRAVENOUS | Status: AC
  Administered 2017-01-01: 980 mg via INTRAVENOUS
  Filled 2017-01-01: qty 49

## 2017-01-01 NOTE — Assessment & Plan Note (Signed)
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 Neoadjuvant chemotherapy: Weekly Taxol 12 and Adriamycin Cytoxan every 2 weeks 4 2. Followed by surgery 3. Followed by radiation 4. Followed by antiestrogen therapy because of right-sided breast cancer is ER positive ------------------------------------------------------------------- Current treatment: Adriamycin/Cytoxan cycle 1 day 1 Echocardiogram 12/30/2016: EF 65-70% Labs have been reviewed Education provided I will see the patient next week to review toxicities

## 2017-01-01 NOTE — Telephone Encounter (Signed)
Scheduled appts per 9/18 sch msg. Patient is getting an updated schedule in infusion.

## 2017-01-01 NOTE — Patient Instructions (Signed)
[facility] Discharge Instructions for Patients Receiving Chemotherapy  Today you received the following chemotherapy agents: Adriamycin, Cytoxan    To help prevent nausea and vomiting after your treatment, we encourage you to take your nausea medication as prescribed.   If you develop nausea and vomiting that is not controlled by your nausea medication, call the clinic.   BELOW ARE SYMPTOMS THAT SHOULD BE REPORTED IMMEDIATELY:  *FEVER GREATER THAN 100.5 F  *CHILLS WITH OR WITHOUT FEVER  NAUSEA AND VOMITING THAT IS NOT CONTROLLED WITH YOUR NAUSEA MEDICATION  *UNUSUAL SHORTNESS OF BREATH  *UNUSUAL BRUISING OR BLEEDING  TENDERNESS IN MOUTH AND THROAT WITH OR WITHOUT PRESENCE OF ULCERS  *URINARY PROBLEMS  *BOWEL PROBLEMS  UNUSUAL RASH Items with * indicate a potential emergency and should be followed up as soon as possible.  Feel free to call the clinic you have any questions or concerns. The clinic phone number is [phone].  Please show the [place] at check-in to the Emergency Department and triage nurse.  Doxorubicin injection What is this medicine? DOXORUBICIN (dox oh ROO bi sin) is a chemotherapy drug. It is used to treat many kinds of cancer like leukemia, lymphoma, neuroblastoma, sarcoma, and Wilms' tumor. It is also used to treat bladder cancer, breast cancer, lung cancer, ovarian cancer, stomach cancer, and thyroid cancer. This medicine may be used for other purposes; ask your health care provider or pharmacist if you have questions. COMMON BRAND NAME(S): Adriamycin, Adriamycin PFS, Adriamycin RDF, Rubex What should I tell my health care provider before I take this medicine? They need to know if you have any of these conditions: -heart disease -history of low blood counts caused by a medicine -liver disease -recent or ongoing radiation therapy -an unusual or allergic reaction to doxorubicin, other chemotherapy agents, other medicines,  foods, dyes, or preservatives -pregnant or trying to get pregnant -breast-feeding How should I use this medicine? This drug is given as an infusion into a vein. It is administered in a hospital or clinic by a specially trained health care professional. If you have pain, swelling, burning or any unusual feeling around the site of your injection, tell your health care professional right away. Talk to your pediatrician regarding the use of this medicine in children. Special care may be needed. Overdosage: If you think you have taken too much of this medicine contact a poison control center or emergency room at once. NOTE: This medicine is only for you. Do not share this medicine with others. What if I miss a dose? It is important not to miss your dose. Call your doctor or health care professional if you are unable to keep an appointment. What may interact with this medicine? This medicine may interact with the following medications: -6-mercaptopurine -paclitaxel -phenytoin -St. John's Wort -trastuzumab -verapamil This list may not describe all possible interactions. Give your health care provider a list of all the medicines, herbs, non-prescription drugs, or dietary supplements you use. Also tell them if you smoke, drink alcohol, or use illegal drugs. Some items may interact with your medicine. What should I watch for while using this medicine? This drug may make you feel generally unwell. This is not uncommon, as chemotherapy can affect healthy cells as well as cancer cells. Report any side effects. Continue your course of treatment even though you feel ill unless your doctor tells you to stop. There is a maximum amount of this medicine you should receive throughout your life. The amount depends on  the medical condition being treated and your overall health. Your doctor will watch how much of this medicine you receive in your lifetime. Tell your doctor if you have taken this medicine before. You  may need blood work done while you are taking this medicine. Your urine may turn red for a few days after your dose. This is not blood. If your urine is dark or brown, call your doctor. In some cases, you may be given additional medicines to help with side effects. Follow all directions for their use. Call your doctor or health care professional for advice if you get a fever, chills or sore throat, or other symptoms of a cold or flu. Do not treat yourself. This drug decreases your body's ability to fight infections. Try to avoid being around people who are sick. This medicine may increase your risk to bruise or bleed. Call your doctor or health care professional if you notice any unusual bleeding. Talk to your doctor about your risk of cancer. You may be more at risk for certain types of cancers if you take this medicine. Do not become pregnant while taking this medicine or for 6 months after stopping it. Women should inform their doctor if they wish to become pregnant or think they might be pregnant. Men should not father a child while taking this medicine and for 6 months after stopping it. There is a potential for serious side effects to an unborn child. Talk to your health care professional or pharmacist for more information. Do not breast-feed an infant while taking this medicine. This medicine has caused ovarian failure in some women and reduced sperm counts in some men This medicine may interfere with the ability to have a child. Talk with your doctor or health care professional if you are concerned about your fertility. What side effects may I notice from receiving this medicine? Side effects that you should report to your doctor or health care professional as soon as possible: -allergic reactions like skin rash, itching or hives, swelling of the face, lips, or tongue -breathing problems -chest pain -fast or irregular heartbeat -low blood counts - this medicine may decrease the number of white  blood cells, red blood cells and platelets. You may be at increased risk for infections and bleeding. -pain, redness, or irritation at site where injected -signs of infection - fever or chills, cough, sore throat, pain or difficulty passing urine -signs of decreased platelets or bleeding - bruising, pinpoint red spots on the skin, black, tarry stools, blood in the urine -swelling of the ankles, feet, hands -tiredness -weakness Side effects that usually do not require medical attention (report to your doctor or health care professional if they continue or are bothersome): -diarrhea -hair loss -mouth sores -nail discoloration or damage -nausea -red colored urine -vomiting This list may not describe all possible side effects. Call your doctor for medical advice about side effects. You may report side effects to FDA at 1-800-FDA-1088. Where should I keep my medicine? This drug is given in a hospital or clinic and will not be stored at home. NOTE: This sheet is a summary. It may not cover all possible information. If you have questions about this medicine, talk to your doctor, pharmacist, or health care provider.  2018 Elsevier/Gold Standard (2015-05-29 11:28:51) Cyclophosphamide injection What is this medicine? CYCLOPHOSPHAMIDE (sye kloe FOSS fa mide) is a chemotherapy drug. It slows the growth of cancer cells. This medicine is used to treat many types of cancer like lymphoma, myeloma,  leukemia, breast cancer, and ovarian cancer, to name a few. This medicine may be used for other purposes; ask your health care provider or pharmacist if you have questions. COMMON BRAND NAME(S): Cytoxan, Neosar What should I tell my health care provider before I take this medicine? They need to know if you have any of these conditions: -blood disorders -history of other chemotherapy -infection -kidney disease -liver disease -recent or ongoing radiation therapy -tumors in the bone marrow -an unusual or  allergic reaction to cyclophosphamide, other chemotherapy, other medicines, foods, dyes, or preservatives -pregnant or trying to get pregnant -breast-feeding How should I use this medicine? This drug is usually given as an injection into a vein or muscle or by infusion into a vein. It is administered in a hospital or clinic by a specially trained health care professional. Talk to your pediatrician regarding the use of this medicine in children. Special care may be needed. Overdosage: If you think you have taken too much of this medicine contact a poison control center or emergency room at once. NOTE: This medicine is only for you. Do not share this medicine with others. What if I miss a dose? It is important not to miss your dose. Call your doctor or health care professional if you are unable to keep an appointment. What may interact with this medicine? This medicine may interact with the following medications: -amiodarone -amphotericin B -azathioprine -certain antiviral medicines for HIV or AIDS such as protease inhibitors (e.g., indinavir, ritonavir) and zidovudine -certain blood pressure medications such as benazepril, captopril, enalapril, fosinopril, lisinopril, moexipril, monopril, perindopril, quinapril, ramipril, trandolapril -certain cancer medications such as anthracyclines (e.g., daunorubicin, doxorubicin), busulfan, cytarabine, paclitaxel, pentostatin, tamoxifen, trastuzumab -certain diuretics such as chlorothiazide, chlorthalidone, hydrochlorothiazide, indapamide, metolazone -certain medicines that treat or prevent blood clots like warfarin -certain muscle relaxants such as succinylcholine -cyclosporine -etanercept -indomethacin -medicines to increase blood counts like filgrastim, pegfilgrastim, sargramostim -medicines used as general anesthesia -metronidazole -natalizumab This list may not describe all possible interactions. Give your health care provider a list of all the  medicines, herbs, non-prescription drugs, or dietary supplements you use. Also tell them if you smoke, drink alcohol, or use illegal drugs. Some items may interact with your medicine. What should I watch for while using this medicine? Visit your doctor for checks on your progress. This drug may make you feel generally unwell. This is not uncommon, as chemotherapy can affect healthy cells as well as cancer cells. Report any side effects. Continue your course of treatment even though you feel ill unless your doctor tells you to stop. Drink water or other fluids as directed. Urinate often, even at night. In some cases, you may be given additional medicines to help with side effects. Follow all directions for their use. Call your doctor or health care professional for advice if you get a fever, chills or sore throat, or other symptoms of a cold or flu. Do not treat yourself. This drug decreases your body's ability to fight infections. Try to avoid being around people who are sick. This medicine may increase your risk to bruise or bleed. Call your doctor or health care professional if you notice any unusual bleeding. Be careful brushing and flossing your teeth or using a toothpick because you may get an infection or bleed more easily. If you have any dental work done, tell your dentist you are receiving this medicine. You may get drowsy or dizzy. Do not drive, use machinery, or do anything that needs mental alertness until  you know how this medicine affects you. Do not become pregnant while taking this medicine or for 1 year after stopping it. Women should inform their doctor if they wish to become pregnant or think they might be pregnant. Men should not father a child while taking this medicine and for 4 months after stopping it. There is a potential for serious side effects to an unborn child. Talk to your health care professional or pharmacist for more information. Do not breast-feed an infant while taking  this medicine. This medicine may interfere with the ability to have a child. This medicine has caused ovarian failure in some women. This medicine has caused reduced sperm counts in some men. You should talk with your doctor or health care professional if you are concerned about your fertility. If you are going to have surgery, tell your doctor or health care professional that you have taken this medicine. What side effects may I notice from receiving this medicine? Side effects that you should report to your doctor or health care professional as soon as possible: -allergic reactions like skin rash, itching or hives, swelling of the face, lips, or tongue -low blood counts - this medicine may decrease the number of white blood cells, red blood cells and platelets. You may be at increased risk for infections and bleeding. -signs of infection - fever or chills, cough, sore throat, pain or difficulty passing urine -signs of decreased platelets or bleeding - bruising, pinpoint red spots on the skin, black, tarry stools, blood in the urine -signs of decreased red blood cells - unusually weak or tired, fainting spells, lightheadedness -breathing problems -dark urine -dizziness -palpitations -swelling of the ankles, feet, hands -trouble passing urine or change in the amount of urine -weight gain -yellowing of the eyes or skin Side effects that usually do not require medical attention (report to your doctor or health care professional if they continue or are bothersome): -changes in nail or skin color -hair loss -missed menstrual periods -mouth sores -nausea, vomiting This list may not describe all possible side effects. Call your doctor for medical advice about side effects. You may report side effects to FDA at 1-800-FDA-1088. Where should I keep my medicine? This drug is given in a hospital or clinic and will not be stored at home. NOTE: This sheet is a summary. It may not cover all possible  information. If you have questions about this medicine, talk to your doctor, pharmacist, or health care provider.  2018 Elsevier/Gold Standard (2012-02-14 16:22:58) Pegfilgrastim injection What is this medicine? PEGFILGRASTIM (PEG fil gra stim) is a long-acting granulocyte colony-stimulating factor that stimulates the growth of neutrophils, a type of white blood cell important in the body's fight against infection. It is used to reduce the incidence of fever and infection in patients with certain types of cancer who are receiving chemotherapy that affects the bone marrow, and to increase survival after being exposed to high doses of radiation. This medicine may be used for other purposes; ask your health care provider or pharmacist if you have questions. COMMON BRAND NAME(S): Neulasta What should I tell my health care provider before I take this medicine? They need to know if you have any of these conditions: -kidney disease -latex allergy -ongoing radiation therapy -sickle cell disease -skin reactions to acrylic adhesives (On-Body Injector only) -an unusual or allergic reaction to pegfilgrastim, filgrastim, other medicines, foods, dyes, or preservatives -pregnant or trying to get pregnant -breast-feeding How should I use this medicine? This medicine is  for injection under the skin. If you get this medicine at home, you will be taught how to prepare and give the pre-filled syringe or how to use the On-body Injector. Refer to the patient Instructions for Use for detailed instructions. Use exactly as directed. Tell your healthcare provider immediately if you suspect that the On-body Injector may not have performed as intended or if you suspect the use of the On-body Injector resulted in a missed or partial dose. It is important that you put your used needles and syringes in a special sharps container. Do not put them in a trash can. If you do not have a sharps container, call your pharmacist or  healthcare provider to get one. Talk to your pediatrician regarding the use of this medicine in children. While this drug may be prescribed for selected conditions, precautions do apply. Overdosage: If you think you have taken too much of this medicine contact a poison control center or emergency room at once. NOTE: This medicine is only for you. Do not share this medicine with others. What if I miss a dose? It is important not to miss your dose. Call your doctor or health care professional if you miss your dose. If you miss a dose due to an On-body Injector failure or leakage, a new dose should be administered as soon as possible using a single prefilled syringe for manual use. What may interact with this medicine? Interactions have not been studied. Give your health care provider a list of all the medicines, herbs, non-prescription drugs, or dietary supplements you use. Also tell them if you smoke, drink alcohol, or use illegal drugs. Some items may interact with your medicine. This list may not describe all possible interactions. Give your health care provider a list of all the medicines, herbs, non-prescription drugs, or dietary supplements you use. Also tell them if you smoke, drink alcohol, or use illegal drugs. Some items may interact with your medicine. What should I watch for while using this medicine? You may need blood work done while you are taking this medicine. If you are going to need a MRI, CT scan, or other procedure, tell your doctor that you are using this medicine (On-Body Injector only). What side effects may I notice from receiving this medicine? Side effects that you should report to your doctor or health care professional as soon as possible: -allergic reactions like skin rash, itching or hives, swelling of the face, lips, or tongue -dizziness -fever -pain, redness, or irritation at site where injected -pinpoint red spots on the skin -red or dark-brown urine -shortness of  breath or breathing problems -stomach or side pain, or pain at the shoulder -swelling -tiredness -trouble passing urine or change in the amount of urine Side effects that usually do not require medical attention (report to your doctor or health care professional if they continue or are bothersome): -bone pain -muscle pain This list may not describe all possible side effects. Call your doctor for medical advice about side effects. You may report side effects to FDA at 1-800-FDA-1088. Where should I keep my medicine? Keep out of the reach of children. Store pre-filled syringes in a refrigerator between 2 and 8 degrees C (36 and 46 degrees F). Do not freeze. Keep in carton to protect from light. Throw away this medicine if it is left out of the refrigerator for more than 48 hours. Throw away any unused medicine after the expiration date. NOTE: This sheet is a summary. It may not cover  all possible information. If you have questions about this medicine, talk to your doctor, pharmacist, or health care provider.  2018 [employer]vier/Gold Standard (2016-03-28 12:58:03)

## 2017-01-01 NOTE — Progress Notes (Signed)
Patient Care Team: [name], MD as PCP - General (Family Medicine) [name], MD as Consulting Physician (General Surgery) Nicholas Lose, MD as Consulting Physician (Hematology and Oncology)  DIAGNOSIS:  Encounter Diagnosis  Name Primary?  . Malignant neoplasm of upper-inner quadrant of left breast in female, estrogen receptor negative ([place])     SUMMARY OF ONCOLOGIC HISTORY:   Malignant neoplasm of upper-inner quadrant of left breast in female, estrogen receptor negative ([place])   09/02/2016 Initial Diagnosis    Bilateral breast cancers: Left breast 11:30 position 6.3 cm with 3 abnormal lymph nodes biopsy of both IDC grade 2 LVI, triple negative, Ki-67 90% T3 N1 stage IIIB; right breast multiple masses 1.4 cm and 1:00, 5 mm 12:30 axilla negative biopsy ILC grade 2, ER 90%, PR 30%, Ki-67 10%, HER-2 negative ratio 1.74, T1 cN0 stage IA      09/25/2016 -  Neo-Adjuvant Chemotherapy    Taxol weekly x 12      10/03/2016 Genetic Testing    Underwent genetic counseling 10/03/2016. Declined genetic testing.       Malignant neoplasm of upper-inner quadrant of right breast in female, estrogen receptor positive ([place])   09/05/2016 Initial Diagnosis    Malignant neoplasm of upper-inner quadrant of right breast in female, estrogen receptor positive ([place])     10/03/2016 Genetic Testing    Underwent genetic counseling 10/03/2016. Declined genetic testing.       CHIEF COMPLIANT: Cycle 1 Adriamycin and Cytoxan  INTERVAL HISTORY: Laurie Collins is a 76 year old with above-mentioned history of right breast cancer currently on neoadjuvant chemotherapy and today is cycle 1 of dose dense Adriamycin Cytoxan. Overall her energy levels have been low. She feels short of breath to minimal exertion. She denies any chest pain. Had recent echocardiogram was normal. She is anxious about starting this chemotherapy.  REVIEW OF SYSTEMS:   Constitutional: Denies fevers, chills or abnormal weight  loss Eyes: Denies blurriness of vision Ears, nose, mouth, throat, and face: Denies mucositis or sore throat Respiratory: Shortness of breath to minimal exertion Cardiovascular: Denies palpitation, chest discomfort Gastrointestinal:  Denies nausea, heartburn or change in bowel habits Skin: Denies abnormal skin rashes Lymphatics: Denies new lymphadenopathy or easy bruising Neurological:Denies numbness, tingling or new weaknesses Behavioral/Psych: Mood is stable, no new changes  Extremities: No lower extremity edema Breast:  denies any pain or lumps or nodules in either breasts All other systems were reviewed with the patient and are negative.  I have reviewed the past medical history, past surgical history, social history and family history with the patient and they are unchanged from previous note.  ALLERGIES:  is allergic to doxycycline and penicillins.  MEDICATIONS:  Current Outpatient Prescriptions  Medication Sig Dispense Refill  . amLODipine (NORVASC) 10 MG tablet Take 10 mg by mouth daily.    Marland Kitchen aspirin 81 MG chewable tablet Chew 81 mg by mouth daily.    . Cholecalciferol (VITAMIN D) 2000 units tablet Take 2,000 Units by mouth daily.    . clonazePAM (KLONOPIN) 0.5 MG tablet Take 0.5 mg by mouth at bedtime as needed for sleep or anxiety.    Marland Kitchen dexamethasone (DECADRON) 4 MG tablet Take 2 tablets (8 mg total) by mouth daily. 60 tablet 1  . glimepiride (AMARYL) 1 MG tablet Take 1 mg by mouth daily with breakfast.    . hydrochlorothiazide (MICROZIDE) 12.5 MG capsule Take 12.5 mg by mouth daily.    Marland Kitchen lisinopril (PRINIVIL,ZESTRIL) 20 MG tablet Take 20 mg by mouth daily.    Marland Kitchen  loperamide (IMODIUM) 2 MG capsule Take 2 mg by mouth every 4 (four) hours as needed for diarrhea or loose stools.     . LORazepam (ATIVAN) 1 MG tablet Take 1 tablet (1 mg total) by mouth at bedtime. (Patient not taking: Reported on 12/25/2016) 30 tablet 0  . metFORMIN (GLUCOPHAGE) 1000 MG tablet Take 1,000 mg by mouth 2  (two) times daily with a meal.    . metoprolol succinate (TOPROL-XL) 100 MG 24 hr tablet Take 100 mg by mouth daily.    . oxyCODONE (OXY IR/ROXICODONE) 5 MG immediate release tablet Take 1-2 tablets (5-10 mg total) by mouth every 6 (six) hours as needed for moderate pain, severe pain or breakthrough pain. 15 tablet 0  . simvastatin (ZOCOR) 10 MG tablet Take 10 mg by mouth at bedtime.    . Triamcinolone Acetonide (TRIAMCINOLONE 0.1 % CREAM : EUCERIN) CREA Apply 1 application topically 2 (two) times daily as needed. 1 each 1   No current facility-administered medications for this visit.     PHYSICAL EXAMINATION: ECOG PERFORMANCE STATUS: 1 - Symptomatic but completely ambulatory  Vitals:   01/01/17 1036  BP: (!) 132/50  Pulse: 90  Resp: 19  Temp: 97.6 F (36.4 C)  SpO2: 100%   Filed Weights   01/01/17 1036  Weight: 190 lb 11.2 oz (86.5 kg)    GENERAL:alert, no distress and comfortable SKIN: skin color, texture, turgor are normal, no rashes or significant lesions EYES: normal, Conjunctiva are pink and non-injected, sclera clear OROPHARYNX:no exudate, no erythema and lips, buccal mucosa, and tongue normal  NECK: supple, thyroid normal size, non-tender, without nodularity LYMPH:  no palpable lymphadenopathy in the cervical, axillary or inguinal LUNGS: clear to auscultation and percussion with normal breathing effort HEART: regular rate & rhythm and no murmurs and no lower extremity edema ABDOMEN:abdomen soft, non-tender and normal bowel sounds MUSCULOSKELETAL:no cyanosis of digits and no clubbing  NEURO: alert & oriented x 3 with fluent speech, no focal motor/sensory deficits EXTREMITIES: No lower extremity edema  LABORATORY DATA:  I have reviewed the data as listed   Chemistry      Component Value Date/Time   NA 139 01/01/2017 1007   K 4.0 01/01/2017 1007   CL 100 (L) 09/19/2016 1130   CO2 26 01/01/2017 1007   BUN 18.7 01/01/2017 1007   CREATININE 1.0 01/01/2017 1007        Component Value Date/Time   CALCIUM 9.9 01/01/2017 1007   ALKPHOS 71 01/01/2017 1007   AST 53 (H) 01/01/2017 1007   ALT 50 01/01/2017 1007   BILITOT 0.45 01/01/2017 1007       Lab Results  Component Value Date   WBC 13.8 (H) 01/01/2017   HGB 10.6 (L) 01/01/2017   HCT 33.7 (L) 01/01/2017   MCV 84.6 01/01/2017   PLT 292 01/01/2017   NEUTROABS 9.7 (H) 01/01/2017    ASSESSMENT & PLAN:  Malignant neoplasm of upper-inner quadrant of left breast in female, estrogen receptor negative ([facility]) Bilateral breast cancers:  Left breast11:30 position 6.3 cm with 3 abnormal lymph nodes biopsy of both IDC grade 2 LVI, triple negative, Ki-67 90% T3 N1 stage IIIB; Right breastmultiple masses 1.4 cm and 1:00, 5 mm 12:30 axilla negative biopsy ILC grade 2, ER 90%, PR 30%, Ki-67 10%, HER-2 negative ratio 1.74, T1 cN0 stage IA  Treatment plan: 1. Neoadjuvant chemotherapy: Weekly Taxol 12 and Adriamycin Cytoxan every 2 weeks 4 2. Followed by surgery 3. Followed by radiation 4. Followed by antiestrogen   therapy because of right-sided breast cancer is ER positive ------------------------------------------------------------------- Current treatment: Adriamycin/Cytoxan cycle 1 day 1 Echocardiogram 12/30/2016: EF 65-70% Labs have been reviewed Education provided I will see the patient next week to review toxicities   I spent 25 minutes talking to the patient of which more than half was spent in counseling and coordination of care.  No orders of the defined types were placed in this encounter.  The patient has a good understanding of the overall plan. she agrees with it. she will call with any problems that may develop before the next visit here.   Gudena, Vinay K, MD 01/01/17    

## 2017-01-01 NOTE — Progress Notes (Signed)
Pt wasn't aware to take her nausea medication prior to treatment today for [facility]. Dr. [name] is aware and is ok to treat.  Cyndia Bent RN

## 2017-01-03 ENCOUNTER — Encounter (HOSPITAL_COMMUNITY): Payer: Self-pay | Admitting: Emergency Medicine

## 2017-01-03 ENCOUNTER — Observation Stay (HOSPITAL_COMMUNITY)
Admission: EM | Admit: 2017-01-03 | Discharge: 2017-01-05 | Disposition: A | Discharge status: Home / Self Care | Payer: Medicare HMO | Attending: Internal Medicine | Admitting: Internal Medicine

## 2017-01-03 ENCOUNTER — Other Ambulatory Visit: Payer: Self-pay

## 2017-01-03 ENCOUNTER — Emergency Department (HOSPITAL_COMMUNITY): Payer: Medicare HMO

## 2017-01-03 DIAGNOSIS — C50212 Malignant neoplasm of upper-inner quadrant of left female breast: Secondary | ICD-10-CM | POA: Diagnosis not present

## 2017-01-03 DIAGNOSIS — Z7982 Long term (current) use of aspirin: Secondary | ICD-10-CM | POA: Diagnosis not present

## 2017-01-03 DIAGNOSIS — Z87891 Personal history of nicotine dependence: Secondary | ICD-10-CM | POA: Insufficient documentation

## 2017-01-03 DIAGNOSIS — R069 Unspecified abnormalities of breathing: Secondary | ICD-10-CM | POA: Diagnosis not present

## 2017-01-03 DIAGNOSIS — Z7984 Long term (current) use of oral hypoglycemic drugs: Secondary | ICD-10-CM | POA: Diagnosis not present

## 2017-01-03 DIAGNOSIS — Z880 Allergy status to penicillin: Secondary | ICD-10-CM | POA: Insufficient documentation

## 2017-01-03 DIAGNOSIS — C50211 Malignant neoplasm of upper-inner quadrant of right female breast: Secondary | ICD-10-CM | POA: Diagnosis not present

## 2017-01-03 DIAGNOSIS — I10 Essential (primary) hypertension: Secondary | ICD-10-CM | POA: Diagnosis not present

## 2017-01-03 DIAGNOSIS — R0602 Shortness of breath: Secondary | ICD-10-CM | POA: Diagnosis not present

## 2017-01-03 DIAGNOSIS — J441 Chronic obstructive pulmonary disease with (acute) exacerbation: Principal | ICD-10-CM | POA: Diagnosis present

## 2017-01-03 DIAGNOSIS — E119 Type 2 diabetes mellitus without complications: Secondary | ICD-10-CM | POA: Diagnosis not present

## 2017-01-03 DIAGNOSIS — R9431 Abnormal electrocardiogram [ECG] [EKG]: Secondary | ICD-10-CM | POA: Diagnosis not present

## 2017-01-03 DIAGNOSIS — R062 Wheezing: Secondary | ICD-10-CM | POA: Diagnosis not present

## 2017-01-03 DIAGNOSIS — Z8611 Personal history of tuberculosis: Secondary | ICD-10-CM | POA: Insufficient documentation

## 2017-01-03 DIAGNOSIS — Z171 Estrogen receptor negative status [ER-]: Secondary | ICD-10-CM | POA: Diagnosis not present

## 2017-01-03 DIAGNOSIS — Z9221 Personal history of antineoplastic chemotherapy: Secondary | ICD-10-CM | POA: Insufficient documentation

## 2017-01-03 DIAGNOSIS — D72829 Elevated white blood cell count, unspecified: Secondary | ICD-10-CM

## 2017-01-03 LAB — CBC WITH DIFFERENTIAL/PLATELET
BASOS ABS: 0 10*3/uL (ref 0.0–0.1)
BASOS PCT: 0 %
EOS ABS: 0 10*3/uL (ref 0.0–0.7)
Eosinophils Relative: 0 %
HEMATOCRIT: 31.3 % — AB (ref 36.0–46.0)
HEMOGLOBIN: 9.8 g/dL — AB (ref 12.0–15.0)
LYMPHS PCT: 4 %
Lymphs Abs: 2 10*3/uL (ref 0.7–4.0)
MCH: 26.7 pg (ref 26.0–34.0)
MCHC: 31.3 g/dL (ref 30.0–36.0)
MCV: 85.3 fL (ref 78.0–100.0)
MONOS PCT: 3 %
Monocytes Absolute: 1.5 10*3/uL — ABNORMAL HIGH (ref 0.1–1.0)
NEUTROS PCT: 93 %
Neutro Abs: 46.1 10*3/uL — ABNORMAL HIGH (ref 1.7–7.7)
Platelets: 349 10*3/uL (ref 150–400)
RBC: 3.67 MIL/uL — ABNORMAL LOW (ref 3.87–5.11)
RDW: 22.8 % — ABNORMAL HIGH (ref 11.5–15.5)
WBC: 49.6 10*3/uL — ABNORMAL HIGH (ref 4.0–10.5)

## 2017-01-03 LAB — GLUCOSE, CAPILLARY
GLUCOSE-CAPILLARY: 401 mg/dL — AB (ref 65–99)
GLUCOSE-CAPILLARY: 375 mg/dL — AB (ref 65–99)
GLUCOSE-CAPILLARY: 326 mg/dL — AB (ref 65–99)

## 2017-01-03 LAB — BRAIN NATRIURETIC PEPTIDE: B Natriuretic Peptide: 366 pg/mL — ABNORMAL HIGH (ref 0.0–100.0)

## 2017-01-03 LAB — COMPREHENSIVE METABOLIC PANEL
ALK PHOS: 71 U/L (ref 38–126)
ALT: 37 U/L (ref 14–54)
AST: 30 U/L (ref 15–41)
Albumin: 3.8 g/dL (ref 3.5–5.0)
Anion gap: 17 — ABNORMAL HIGH (ref 5–15)
BILIRUBIN TOTAL: 0.8 mg/dL (ref 0.3–1.2)
BUN: 43 mg/dL — AB (ref 6–20)
CALCIUM: 9.3 mg/dL (ref 8.9–10.3)
CO2: 18 mmol/L — ABNORMAL LOW (ref 22–32)
Chloride: 100 mmol/L — ABNORMAL LOW (ref 101–111)
Creatinine, Ser: 1.11 mg/dL — ABNORMAL HIGH (ref 0.44–1.00)
GFR calc Af Amer: 54 mL/min — ABNORMAL LOW (ref >60)
GFR, EST NON AFRICAN AMERICAN: 47 mL/min — AB (ref >60)
Glucose, Bld: 404 mg/dL — ABNORMAL HIGH (ref 65–99)
POTASSIUM: 3.6 mmol/L (ref 3.5–5.1)
Sodium: 135 mmol/L (ref 135–145)
TOTAL PROTEIN: 7.6 g/dL (ref 6.5–8.1)

## 2017-01-03 LAB — I-STAT CHEM 8, ED
BUN: 41 mg/dL — AB (ref 6–20)
CHLORIDE: 101 mmol/L (ref 101–111)
Calcium, Ion: 1.22 mmol/L (ref 1.15–1.40)
Creatinine, Ser: 0.9 mg/dL (ref 0.44–1.00)
Glucose, Bld: 499 mg/dL — ABNORMAL HIGH (ref 65–99)
HEMATOCRIT: 33 % — AB (ref 36.0–46.0)
Hemoglobin: 11.2 g/dL — ABNORMAL LOW (ref 12.0–15.0)
Potassium: 4.4 mmol/L (ref 3.5–5.1)
SODIUM: 138 mmol/L (ref 135–145)
TCO2: 24 mmol/L (ref 22–32)

## 2017-01-03 LAB — I-STAT TROPONIN, ED: TROPONIN I, POC: 0.06 ng/mL (ref 0.00–0.08)

## 2017-01-03 LAB — HEMOGLOBIN A1C
HEMOGLOBIN A1C: 7 % — AB (ref 4.8–5.6)
Mean Plasma Glucose: 154.2 mg/dL

## 2017-01-03 LAB — CBG MONITORING, ED: Glucose-Capillary: 389 mg/dL — ABNORMAL HIGH (ref 65–99)

## 2017-01-03 LAB — INFLUENZA PANEL BY PCR (TYPE A & B)
Influenza A By PCR: NEGATIVE
Influenza B By PCR: NEGATIVE

## 2017-01-03 MED ORDER — MAGNESIUM SULFATE 2 GM/50ML IV SOLN
2.0000 g | Freq: Once | INTRAVENOUS | Status: AC
  Administered 2017-01-03: 2 g via INTRAVENOUS
  Filled 2017-01-03: qty 50

## 2017-01-03 MED ORDER — LISINOPRIL 20 MG PO TABS
20.0000 mg | ORAL_TABLET | Freq: Every day | ORAL | Status: DC
  Administered 2017-01-03 – 2017-01-05 (×3): 20 mg via ORAL
  Filled 2017-01-03 (×3): qty 1

## 2017-01-03 MED ORDER — SODIUM CHLORIDE 0.9% FLUSH
10.0000 mL | INTRAVENOUS | Status: DC | PRN
  Administered 2017-01-05: 10 mL
  Filled 2017-01-03: qty 40

## 2017-01-03 MED ORDER — INSULIN ASPART 100 UNIT/ML ~~LOC~~ SOLN
0.0000 [IU] | Freq: Three times a day (TID) | SUBCUTANEOUS | Status: DC
  Administered 2017-01-03 (×2): 9 [IU] via SUBCUTANEOUS
  Filled 2017-01-03: qty 1

## 2017-01-03 MED ORDER — FUROSEMIDE 10 MG/ML IJ SOLN
40.0000 mg | Freq: Once | INTRAMUSCULAR | Status: AC
  Administered 2017-01-03: 40 mg via INTRAVENOUS
  Filled 2017-01-03: qty 4

## 2017-01-03 MED ORDER — IPRATROPIUM-ALBUTEROL 0.5-2.5 (3) MG/3ML IN SOLN
RESPIRATORY_TRACT | Status: AC
  Administered 2017-01-03: 10:00:00
  Filled 2017-01-03: qty 3

## 2017-01-03 MED ORDER — INSULIN GLARGINE 100 UNIT/ML ~~LOC~~ SOLN
10.0000 [IU] | Freq: Once | SUBCUTANEOUS | Status: AC
  Administered 2017-01-03: 10 [IU] via SUBCUTANEOUS
  Filled 2017-01-03 (×2): qty 0.1

## 2017-01-03 MED ORDER — LEVALBUTEROL HCL 1.25 MG/0.5ML IN NEBU
1.2500 mg | INHALATION_SOLUTION | Freq: Three times a day (TID) | RESPIRATORY_TRACT | Status: DC | PRN
  Administered 2017-01-03 (×2): 1.25 mg via RESPIRATORY_TRACT
  Filled 2017-01-03 (×2): qty 0.5

## 2017-01-03 MED ORDER — ALBUTEROL SULFATE (2.5 MG/3ML) 0.083% IN NEBU
10.0000 mg | INHALATION_SOLUTION | Freq: Once | RESPIRATORY_TRACT | Status: AC
  Administered 2017-01-03: 10 mg via RESPIRATORY_TRACT
  Filled 2017-01-03: qty 12

## 2017-01-03 MED ORDER — IOPAMIDOL (ISOVUE-370) INJECTION 76%
100.0000 mL | Freq: Once | INTRAVENOUS | Status: AC | PRN
  Administered 2017-01-03: 100 mL via INTRAVENOUS

## 2017-01-03 MED ORDER — INSULIN GLARGINE 100 UNIT/ML ~~LOC~~ SOLN
30.0000 [IU] | Freq: Every day | SUBCUTANEOUS | Status: DC
  Administered 2017-01-03 – 2017-01-05 (×3): 30 [IU] via SUBCUTANEOUS
  Filled 2017-01-03 (×3): qty 0.3

## 2017-01-03 MED ORDER — INSULIN GLARGINE 100 UNIT/ML ~~LOC~~ SOLN: 25.0000 [IU] | Freq: Every day | SUBCUTANEOUS | Status: DC

## 2017-01-03 MED ORDER — ASPIRIN 81 MG PO CHEW
81.0000 mg | CHEWABLE_TABLET | Freq: Every day | ORAL | Status: DC
  Administered 2017-01-03 – 2017-01-05 (×3): 81 mg via ORAL
  Filled 2017-01-03 (×3): qty 1

## 2017-01-03 MED ORDER — METHYLPREDNISOLONE SODIUM SUCC 125 MG IJ SOLR
60.0000 mg | Freq: Two times a day (BID) | INTRAMUSCULAR | Status: DC
  Administered 2017-01-03 – 2017-01-04 (×3): 60 mg via INTRAVENOUS
  Filled 2017-01-03 (×3): qty 2

## 2017-01-03 MED ORDER — IPRATROPIUM BROMIDE 0.02 % IN SOLN
0.5000 mg | Freq: Once | RESPIRATORY_TRACT | Status: AC
  Administered 2017-01-03: 0.5 mg via RESPIRATORY_TRACT
  Filled 2017-01-03: qty 2.5

## 2017-01-03 MED ORDER — OXYCODONE HCL 5 MG PO TABS
5.0000 mg | ORAL_TABLET | Freq: Four times a day (QID) | ORAL | Status: DC | PRN
  Administered 2017-01-04 (×2): 10 mg via ORAL
  Filled 2017-01-03 (×2): qty 2

## 2017-01-03 MED ORDER — MOMETASONE FURO-FORMOTEROL FUM 200-5 MCG/ACT IN AERO
2.0000 | INHALATION_SPRAY | Freq: Two times a day (BID) | RESPIRATORY_TRACT | Status: DC
  Filled 2017-01-03: qty 8.8

## 2017-01-03 MED ORDER — INSULIN ASPART 100 UNIT/ML ~~LOC~~ SOLN
0.0000 [IU] | Freq: Four times a day (QID) | SUBCUTANEOUS | Status: DC
  Administered 2017-01-03: 7 [IU] via SUBCUTANEOUS
  Administered 2017-01-03: 9 [IU] via SUBCUTANEOUS
  Administered 2017-01-04 (×4): 5 [IU] via SUBCUTANEOUS
  Administered 2017-01-05: 2 [IU] via SUBCUTANEOUS
  Administered 2017-01-05: 3 [IU] via SUBCUTANEOUS
  Filled 2017-01-03: qty 1

## 2017-01-03 MED ORDER — LEVOFLOXACIN IN D5W 750 MG/150ML IV SOLN
750.0000 mg | INTRAVENOUS | Status: DC
  Administered 2017-01-04: 750 mg via INTRAVENOUS
  Filled 2017-01-03 (×2): qty 150

## 2017-01-03 MED ORDER — ALBUTEROL SULFATE (2.5 MG/3ML) 0.083% IN NEBU
5.0000 mg | INHALATION_SOLUTION | Freq: Once | RESPIRATORY_TRACT | Status: AC
  Administered 2017-01-03: 5 mg via RESPIRATORY_TRACT
  Filled 2017-01-03: qty 6

## 2017-01-03 MED ORDER — LEVOFLOXACIN IN D5W 750 MG/150ML IV SOLN
750.0000 mg | Freq: Once | INTRAVENOUS | Status: AC
  Administered 2017-01-03: 750 mg via INTRAVENOUS
  Filled 2017-01-03: qty 150

## 2017-01-03 MED ORDER — SIMVASTATIN 10 MG PO TABS
10.0000 mg | ORAL_TABLET | Freq: Every day | ORAL | Status: DC
  Administered 2017-01-03 – 2017-01-04 (×2): 10 mg via ORAL
  Filled 2017-01-03 (×2): qty 1

## 2017-01-03 MED ORDER — DEXAMETHASONE 4 MG PO TABS
8.0000 mg | ORAL_TABLET | Freq: Every day | ORAL | Status: DC
  Administered 2017-01-03: 8 mg via ORAL
  Filled 2017-01-03: qty 2

## 2017-01-03 MED ORDER — METOPROLOL SUCCINATE ER 100 MG PO TB24
100.0000 mg | ORAL_TABLET | Freq: Every day | ORAL | Status: DC
  Administered 2017-01-03 – 2017-01-05 (×3): 100 mg via ORAL
  Filled 2017-01-03 (×3): qty 1

## 2017-01-03 MED ORDER — IOPAMIDOL (ISOVUE-370) INJECTION 76%
INTRAVENOUS | Status: AC
  Administered 2017-01-03: 100 mL via INTRAVENOUS
  Filled 2017-01-03: qty 100

## 2017-01-03 MED ORDER — HYDROCHLOROTHIAZIDE 12.5 MG PO CAPS
12.5000 mg | ORAL_CAPSULE | Freq: Every day | ORAL | Status: DC
  Administered 2017-01-03: 12.5 mg via ORAL
  Filled 2017-01-03: qty 1

## 2017-01-03 MED ORDER — METHYLPREDNISOLONE SODIUM SUCC 40 MG IJ SOLR: 40.0000 mg | Freq: Two times a day (BID) | INTRAMUSCULAR | Status: DC

## 2017-01-03 NOTE — ED Notes (Signed)
Pt waiting on CT angio

## 2017-01-03 NOTE — ED Notes (Signed)
Bed: WA25 Expected date:  Expected time:  Means of arrival:  Comments: EMS  

## 2017-01-03 NOTE — ED Triage Notes (Signed)
Pt brought in from home by EMS for c/o shortness of breath   EMS states they were called out earlier tonight for same and pt had wheezing throughout  They gave an albuterol 5mg  nebulized treatment and it cleared up and the pt stated she felt better and refused transport  EMS states they were called back out about 30 minutes later for same  Pt was waiting for them in the driveway this time pt had no complaints and her lungs were clear but she had decided to come in and get checked out  Pt has breast cancer and is undergoing chemotherapy  Last treatment was on Wednesday

## 2017-01-03 NOTE — ED Notes (Signed)
Took pt to bathroom in a wheelchair and back to bed via wheelchair  Pt became short of breath with audible wheezes noted  Paged respiratory and spoke with EDP  Orders received

## 2017-01-03 NOTE — Progress Notes (Signed)
CSW received consult for COPD Gold Protocol, though patient does not meet qualifications (only 1 admission within the past 6 months).  CSW signing off.   Coverage for [phone]

## 2017-01-03 NOTE — H&P (Signed)
[facility] History and Physical  Laurie Collins:[id number] DOB: [birth year] DOA: 01/03/2017  Referring physician:  PCP: [name], MD   Chief Complaint: Wheezing and shortness of breath  HPI:  76 year old female with a history of breast cancer,currently on neoadjuvant chemotherapy and today is cycle 1 of dose dense Adriamycin Cytoxan, last chemotherapy 9/19, hypertension, diabetes, COPD currently a nonsmoker who presented to the ER because of shortness of breath, wheezing. Patient started on Decadron 2 days ago after her chemotherapy. She was brought in by EMS who provided her with albuterol nebulizer treatment. Patient found to be actively wheezing upon presentation. No hypoxia was noted. Patient denied any subjective fever, chills, rigors. Denies any orthopnea, paroxysmal nocturnal dyspnea ED course BP 119/65 (BP Location: Right Arm)   Pulse 94   Temp 98.3 F (36.8 C) (Oral)   Resp 18   Ht 5\' 3"  (1.6 m)   Wt 86.2 kg (190 lb)   SpO2 95%   BMI 33.66 kg/m  Creatinine 1.11, initial blood glucose 499, hemoglobin 11.2, BNP 366, troponin 0.06, white blood cell count 49.6 CT  PE protocol did not show any evidence of PE, large mass in the left breast, extensive metastatic lymphadenopathy throughout the mediastinal. Patient treated with levofloxacin, IV Solu-Medrol, DuoNeb, magnesium sulfate Patient admitted for COPD exacerbation     Review of Systems: negative for the following  Constitutional: Denies fever, chills, diaphoresis, appetite change and fatigue.  HEENT: Denies photophobia, eye pain, redness, hearing loss, ear pain, congestion, sore throat, rhinorrhea, sneezing, mouth sores, trouble swallowing, neck pain, neck stiffness and tinnitus.  Respiratory: Positive for cough, shortness of breath and wheezing. Negative for hemoptysis and sputum production Cardiovascular: Denies chest pain, palpitations and leg swelling.  Gastrointestinal: Denies nausea, vomiting,  abdominal pain, diarrhea, constipation, blood in stool and abdominal distention.  Genitourinary: Denies dysuria, urgency, frequency, hematuria, flank pain and difficulty urinating.  Musculoskeletal: Denies myalgias, back pain, joint swelling, arthralgias and gait problem.  Skin: Denies pallor, rash and wound.  Neurological: Denies dizziness, seizures, syncope, weakness, light-headedness, numbness and headaches.  Hematological: Denies adenopathy. Easy bruising, personal or family bleeding history  Psychiatric/Behavioral: Denies suicidal ideation, mood changes, confusion, nervousness, sleep disturbance and agitation       Past Medical History:  Diagnosis Date  . Anxiety   . Cancer ([place])    breast  . COPD (chronic obstructive pulmonary disease) ([place])   . Depression   . Diabetes mellitus without complication ([place])   . Dyspnea   . Hypertension   . PONV (postoperative nausea and vomiting)    very,very,very sick from anesthesia  . Tuberculosis    1962     Past Surgical History:  Procedure Laterality Date  . BREAST SURGERY     5 breast lumpectomy  . PORTACATH PLACEMENT Right 09/24/2016   Procedure: INSERTION PORT-A-CATH;  Surgeon: [name], MD;  Location: [facility];  Service: General;  Laterality: Right;      Social History:  reports that she quit smoking about 5 years ago. Her smoking use included Cigarettes. She has a 50.00 pack-year smoking history. She has never used smokeless tobacco. She reports that she does not drink alcohol or use drugs.    Allergies  Allergen Reactions  . Doxycycline Anaphylaxis  . Penicillins Hives and Swelling    Has patient had a PCN reaction causing immediate rash, facial/tongue/throat swelling, SOB or lightheadedness with hypotension: Yes Has patient had a PCN reaction causing severe rash involving mucus membranes or skin necrosis: No Has  patient had a PCN reaction that required hospitalization: No Has patient had a PCN reaction occurring  within the last 10 years: No If all of the above answers are "NO", then may proceed with Cephalosporin use.     Family History  Problem Relation Age of Onset  . Stomach cancer Mother 5       d.67 from metastatic disease  . Cancer Maternal Aunt        Unspecified type  . Cancer Maternal Uncle        Unspecified type        Prior to Admission medications   Medication Sig Start Date End Date Taking? Authorizing Provider  amLODipine (NORVASC) 10 MG tablet Take 10 mg by mouth daily.   Yes [provider]  aspirin 81 MG chewable tablet Chew 81 mg by mouth daily.   Yes [provider]  Cholecalciferol (VITAMIN D) 2000 units tablet Take 2,000 Units by mouth daily.   Yes [provider]  dexamethasone (DECADRON) 4 MG tablet Take 2 tablets (8 mg total) by mouth daily. 12/25/16  Yes Causey, Charlestine Massed, NP  glimepiride (AMARYL) 1 MG tablet Take 1 mg by mouth daily with breakfast.   Yes [provider]  hydrochlorothiazide (MICROZIDE) 12.5 MG capsule Take 12.5 mg by mouth daily.   Yes [provider]  lisinopril (PRINIVIL,ZESTRIL) 20 MG tablet Take 20 mg by mouth daily.   Yes [provider]  loperamide (IMODIUM) 2 MG capsule Take 2 mg by mouth every 4 (four) hours as needed for diarrhea or loose stools.    Yes [provider]  metFORMIN (GLUCOPHAGE) 1000 MG tablet Take 1,000 mg by mouth 2 (two) times daily with a meal.   Yes [provider]  metoprolol succinate (TOPROL-XL) 100 MG 24 hr tablet Take 100 mg by mouth daily. 07/16/16  Yes [provider]  simvastatin (ZOCOR) 10 MG tablet Take 10 mg by mouth at bedtime. 07/16/16  Yes [provider]  LORazepam (ATIVAN) 1 MG tablet Take 1 tablet (1 mg total) by mouth at bedtime. Patient not taking: Reported on 12/25/2016 09/11/16   Nicholas Lose, MD  oxyCODONE (OXY IR/ROXICODONE) 5 MG immediate release tablet Take 1-2 tablets (5-10 mg total) by mouth every 6 (six)  hours as needed for moderate pain, severe pain or breakthrough pain. Patient not taking: Reported on 01/03/2017 09/24/16   [name], MD  Triamcinolone Acetonide (TRIAMCINOLONE 0.1 % CREAM : EUCERIN) CREA Apply 1 application topically 2 (two) times daily as needed. 12/25/16   Gardenia Phlegm, NP     Physical Exam: Vitals:   01/03/17 0227 01/03/17 0520 01/03/17 1030  BP: (!) 153/62 119/65 (!) 125/56  Pulse: (!) 103 94 (!) 105  Resp: 14 18 (!) 105  Temp: 98.3 F (36.8 C)    TempSrc: Oral    SpO2: 97% 95% 97%  Weight: 86.2 kg (190 lb)    Height: 5\' 3"  (1.6 m)          Vitals:   01/03/17 0227 01/03/17 0520 01/03/17 1030  BP: (!) 153/62 119/65 (!) 125/56  Pulse: (!) 103 94 (!) 105  Resp: 14 18 (!) 105  Temp: 98.3 F (36.8 C)    TempSrc: Oral    SpO2: 97% 95% 97%  Weight: 86.2 kg (190 lb)    Height: 5\' 3"  (1.6 m)     Constitutional: NAD, calm, comfortable Eyes: PERRL, lids and conjunctivae normal ENMT: Mucous membranes are moist. Posterior pharynx clear of  any exudate or lesions.Normal dentition.  Neck: normal, supple, no masses, no thyromegaly Pulmonary/Chest: No respiratory distress. She has wheezes.  Cardiovascular: Regular rate and rhythm, no murmurs / rubs / gallops. No extremity edema. 2+ pedal pulses. No carotid bruits.  Abdomen: no tenderness, no masses palpated. No hepatosplenomegaly. Bowel sounds positive.  Musculoskeletal: no clubbing / cyanosis. No joint deformity upper and lower extremities. Good ROM, no contractures. Normal muscle tone.  Skin: no rashes, lesions, ulcers. No induration Neurologic: CN 2-12 grossly intact. Sensation intact, DTR normal. Strength 5/5 in all 4.  Psychiatric: Normal judgment and insight. Alert and oriented x 3. Normal mood.     Labs on Admission: I have personally reviewed following labs and imaging studies  CBC:  Recent Labs Lab 01/01/17 1007 01/03/17 0343 01/03/17 0401  WBC 13.8* 49.6*  --   NEUTROABS 9.7* 46.1*   --   HGB 10.6* 9.8* 11.2*  HCT 33.7* 31.3* 33.0*  MCV 84.6 85.3  --   PLT 292 349  --     Basic Metabolic Panel:  Recent Labs Lab 01/01/17 1007 01/03/17 0401 01/03/17 0912  NA 139 138 135  K 4.0 4.4 3.6  CL  --  101 100*  CO2 26  --  18*  GLUCOSE 248* 499* 404*  BUN 18.7 41* 43*  CREATININE 1.0 0.90 1.11*  CALCIUM 9.9  --  9.3    GFR: Estimated Creatinine Clearance: 44.9 mL/min (A) (by C-G formula based on SCr of 1.11 mg/dL (H)).  Liver Function Tests:  Recent Labs Lab 01/01/17 1007 01/03/17 0912  AST 53* 30  ALT 50 37  ALKPHOS 71 71  BILITOT 0.45 0.8  PROT 7.8 7.6  ALBUMIN 3.4* 3.8   No results for input(s): LIPASE, AMYLASE in the last 168 hours. No results for input(s): AMMONIA in the last 168 hours.  Coagulation Profile: No results for input(s): INR, PROTIME in the last 168 hours. No results for input(s): DDIMER in the last 72 hours.  Cardiac Enzymes: No results for input(s): CKTOTAL, CKMB, CKMBINDEX, TROPONINI in the last 168 hours.  BNP (last 3 results) No results for input(s): PROBNP in the last 8760 hours.  HbA1C: No results for input(s): HGBA1C in the last 72 hours. Lab Results  Component Value Date   HGBA1C 6.7 (H) 09/19/2016     CBG:  Recent Labs Lab 01/03/17 1015  GLUCAP 389*    Lipid Profile: No results for input(s): CHOL, HDL, LDLCALC, TRIG, CHOLHDL, LDLDIRECT in the last 72 hours.  Thyroid Function Tests: No results for input(s): TSH, T4TOTAL, FREET4, T3FREE, THYROIDAB in the last 72 hours.  Anemia Panel: No results for input(s): VITAMINB12, FOLATE, FERRITIN, TIBC, IRON, RETICCTPCT in the last 72 hours.  Urine analysis: No results found for: COLORURINE, APPEARANCEUR, LABSPEC, PHURINE, GLUCOSEU, HGBUR, BILIRUBINUR, KETONESUR, PROTEINUR, UROBILINOGEN, NITRITE, LEUKOCYTESUR  Sepsis Labs: @LABRCNTIP (procalcitonin:4,lacticidven:4) )No results found for this or any previous visit (from the past 240 hour(s)).        Radiological Exams on Admission: Dg Chest 2 View  Result Date: 01/03/2017 CLINICAL DATA:  Shortness of breath. History of breast cancer undergoing chemotherapy. Last treatment was on Wednesday. History of diabetes, hypertension, COPD, former smoker. EXAM: CHEST  2 VIEW COMPARISON:  09/24/2016 FINDINGS: Power port type central venous catheter with tip overlying the mid SVC region. No pneumothorax. Mild cardiac enlargement. No pulmonary vascular congestion or edema. No focal consolidation. No blunting of costophrenic angles. No pneumothorax. Degenerative changes in the spine. Aortic calcification. IMPRESSION: Cardiac enlargement. No evidence  of active pulmonary disease. Aortic atherosclerosis. Electronically Signed   By: Lucienne Capers M.D.   On: 01/03/2017 03:28   Ct Angio Chest Pe W And/or Wo Contrast  Result Date: 01/03/2017 CLINICAL DATA:  Shortness of breath. Ongoing chemotherapy for breast cancer. History of TB, hypertension, diabetes, COPD. EXAM: CT ANGIOGRAPHY CHEST WITH CONTRAST TECHNIQUE: Multidetector CT imaging of the chest was performed using the standard protocol during bolus administration of intravenous contrast. Multiplanar CT image reconstructions and MIPs were obtained to evaluate the vascular anatomy. CONTRAST:  100 mL Isovue 370 COMPARISON:  09/20/2016 FINDINGS: Cardiovascular: There is moderately good opacification of the central and proximal segmental pulmonary arteries. More peripheral pulmonary arteries are not well evaluated. No visualized filling defects suggesting no evidence of significant pulmonary embolus. Normal caliber thoracic aorta. No dissection. Calcification of the aorta. Calcification of coronary arteries. Normal heart size. No pericardial effusion. Central venous catheter with tip in the superior vena cava. Mediastinum/Nodes: There is lymphadenopathy diffusely throughout the mediastinum, with pretracheal nodes measuring up to about 1.5 cm diameter. Nodes are  measuring smaller than on previous study. Bilateral hilar lymphadenopathy. Hilar nodes cause some extrinsic compression of central pulmonary artery's but without complete occlusion. Left axillary lymphadenopathy measuring 2.4 cm diameter. Esophagus is decompressed. Lungs/Pleura: Motion artifact limits evaluation. There is atelectasis in the lung bases. Patchy infiltrates suggested in the right middle lung unchanged since previous study probably representing radiation change. No pleural effusions. No pneumothorax. Material is demonstrated in lower lobe bronchi suggesting mucous or aspiration. Upper Abdomen: No acute process demonstrated in the visualized upper abdominal contents. Musculoskeletal: There is a large mass in the left breast measuring 4.6 cm. This appears smaller than on previous study. Degenerative changes in the spine. No destructive bone lesions. Review of the MIP images confirms the above findings. IMPRESSION: 1. No evidence of significant pulmonary embolus. 2. Large mass in the left breast appears smaller than on previous study. 3. Extensive metastatic lymphadenopathy throughout the mediastinum, bilateral hila, and left axilla. Lymph nodes are measuring smaller than on previous study. 4. Mediastinal lymph nodes cause extrinsic compression of central pulmonary arteries without complete occlusion. 5. Radiation changes in the right middle lung. Electronically Signed   By: Lucienne Capers M.D.   On: 01/03/2017 06:57   Dg Chest 2 View  Result Date: 01/03/2017 CLINICAL DATA:  Shortness of breath. History of breast cancer undergoing chemotherapy. Last treatment was on Wednesday. History of diabetes, hypertension, COPD, former smoker. EXAM: CHEST  2 VIEW COMPARISON:  09/24/2016 FINDINGS: Power port type central venous catheter with tip overlying the mid SVC region. No pneumothorax. Mild cardiac enlargement. No pulmonary vascular congestion or edema. No focal consolidation. No blunting of costophrenic  angles. No pneumothorax. Degenerative changes in the spine. Aortic calcification. IMPRESSION: Cardiac enlargement. No evidence of active pulmonary disease. Aortic atherosclerosis. Electronically Signed   By: Lucienne Capers M.D.   On: 01/03/2017 03:28   Ct Angio Chest Pe W And/or Wo Contrast  Result Date: 01/03/2017 CLINICAL DATA:  Shortness of breath. Ongoing chemotherapy for breast cancer. History of TB, hypertension, diabetes, COPD. EXAM: CT ANGIOGRAPHY CHEST WITH CONTRAST TECHNIQUE: Multidetector CT imaging of the chest was performed using the standard protocol during bolus administration of intravenous contrast. Multiplanar CT image reconstructions and MIPs were obtained to evaluate the vascular anatomy. CONTRAST:  100 mL Isovue 370 COMPARISON:  09/20/2016 FINDINGS: Cardiovascular: There is moderately good opacification of the central and proximal segmental pulmonary arteries. More peripheral pulmonary arteries are not well evaluated. No  visualized filling defects suggesting no evidence of significant pulmonary embolus. Normal caliber thoracic aorta. No dissection. Calcification of the aorta. Calcification of coronary arteries. Normal heart size. No pericardial effusion. Central venous catheter with tip in the superior vena cava. Mediastinum/Nodes: There is lymphadenopathy diffusely throughout the mediastinum, with pretracheal nodes measuring up to about 1.5 cm diameter. Nodes are measuring smaller than on previous study. Bilateral hilar lymphadenopathy. Hilar nodes cause some extrinsic compression of central pulmonary artery's but without complete occlusion. Left axillary lymphadenopathy measuring 2.4 cm diameter. Esophagus is decompressed. Lungs/Pleura: Motion artifact limits evaluation. There is atelectasis in the lung bases. Patchy infiltrates suggested in the right middle lung unchanged since previous study probably representing radiation change. No pleural effusions. No pneumothorax. Material is  demonstrated in lower lobe bronchi suggesting mucous or aspiration. Upper Abdomen: No acute process demonstrated in the visualized upper abdominal contents. Musculoskeletal: There is a large mass in the left breast measuring 4.6 cm. This appears smaller than on previous study. Degenerative changes in the spine. No destructive bone lesions. Review of the MIP images confirms the above findings. IMPRESSION: 1. No evidence of significant pulmonary embolus. 2. Large mass in the left breast appears smaller than on previous study. 3. Extensive metastatic lymphadenopathy throughout the mediastinum, bilateral hila, and left axilla. Lymph nodes are measuring smaller than on previous study. 4. Mediastinal lymph nodes cause extrinsic compression of central pulmonary arteries without complete occlusion. 5. Radiation changes in the right middle lung. Electronically Signed   By: Lucienne Capers M.D.   On: 01/03/2017 06:57      EKG: Independently reviewed. * Sinus tachycardia  Assessment/Plan Principal Problem:   COPD with acute exacerbation (HCC) Patient will be admitted to telemetry for COPD exacerbation Hold Decadron that was started post chemotherapy Continue empiric antibiotics, IV Solu-Medrol, Nebulizer treatments, inhaled steroids GOLD copd pathway initiated  Placed on 2 L of oxygen, however oxygen saturation was fine on presentation   Hypertension-continue lisinopril Hold HCTZ   Diabetes-significant hyperglycemia noted, likely secondary to Decadron Hold oral hypoglycemics due to IV contrast with CT Started on Lantus and sliding scale insulin Most recent hemoglobin A1c 6.7 on 09/19/16     Malignant neoplasm of upper-inner quadrant of left breast in female, estrogen receptor negative ([place])  Followed by Nicholas Lose, MD, status post chemotherapy 9/19  Leukocytosis likely secondary to neupogen, steroids, follow closely    DVT prophylaxis:  Lovenox  Code Status History  Full code          consults called: None  Family Communication: Admission, patients condition and plan of care including tests being ordered have been discussed with the patient  who indicates understanding and agree with the plan and Code Status  Admission status: inpatient    Disposition plan: Further plan will depend as patient's clinical course evolves and further radiologic and laboratory data become available. Likely home when stable   At the time of admission, it appears that the appropriate admission status for this patient is INPATIENT .Thisis judged to be reasonable and necessary in order to provide the required intensity of service to ensure the patient's safetygiven thepresenting symptoms, physical exam findings, and initial radiographic and laboratory data in the context of their chronic comorbidities.   Reyne Dumas MD [facility] Pager [phone]  If 7PM-7AM, please contact night-coverage [email or web address] Password [email or web address]  01/03/2017, 11:05 AM

## 2017-01-03 NOTE — ED Notes (Signed)
Patient transported to CT 

## 2017-01-03 NOTE — ED Provider Notes (Signed)
[facility] DEPT Provider Note   CSN: [id number] Arrival date & time: 01/03/17  0214     History   Chief Complaint Chief Complaint  Patient presents with  . Shortness of Breath    HPI Laurie Collins is a 76 y.o. female.  The history is provided by the patient.  Shortness of Breath  This is a recurrent problem. The average episode lasts 1 day. The problem occurs intermittently.The current episode started 12 to 24 hours ago. The problem has been gradually worsening. Associated symptoms include cough and wheezing. Pertinent negatives include no fever, no sputum production, no hemoptysis, no syncope, no abdominal pain, no leg pain and no leg swelling. It is unknown what precipitated the problem. The treatment provided no relief. Associated medical issues include COPD.    Past Medical History:  Diagnosis Date  . Anxiety   . Cancer ([place])    breast  . COPD (chronic obstructive pulmonary disease) ([place])   . Depression   . Diabetes mellitus without complication ([place])   . Dyspnea   . Hypertension   . PONV (postoperative nausea and vomiting)    very,very,very sick from anesthesia  . Tuberculosis    1962    Patient Active Problem List   Diagnosis Date Noted  . Genetic testing 10/04/2016  . Malignant neoplasm of upper-inner quadrant of left breast in female, estrogen receptor negative ([place]) 09/05/2016  . Malignant neoplasm of upper-inner quadrant of right breast in female, estrogen receptor positive ([place]) 09/05/2016    Past Surgical History:  Procedure Laterality Date  . BREAST SURGERY     5 breast lumpectomy  . PORTACATH PLACEMENT Right 09/24/2016   Procedure: INSERTION PORT-A-CATH;  Surgeon: [name], MD;  Location: [facility];  Service: General;  Laterality: Right;    OB History    No data available       Home Medications    Prior to Admission medications   Medication Sig Start Date End Date Taking? Authorizing Provider  amLODipine (NORVASC) 10 MG tablet  Take 10 mg by mouth daily.    [provider]  aspirin 81 MG chewable tablet Chew 81 mg by mouth daily.    [provider]  Cholecalciferol (VITAMIN D) 2000 units tablet Take 2,000 Units by mouth daily.    [provider]  clonazePAM (KLONOPIN) 0.5 MG tablet Take 0.5 mg by mouth at bedtime as needed for sleep or anxiety. 09/04/16   [provider]  dexamethasone (DECADRON) 4 MG tablet Take 2 tablets (8 mg total) by mouth daily. 12/25/16   Causey, Charlestine Massed, NP  glimepiride (AMARYL) 1 MG tablet Take 1 mg by mouth daily with breakfast.    [provider]  hydrochlorothiazide (MICROZIDE) 12.5 MG capsule Take 12.5 mg by mouth daily.    [provider]  lisinopril (PRINIVIL,ZESTRIL) 20 MG tablet Take 20 mg by mouth daily.    [provider]  loperamide (IMODIUM) 2 MG capsule Take 2 mg by mouth every 4 (four) hours as needed for diarrhea or loose stools.     [provider]  LORazepam (ATIVAN) 1 MG tablet Take 1 tablet (1 mg total) by mouth at bedtime. Patient not taking: Reported on 12/25/2016 09/11/16   Nicholas Lose, MD  metFORMIN (GLUCOPHAGE) 1000 MG tablet Take 1,000 mg by mouth 2 (two) times daily with a meal.    [provider]  metoprolol succinate (TOPROL-XL) 100 MG 24 hr tablet Take 100 mg by mouth daily. 07/16/16   [provider]  oxyCODONE (OXY IR/ROXICODONE) 5 MG immediate release tablet Take 1-2 tablets (5-10 mg total) by mouth every 6 (six) hours as needed for moderate pain, severe pain or breakthrough pain. 09/24/16   [name], MD  simvastatin (ZOCOR) 10 MG tablet Take 10 mg by mouth at bedtime. 07/16/16   [provider]  Triamcinolone Acetonide (TRIAMCINOLONE 0.1 % CREAM : EUCERIN) CREA Apply 1 application topically 2 (two) times daily as needed. 12/25/16   Gardenia Phlegm, NP    Family History Family History  Problem Relation Age of Onset  . Stomach cancer Mother 73        d.67 from metastatic disease  . Cancer Maternal Aunt        Unspecified type  . Cancer Maternal Uncle        Unspecified type    Social History Social History  Substance Use Topics  . Smoking status: Former Smoker    Packs/day: 2.00    Years: 25.00    Types: Cigarettes    Quit date: 2013  . Smokeless tobacco: Never Used  . Alcohol use No     Allergies   Doxycycline and Penicillins   Review of Systems Review of Systems  Constitutional: Negative for fever.  Respiratory: Positive for cough, shortness of breath and wheezing. Negative for hemoptysis and sputum production.   Cardiovascular: Negative for leg swelling and syncope.  Gastrointestinal: Negative for abdominal pain.  All other systems reviewed and are negative.    Physical Exam Updated Vital Signs BP 119/65 (BP Location: Right Arm)   Pulse 94   Temp 98.3 F (36.8 C) (Oral)   Resp 18   Ht 5\' 3"  (1.6 m)   Wt 86.2 kg (190 lb)   SpO2 95%   BMI 33.66 kg/m   Physical Exam  Constitutional: She is oriented to person, place, and time. She appears well-developed and well-nourished.  HENT:  Head: Normocephalic and atraumatic.  Mouth/Throat: No oropharyngeal exudate.  Eyes: Pupils are equal, round, and reactive to light. Conjunctivae are normal.  Neck: Normal range of motion. Neck supple.  Cardiovascular: Normal rate, regular rhythm, normal heart sounds and intact distal pulses.   Pulmonary/Chest: No respiratory distress. She has wheezes.  Abdominal: Soft. Bowel sounds are normal. She exhibits no mass. There is no tenderness. There is no rebound and no guarding.  Musculoskeletal: Normal range of motion.  Neurological: She is alert and oriented to person, place, and time.  Skin: Skin is warm and dry. Capillary refill takes less than 2 seconds. She is not diaphoretic.  Psychiatric: She has a normal mood and affect.     ED Treatments / Results   Vitals:   01/03/17 0227 01/03/17 0520  BP: (!) 153/62 119/65    Pulse: (!) 103 94  Resp: 14 18  Temp: 98.3 F (36.8 C)   SpO2: 97% 95%    Labs (all labs ordered are listed, but only abnormal results are displayed) Labs Reviewed  CBC WITH DIFFERENTIAL/PLATELET - Abnormal; Notable for the following:       Result Value   WBC 49.6 (*)    RBC 3.67 (*)    Hemoglobin 9.8 (*)    HCT 31.3 (*)    RDW 22.8 (*)    Neutro Abs 46.1 (*)    Monocytes Absolute 1.5 (*)    All other components within normal limits  BRAIN NATRIURETIC PEPTIDE - Abnormal; Notable for the following:    B Natriuretic Peptide 366.0 (*)    All  other components within normal limits  I-STAT CHEM 8, ED - Abnormal; Notable for the following:    BUN 41 (*)    Glucose, Bld 499 (*)    Hemoglobin 11.2 (*)    HCT 33.0 (*)    All other components within normal limits  I-STAT TROPONIN, ED    EKG  EKG Interpretation  Date/Time:  Friday January 03 2017 02:26:22 EDT Ventricular Rate:  104 PR Interval:    QRS Duration: 90 QT Interval:  354 QTC Calculation: 466 R Axis:   29 Text Interpretation:  Sinus tachycardia Confirmed by Dory Horn) on 01/03/2017 3:57:48 AM       Radiology Dg Chest 2 View  Result Date: 01/03/2017 CLINICAL DATA:  Shortness of breath. History of breast cancer undergoing chemotherapy. Last treatment was on Wednesday. History of diabetes, hypertension, COPD, former smoker. EXAM: CHEST  2 VIEW COMPARISON:  09/24/2016 FINDINGS: Power port type central venous catheter with tip overlying the mid SVC region. No pneumothorax. Mild cardiac enlargement. No pulmonary vascular congestion or edema. No focal consolidation. No blunting of costophrenic angles. No pneumothorax. Degenerative changes in the spine. Aortic calcification. IMPRESSION: Cardiac enlargement. No evidence of active pulmonary disease. Aortic atherosclerosis. Electronically Signed   By: Lucienne Capers M.D.   On: 01/03/2017 03:28    Procedures Procedures (including critical care  time)  Medications Ordered in ED Medications  levofloxacin (LEVAQUIN) IVPB 750 mg (not administered)  magnesium sulfate IVPB 2 g 50 mL (not administered)  albuterol (PROVENTIL) (2.5 MG/3ML) 0.083% nebulizer solution 5 mg (5 mg Nebulization Given 01/03/17 0327)  ipratropium (ATROVENT) nebulizer solution 0.5 mg (0.5 mg Nebulization Given 01/03/17 0327)  iopamidol (ISOVUE-370) 76 % injection 100 mL (100 mLs Intravenous Contrast Given 01/03/17 0604)  albuterol (PROVENTIL) (2.5 MG/3ML) 0.083% nebulizer solution 10 mg (10 mg Nebulization Given 01/03/17 0647)       Final Clinical Impressions(s) / ED Diagnoses  COPD exacerbation: will admit to medicine.     Elonda Giuliano, MD 01/03/17 [phone]

## 2017-01-03 NOTE — ED Notes (Signed)
Report given to floor RN

## 2017-01-03 NOTE — ED Notes (Signed)
Returned from xray

## 2017-01-03 NOTE — ED Notes (Signed)
Returned from CT.

## 2017-01-03 NOTE — ED Notes (Signed)
Patient transported to X-ray 

## 2017-01-03 NOTE — ED Notes (Signed)
Laurie Collins [phone], report scheduled 1515

## 2017-01-04 DIAGNOSIS — R069 Unspecified abnormalities of breathing: Secondary | ICD-10-CM | POA: Diagnosis not present

## 2017-01-04 DIAGNOSIS — D72829 Elevated white blood cell count, unspecified: Secondary | ICD-10-CM | POA: Diagnosis not present

## 2017-01-04 DIAGNOSIS — J441 Chronic obstructive pulmonary disease with (acute) exacerbation: Secondary | ICD-10-CM | POA: Diagnosis not present

## 2017-01-04 DIAGNOSIS — Z171 Estrogen receptor negative status [ER-]: Secondary | ICD-10-CM | POA: Diagnosis not present

## 2017-01-04 DIAGNOSIS — C50212 Malignant neoplasm of upper-inner quadrant of left female breast: Secondary | ICD-10-CM | POA: Diagnosis not present

## 2017-01-04 LAB — RESPIRATORY PANEL BY PCR
ADENOVIRUS-RVPPCR: NOT DETECTED
Bordetella pertussis: NOT DETECTED
CHLAMYDOPHILA PNEUMONIAE-RVPPCR: NOT DETECTED
CORONAVIRUS 229E-RVPPCR: NOT DETECTED
CORONAVIRUS OC43-RVPPCR: NOT DETECTED
Coronavirus HKU1: NOT DETECTED
Coronavirus NL63: NOT DETECTED
INFLUENZA A H1 2009-RVPPR: NOT DETECTED
INFLUENZA A H1-RVPPCR: NOT DETECTED
INFLUENZA B-RVPPCR: NOT DETECTED
Influenza A H3: NOT DETECTED
Influenza A: NOT DETECTED
MYCOPLASMA PNEUMONIAE-RVPPCR: NOT DETECTED
Metapneumovirus: NOT DETECTED
PARAINFLUENZA VIRUS 1-RVPPCR: NOT DETECTED
Parainfluenza Virus 2: NOT DETECTED
Parainfluenza Virus 3: NOT DETECTED
Parainfluenza Virus 4: NOT DETECTED
RESPIRATORY SYNCYTIAL VIRUS-RVPPCR: NOT DETECTED
Rhinovirus / Enterovirus: NOT DETECTED

## 2017-01-04 LAB — GLUCOSE, CAPILLARY
GLUCOSE-CAPILLARY: 298 mg/dL — AB (ref 65–99)
GLUCOSE-CAPILLARY: 256 mg/dL — AB (ref 65–99)
GLUCOSE-CAPILLARY: 299 mg/dL — AB (ref 65–99)
Glucose-Capillary: 265 mg/dL — ABNORMAL HIGH (ref 65–99)

## 2017-01-04 MED ORDER — BUDESONIDE 0.25 MG/2ML IN SUSP
0.2500 mg | Freq: Two times a day (BID) | RESPIRATORY_TRACT | Status: DC
  Administered 2017-01-04 – 2017-01-05 (×3): 0.25 mg via RESPIRATORY_TRACT
  Filled 2017-01-04 (×3): qty 2

## 2017-01-04 MED ORDER — ENOXAPARIN SODIUM 40 MG/0.4ML ~~LOC~~ SOLN
40.0000 mg | SUBCUTANEOUS | Status: DC
  Administered 2017-01-04 – 2017-01-05 (×2): 40 mg via SUBCUTANEOUS
  Filled 2017-01-04 (×2): qty 0.4

## 2017-01-04 MED ORDER — LEVALBUTEROL HCL 1.25 MG/0.5ML IN NEBU
1.2500 mg | INHALATION_SOLUTION | Freq: Three times a day (TID) | RESPIRATORY_TRACT | Status: DC
  Administered 2017-01-04 (×3): 1.25 mg via RESPIRATORY_TRACT
  Filled 2017-01-04 (×3): qty 0.5

## 2017-01-04 MED ORDER — LEVOFLOXACIN IN D5W 750 MG/150ML IV SOLN: 750.0000 mg | INTRAVENOUS | Status: DC

## 2017-01-04 MED ORDER — PREDNISONE 20 MG PO TABS
30.0000 mg | ORAL_TABLET | Freq: Every day | ORAL | Status: DC
  Administered 2017-01-05: 09:00:00 30 mg via ORAL
  Filled 2017-01-04: qty 1

## 2017-01-04 NOTE — Progress Notes (Signed)
PROGRESS NOTE  Laurie Collins ZLD:[id number] DOB: 05/23/1940 DOA: 01/03/2017 PCP: [name], MD   LOS: 1 day   Brief Narrative / Interim history: 76 year old female with a history of breast cancer,currently on neoadjuvant chemotherapy and today is cycle 1 of dose dense Adriamycin Cytoxan, last chemotherapy 9/19, hypertension, diabetes, COPD currently a nonsmoker who presented to the ER because of shortness of breath, wheezing.  She was admitted with COPD exacerbation  Assessment & Plan: Principal Problem:   COPD with acute exacerbation ([facility]) Active Problems:   Malignant neoplasm of upper-inner quadrant of left breast in female, estrogen receptor negative ([facility])   COPD exacerbation ([facility])   COPD exacerbation -Improving today, wheezing has resolved however still feels subjective shortness of breath when she ambulates.  She still feels "shaky" when she walks around.  Of note, PT recommended home health which patient is refusing -Continue IV steroids for today, changed to prednisone tomorrow, continue IV antibiotics today, change to p.o. tomorrow -Add Pulmicort this morning, scheduled Xopenex -She seems to be doing okay on room air, check with ambulation  Hypertension -Continue lisinopril  Type 2 diabetes -Most recent A1c of 6.7, sliding scale while here  Breast cancer -Outpatient follow-up with oncology   DVT prophylaxis: Lovenox Code Status: Full code Family Communication: No family at bedside Disposition Plan: Home when ready  Consultants:   None   Procedures:   None   Antimicrobials:  Levaquin 9/21 >>  Subjective: - no chest pain, no abdominal pain, nausea or vomiting.  Still complains of wheezing with ambulation and shortness of breath  Objective: Vitals:   01/03/17 2121 01/04/17 0503 01/04/17 0937 01/04/17 1320  BP: (!) 141/53 (!) 154/80  136/67  Pulse: 94 91  93  Resp: 16 16  20   Temp: 98.7 F (37.1 C) 97.8 F (36.6 C)  98 F (36.7 C)  TempSrc:  Oral Oral  Oral  SpO2: 94% 95% 98% 97%  Weight:      Height:        Intake/Output Summary (Last 24 hours) at 01/04/17 1356 Last data filed at 01/04/17 9030  Gross per 24 hour  Intake              120 ml  Output              600 ml  Net             -480 ml   Filed Weights   01/03/17 0227  Weight: 86.2 kg (190 lb)    Examination:  Constitutional: NAD Eyes: lids and conjunctivae normal ENMT: Mucous membranes are moist. Respiratory: clear to auscultation bilaterally, no wheezing, no crackles.  Overall decreased breath sounds Cardiovascular: Regular rate and rhythm, no murmurs / rubs / gallops. No LE edema.  Abdomen: no tenderness. Bowel sounds positive.  Skin: no rashes, lesions, ulcers. No induration Neurologic: CN 2-12 grossly intact. Strength 5/5 in all 4.  Psychiatric: Normal judgment and insight. Alert and oriented x 3. Normal mood.    Data Reviewed: I have independently reviewed following labs and imaging studies   CBC:  Recent Labs Lab 01/01/17 1007 01/03/17 0343 01/03/17 0401  WBC 13.8* 49.6*  --   NEUTROABS 9.7* 46.1*  --   HGB 10.6* 9.8* 11.2*  HCT 33.7* 31.3* 33.0*  MCV 84.6 85.3  --   PLT 292 349  --    Basic Metabolic Panel:  Recent Labs Lab 01/01/17 1007 01/03/17 0401 01/03/17 0912  NA 139 138 135  K 4.0  4.4 3.6  CL  --  101 100*  CO2 26  --  18*  GLUCOSE 248* 499* 404*  BUN 18.7 41* 43*  CREATININE 1.0 0.90 1.11*  CALCIUM 9.9  --  9.3   GFR: Estimated Creatinine Clearance: 44.9 mL/min (A) (by C-G formula based on SCr of 1.11 mg/dL (H)). Liver Function Tests:  Recent Labs Lab 01/01/17 1007 01/03/17 0912  AST 53* 30  ALT 50 37  ALKPHOS 71 71  BILITOT 0.45 0.8  PROT 7.8 7.6  ALBUMIN 3.4* 3.8   No results for input(s): LIPASE, AMYLASE in the last 168 hours. No results for input(s): AMMONIA in the last 168 hours. Coagulation Profile: No results for input(s): INR, PROTIME in the last 168 hours. Cardiac Enzymes: No results for  input(s): CKTOTAL, CKMB, CKMBINDEX, TROPONINI in the last 168 hours. BNP (last 3 results) No results for input(s): PROBNP in the last 8760 hours. HbA1C:  Recent Labs  01/03/17 0912  HGBA1C 7.0*   CBG:  Recent Labs Lab 01/03/17 1458 01/03/17 1639 01/03/17 2117 01/04/17 0902 01/04/17 1204  GLUCAP 401* 375* 326* 298* 256*   Lipid Profile: No results for input(s): CHOL, HDL, LDLCALC, TRIG, CHOLHDL, LDLDIRECT in the last 72 hours. Thyroid Function Tests: No results for input(s): TSH, T4TOTAL, FREET4, T3FREE, THYROIDAB in the last 72 hours. Anemia Panel: No results for input(s): VITAMINB12, FOLATE, FERRITIN, TIBC, IRON, RETICCTPCT in the last 72 hours. Urine analysis: No results found for: COLORURINE, APPEARANCEUR, LABSPEC, PHURINE, GLUCOSEU, HGBUR, BILIRUBINUR, KETONESUR, PROTEINUR, UROBILINOGEN, NITRITE, LEUKOCYTESUR Sepsis Labs: Invalid input(s): PROCALCITONIN, LACTICIDVEN  Recent Results (from the past 240 hour(s))  Respiratory Panel by PCR     Status: None   Collection Time: 01/03/17  9:08 AM  Result Value Ref Range Status   Adenovirus NOT DETECTED NOT DETECTED Final   Coronavirus 229E NOT DETECTED NOT DETECTED Final   Coronavirus HKU1 NOT DETECTED NOT DETECTED Final   Coronavirus NL63 NOT DETECTED NOT DETECTED Final   Coronavirus OC43 NOT DETECTED NOT DETECTED Final   Metapneumovirus NOT DETECTED NOT DETECTED Final   Rhinovirus / Enterovirus NOT DETECTED NOT DETECTED Final   Influenza A NOT DETECTED NOT DETECTED Final   Influenza A H1 NOT DETECTED NOT DETECTED Final   Influenza A H1 2009 NOT DETECTED NOT DETECTED Final   Influenza A H3 NOT DETECTED NOT DETECTED Final   Influenza B NOT DETECTED NOT DETECTED Final   Parainfluenza Virus 1 NOT DETECTED NOT DETECTED Final   Parainfluenza Virus 2 NOT DETECTED NOT DETECTED Final   Parainfluenza Virus 3 NOT DETECTED NOT DETECTED Final   Parainfluenza Virus 4 NOT DETECTED NOT DETECTED Final   Respiratory Syncytial Virus NOT  DETECTED NOT DETECTED Final   Bordetella pertussis NOT DETECTED NOT DETECTED Final   Chlamydophila pneumoniae NOT DETECTED NOT DETECTED Final   Mycoplasma pneumoniae NOT DETECTED NOT DETECTED Final    Comment: Performed at [facility], [address] [place], [place] [address]  Culture, blood (Routine X 2) w Reflex to ID Panel     Status: None (Preliminary result)   Collection Time: 01/03/17  9:11 AM  Result Value Ref Range Status   Specimen Description BLOOD PORTA CATH  Final   Special Requests   Final    BOTTLES DRAWN AEROBIC AND ANAEROBIC Blood Culture adequate volume   Culture   Final    NO GROWTH < 24 HOURS Performed at [facility], [place] [address] [place], [place] [address]    Report  Status PENDING  Incomplete  Culture, blood (Routine X 2) w Reflex to ID Panel     Status: None (Preliminary result)   Collection Time: 01/03/17  9:11 AM  Result Value Ref Range Status   Specimen Description BLOOD RIGHT ARM  Final   Special Requests   Final    BOTTLES DRAWN AEROBIC AND ANAEROBIC Blood Culture adequate volume   Culture   Final    NO GROWTH < 24 HOURS Performed at [facility], [address], [place], [place] [address]    Report Status PENDING  Incomplete      Radiology Studies: Dg Chest 2 View  Result Date: 01/03/2017 CLINICAL DATA:  Shortness of breath. History of breast cancer undergoing chemotherapy. Last treatment was on Wednesday. History of diabetes, hypertension, COPD, former smoker. EXAM: CHEST  2 VIEW COMPARISON:  09/24/2016 FINDINGS: Power port type central venous catheter with tip overlying the mid SVC region. No pneumothorax. Mild cardiac enlargement. No pulmonary vascular congestion or edema. No focal consolidation. No blunting of costophrenic angles. No pneumothorax. Degenerative changes in the spine. Aortic calcification. IMPRESSION: Cardiac enlargement. No evidence of active pulmonary disease. Aortic atherosclerosis. Electronically Signed   By:  Lucienne Capers M.D.   On: 01/03/2017 03:28   Ct Angio Chest Pe W And/or Wo Contrast  Result Date: 01/03/2017 CLINICAL DATA:  Shortness of breath. Ongoing chemotherapy for breast cancer. History of TB, hypertension, diabetes, COPD. EXAM: CT ANGIOGRAPHY CHEST WITH CONTRAST TECHNIQUE: Multidetector CT imaging of the chest was performed using the standard protocol during bolus administration of intravenous contrast. Multiplanar CT image reconstructions and MIPs were obtained to evaluate the vascular anatomy. CONTRAST:  100 mL Isovue 370 COMPARISON:  09/20/2016 FINDINGS: Cardiovascular: There is moderately good opacification of the central and proximal segmental pulmonary arteries. More peripheral pulmonary arteries are not well evaluated. No visualized filling defects suggesting no evidence of significant pulmonary embolus. Normal caliber thoracic aorta. No dissection. Calcification of the aorta. Calcification of coronary arteries. Normal heart size. No pericardial effusion. Central venous catheter with tip in the superior vena cava. Mediastinum/Nodes: There is lymphadenopathy diffusely throughout the mediastinum, with pretracheal nodes measuring up to about 1.5 cm diameter. Nodes are measuring smaller than on previous study. Bilateral hilar lymphadenopathy. Hilar nodes cause some extrinsic compression of central pulmonary artery's but without complete occlusion. Left axillary lymphadenopathy measuring 2.4 cm diameter. Esophagus is decompressed. Lungs/Pleura: Motion artifact limits evaluation. There is atelectasis in the lung bases. Patchy infiltrates suggested in the right middle lung unchanged since previous study probably representing radiation change. No pleural effusions. No pneumothorax. Material is demonstrated in lower lobe bronchi suggesting mucous or aspiration. Upper Abdomen: No acute process demonstrated in the visualized upper abdominal contents. Musculoskeletal: There is a large mass in the left  breast measuring 4.6 cm. This appears smaller than on previous study. Degenerative changes in the spine. No destructive bone lesions. Review of the MIP images confirms the above findings. IMPRESSION: 1. No evidence of significant pulmonary embolus. 2. Large mass in the left breast appears smaller than on previous study. 3. Extensive metastatic lymphadenopathy throughout the mediastinum, bilateral hila, and left axilla. Lymph nodes are measuring smaller than on previous study. 4. Mediastinal lymph nodes cause extrinsic compression of central pulmonary arteries without complete occlusion. 5. Radiation changes in the right middle lung. Electronically Signed   By: Lucienne Capers M.D.   On: 01/03/2017 06:57     Scheduled Meds: . aspirin  81 mg Oral Daily  . budesonide (PULMICORT)  nebulizer solution  0.25 mg Nebulization BID  . enoxaparin (LOVENOX) injection  40 mg Subcutaneous Q24H  . insulin aspart  0-9 Units Subcutaneous QID  . insulin glargine  30 Units Subcutaneous Daily  . levalbuterol  1.25 mg Nebulization Q8H  . lisinopril  20 mg Oral Daily  . methylPREDNISolone (SOLU-MEDROL) injection  60 mg Intravenous Q12H  . metoprolol succinate  100 mg Oral Daily  . simvastatin  10 mg Oral QHS   Continuous Infusions: . levofloxacin (LEVAQUIN) IV Stopped (01/04/17 1044)     Marzetta Board, MD, PhD [facility] Pager [phone]  If 7PM-7AM, please contact night-coverage [email or web address] Password [email or web address] 01/04/2017, 1:56 PM

## 2017-01-04 NOTE — Evaluation (Signed)
Physical Therapy One Time Evaluation Patient Details Name: Laurie Collins MRN: [id number] DOB: [birth year] Today's Date: 01/04/2017   History of Present Illness  76 year old female with a history of breast cancer,currently on neoadjuvant chemotherapy, last chemotherapy 9/19, hypertension, diabetes, COPD and admitted for COPD exacerbation  Clinical Impression  Patient evaluated by Physical Therapy with no further acute PT needs identified. All education has been completed and the patient has no further questions.  Pt ambulated in hallway and presents with unsteady gait which she reports is not her baseline however refuses to use assistive device.  SPO2 remained in upper 90s on room air during ambulation.  Pt declines assistive device and f/u PT as well as declines further acute PT at this time. See below for any follow-up Physical Therapy or equipment needs. PT is signing off. Thank you for this referral.     Follow Up Recommendations Home health PT;Supervision/Assistance - 24 hour (would benefit however pt declines)    Equipment Recommendations  Rolling walker with 5" wheels (pt declines)    Recommendations for Other Services       Precautions / Restrictions Precautions Precautions: Fall      Mobility  Bed Mobility               General bed mobility comments: pt sitting on window sit upon arrival  Transfers Overall transfer level: Needs assistance Equipment used: None Transfers: Sit to/from Stand Sit to Stand: Min guard         General transfer comment: min/guard for safety, unsteady upon standing however pt self corrected  Ambulation/Gait Ambulation/Gait assistance: Min guard Ambulation Distance (Feet): 120 Feet Assistive device:  (IV pole and hand rail) Gait Pattern/deviations: Step-through pattern;Decreased stride length     General Gait Details: pt very unsteady however refuses using RW, instead pushed IV pole and reaching for hand rail, SPO2 upper 90s on  room air during ambulation  Stairs            Wheelchair Mobility    Modified Rankin (Stroke Patients Only)       Balance Overall balance assessment: Needs assistance         Standing balance support: No upper extremity supported Standing balance-Leahy Scale: Fair                               Pertinent Vitals/Pain Pain Assessment: No/denies pain    Home Living Family/patient expects to be discharged to:: Private residence Living Arrangements: Spouse/significant other   Type of Home: House       Home Layout: One level Home Equipment: None      Prior Function Level of Independence: Independent               Hand Dominance        Extremity/Trunk Assessment   Upper Extremity Assessment Upper Extremity Assessment:  (pt reports numbness/tingling in hands ?chemo induced peripheral neuropathy)    Lower Extremity Assessment Lower Extremity Assessment: Generalized weakness       Communication   Communication: No difficulties  Cognition Arousal/Alertness: Awake/alert Behavior During Therapy: WFL for tasks assessed/performed Overall Cognitive Status: Within Functional Limits for tasks assessed                                 General Comments: appropriate during session however RN reports some difficulty with memory per observation  General Comments      Exercises     Assessment/Plan    PT Assessment All further PT needs can be met in the next venue of care  PT Problem List Decreased strength;Decreased mobility;Pain;Decreased activity tolerance;Decreased coordination;Decreased safety awareness;Decreased balance;Decreased knowledge of use of DME;Impaired sensation       PT Treatment Interventions DME instruction;Therapeutic exercise;Therapeutic activities;Gait training;Patient/family education;Functional mobility training;Balance training    PT Goals (Current goals can be found in the Care Plan section)   Acute Rehab PT Goals PT Goal Formulation: All assessment and education complete, DC therapy    Frequency     Barriers to discharge        Co-evaluation               AM-PAC PT "6 Clicks" Daily Activity  Outcome Measure Difficulty turning over in bed (including adjusting bedclothes, sheets and blankets)?: None Difficulty moving from lying on back to sitting on the side of the bed? : None Difficulty sitting down on and standing up from a chair with arms (e.g., wheelchair, bedside commode, etc,.)?: A Lot Help needed moving to and from a bed to chair (including a wheelchair)?: A Little Help needed walking in hospital room?: A Little Help needed climbing 3-5 steps with a railing? : A Lot 6 Click Score: 18    End of Session   Activity Tolerance: Patient tolerated treatment well Patient left: in chair;with call bell/phone within reach Nurse Communication: Mobility status PT Visit Diagnosis: Difficulty in walking, not elsewhere classified (R26.2)    Time: 7858-8502 PT Time Calculation (min) (ACUTE ONLY): 9 min   Charges:   PT Evaluation $PT Eval Low Complexity: 1 Low     PT G CodesCarmelia Bake, PT, DPT 01/04/2017 Pager: [phone]  York Ram E 01/04/2017, 12:40 PM

## 2017-01-04 NOTE — Progress Notes (Signed)
PHARMACY NOTE:  ANTIMICROBIAL RENAL DOSAGE ADJUSTMENT  Current antimicrobial regimen includes a mismatch between antimicrobial dosage and estimated renal function.  As per policy approved by the Pharmacy & Therapeutics and Medical Executive Committees, the antimicrobial dosage will be adjusted accordingly.  Current antimicrobial dosage:  Levaquin 750 mg IV q24h  Indication: acute exacerbation COPD  Renal Function:  Estimated Creatinine Clearance: 44.9 mL/min (A) (by C-G formula based on SCr of 1.11 mg/dL (H)). []      On intermittent HD, scheduled: []      On CRRT    Antimicrobial dosage has been changed to:  Levaquin 750 mg IV q48h  Additional comments:   Thank you for allowing pharmacy to be a part of this patient's care.  Leeroy Bock, [facility] 01/04/2017 3:03 PM

## 2017-01-05 DIAGNOSIS — D72829 Elevated white blood cell count, unspecified: Secondary | ICD-10-CM | POA: Diagnosis not present

## 2017-01-05 DIAGNOSIS — J441 Chronic obstructive pulmonary disease with (acute) exacerbation: Secondary | ICD-10-CM | POA: Diagnosis not present

## 2017-01-05 DIAGNOSIS — C50212 Malignant neoplasm of upper-inner quadrant of left female breast: Secondary | ICD-10-CM | POA: Diagnosis not present

## 2017-01-05 DIAGNOSIS — Z171 Estrogen receptor negative status [ER-]: Secondary | ICD-10-CM | POA: Diagnosis not present

## 2017-01-05 LAB — GLUCOSE, CAPILLARY
GLUCOSE-CAPILLARY: 169 mg/dL — AB (ref 65–99)
GLUCOSE-CAPILLARY: 210 mg/dL — AB (ref 65–99)

## 2017-01-05 MED ORDER — TIOTROPIUM BROMIDE MONOHYDRATE 18 MCG IN CAPS
18.0000 ug | ORAL_CAPSULE | Freq: Every day | RESPIRATORY_TRACT | 2 refills | Status: AC
Start: 2017-01-05 — End: 2018-01-05

## 2017-01-05 MED ORDER — LEVOFLOXACIN 750 MG PO TABS: 750.0000 mg | ORAL_TABLET | ORAL | 0 refills | Status: AC

## 2017-01-05 MED ORDER — LEVALBUTEROL HCL 1.25 MG/0.5ML IN NEBU
1.2500 mg | INHALATION_SOLUTION | Freq: Three times a day (TID) | RESPIRATORY_TRACT | Status: DC
  Administered 2017-01-05: 1.25 mg via RESPIRATORY_TRACT
  Filled 2017-01-05 (×2): qty 0.5

## 2017-01-05 MED ORDER — PREDNISONE 20 MG PO TABS: 20.0000 mg | ORAL_TABLET | Freq: Every day | ORAL | 0 refills | Status: DC

## 2017-01-05 MED ORDER — ALBUTEROL SULFATE HFA 108 (90 BASE) MCG/ACT IN AERS: 2.0000 | INHALATION_SPRAY | Freq: Four times a day (QID) | RESPIRATORY_TRACT | 2 refills | Status: AC | PRN

## 2017-01-05 MED ORDER — HEPARIN SOD (PORK) LOCK FLUSH 100 UNIT/ML IV SOLN
500.0000 [IU] | INTRAVENOUS | Status: AC | PRN
  Administered 2017-01-05: 500 [IU]

## 2017-01-05 NOTE — Progress Notes (Signed)
Nutrition Brief Note  Patient identified as COPD gold Spoke with pt at beside who reports no loss in appetite or unintentional wt loss. Pt eager to eat upon visit. Wt noted to be stable for over a year.   Wt Readings from Last 15 Encounters:  01/03/17 190 lb (86.2 kg)  01/01/17 190 lb 11.2 oz (86.5 kg)  12/25/16 190 lb 1.6 oz (86.2 kg)  12/04/16 187 lb 14.4 oz (85.2 kg)  11/20/16 187 lb 1.6 oz (84.9 kg)  11/06/16 185 lb 1.6 oz (84 kg)  10/23/16 180 lb 4.8 oz (81.8 kg)  10/09/16 181 lb 6.4 oz (82.3 kg)  10/02/16 180 lb 9.6 oz (81.9 kg)  09/25/16 186 lb 11.2 oz (84.7 kg)  09/24/16 185 lb 8 oz (84.1 kg)  09/19/16 185 lb 8 oz (84.1 kg)  09/11/16 190 lb 8 oz (86.4 kg)  06/27/15 184 lb 14.4 oz (83.9 kg)    Body mass index is 33.66 kg/m. Patient meets criteria for obese based on current BMI.   Current diet order is heart healthy, patient is consuming approximately 90% of meals at this time. Labs and medications reviewed.   No nutrition interventions warranted at this time. If nutrition issues arise, please consult RD.   Mariana Single RD, LDN Clinical Nutrition Pager # [phone]

## 2017-01-05 NOTE — Discharge Instructions (Signed)
Follow with [name], MD in 1-2 weeks  Continue Prednisone and Levaquin as prescribed. Do not take decadron while on Prednisone. After you finish your prednisone, take decadron as previously instructed by your oncologist  Please get a complete blood count and chemistry panel checked by your Primary MD at your next visit, and again as instructed by your Primary MD. Please get your medications reviewed and adjusted by your Primary MD.  Please request your Primary MD to go over all Hospital Tests and Procedure/Radiological results at the follow up, please get all Hospital records sent to your Prim MD by signing hospital release before you go home.  If you had Pneumonia of Lung problems at the Hospital: Please get a 2 view Chest X ray done in 6-8 weeks after hospital discharge or sooner if instructed by your Primary MD.  If you have Congestive Heart Failure: Please call your Cardiologist or Primary MD anytime you have any of the following symptoms:  1) 3 pound weight gain in 24 hours or 5 pounds in 1 week  2) shortness of breath, with or without a dry hacking cough  3) swelling in the hands, feet or stomach  4) if you have to sleep on extra pillows at night in order to breathe  Follow cardiac low salt diet and 1.5 lit/day fluid restriction.  If you have diabetes Accuchecks 4 times/day, Once in AM empty stomach and then before each meal. Log in all results and show them to your primary doctor at your next visit. If any glucose reading is under 80 or above 300 call your primary MD immediately.  If you have Seizure/Convulsions/Epilepsy: Please do not drive, operate heavy machinery, participate in activities at heights or participate in high speed sports until you have seen by Primary MD or a Neurologist and advised to do so again.  If you had Gastrointestinal Bleeding: Please ask your Primary MD to check a complete blood count within one week of discharge or at your next visit. Your  endoscopic/colonoscopic biopsies that are pending at the time of discharge, will also need to followed by your Primary MD.  Get Medicines reviewed and adjusted. Please take all your medications with you for your next visit with your Primary MD  Please request your Primary MD to go over all hospital tests and procedure/radiological results at the follow up, please ask your Primary MD to get all Hospital records sent to his/her office.  If you experience worsening of your admission symptoms, develop shortness of breath, life threatening emergency, suicidal or homicidal thoughts you must seek medical attention immediately by calling 911 or calling your MD immediately  if symptoms less severe.  You must read complete instructions/literature along with all the possible adverse reactions/side effects for all the Medicines you take and that have been prescribed to you. Take any new Medicines after you have completely understood and accpet all the possible adverse reactions/side effects.   Do not drive or operate heavy machinery when taking Pain medications.   Do not take more than prescribed Pain, Sleep and Anxiety Medications  Special Instructions: If you have smoked or chewed Tobacco  in the last 2 yrs please stop smoking, stop any regular Alcohol  and or any Recreational drug use.  Wear Seat belts while driving.  Please note You were cared for by a hospitalist during your hospital stay. If you have any questions about your discharge medications or the care you received while you were in the hospital after you are  discharged, you can call the unit and asked to speak with the hospitalist on call if the hospitalist that took care of you is not available. Once you are discharged, your primary care physician will handle any further medical issues. Please note that NO REFILLS for any discharge medications will be authorized once you are discharged, as it is imperative that you return to your primary care  physician (or establish a relationship with a primary care physician if you do not have one) for your aftercare needs so that they can reassess your need for medications and monitor your lab values.  You can reach the hospitalist office at phone [phone] or fax [phone]   If you do not have a primary care physician, you can call [phone] for a physician referral.  Activity: As tolerated with Full fall precautions use walker/cane & assistance as needed  Diet: regular  Disposition Home

## 2017-01-05 NOTE — Care Management Note (Signed)
Case Management Note  Patient Details  Name: Laurie Collins MRN: [id number] Date of Birth: 1940/06/30  Subjective/Objective:       COPD exacerbation             Action/Plan: Discharge Planning: NCM spoke to pt and states son assist her at home. States her son takes her to her appt. Pt states no DME needed.   PCP [name] MD  Expected Discharge Date:  01/05/17               Expected Discharge Plan:  Home/Self Care  In-House Referral:  NA  Discharge planning Services  CM Consult  Post Acute Care Choice:  NA Choice offered to:  NA  DME Arranged:  N/A DME Agency:  NA  HH Arranged:  NA HH Agency:  NA  Status of Service:  Completed, signed off  If discussed at [place] of Stay Meetings, dates discussed:    Additional Comments:  Erenest Rasher, RN 01/05/2017, 10:55 AM

## 2017-01-05 NOTE — Progress Notes (Signed)
Reviewed discharge information with patient and caregiver. Answered all questions. Patient/caregiver able to teach back medications and reasons to contact MD/911. Patient verbalizes importance of PCP follow up appointment.  Kaula Klenke M. Holiday Mcmenamin, RN  

## 2017-01-05 NOTE — Evaluation (Signed)
Occupational Therapy Evaluation Patient Details Name: Laurie Collins MRN: [id number] DOB: [birth year] Today's Date: 01/05/2017    History of Present Illness 76 year old female with a history of breast cancer,currently on neoadjuvant chemotherapy, last chemotherapy 9/19, hypertension, diabetes, COPD and admitted for COPD exacerbation   Clinical Impression   Pt admitted with the above diagnoses and presents with below problem list. PTA pt was independent with ADLs. Pt is currently setup to min guard with ADLs. Discussed general energy conservation strategies for ADLs. O2 on RA 93-98 during session. Pt is for d/c today. Declining HH therapy at this time. No further acute OT needs. OT signing off.    Follow Up Recommendations  Other (comment) (Would benefit from [facility] but declining )    Equipment Recommendations  None recommended by OT    Recommendations for Other Services       Precautions / Restrictions Precautions Precautions: Fall Restrictions Weight Bearing Restrictions: No      Mobility Bed Mobility Overal bed mobility: Modified Independent                Transfers Overall transfer level: Needs assistance Equipment used: None Transfers: Sit to/from Stand Sit to Stand: Min guard         General transfer comment: for safety. from EOB and toilet.    Balance Overall balance assessment: Needs assistance         Standing balance support: No upper extremity supported Standing balance-Leahy Scale: Fair Standing balance comment: external support at times during grooming tasks standing at sink                           ADL either performed or assessed with clinical judgement   ADL Overall ADL's : Needs assistance/impaired Eating/Feeding: Set up;Sitting   Grooming: Oral care;Wash/dry hands;Supervision/safety;Standing Grooming Details (indicate cue type and reason): external support of sink Upper Body Bathing: Set up;Sitting   Lower Body  Bathing: Sit to/from stand;Min guard   Upper Body Dressing : Set up;Sitting   Lower Body Dressing: Sit to/from stand;Min guard   Toilet Transfer: Min guard;Ambulation   Toileting- Clothing Manipulation and Hygiene: Supervision/safety;Sit to/from stand   Tub/ Shower Transfer: Walk-in shower;Min guard;Ambulation;3 in 1   Functional mobility during ADLs: Min guard General ADL Comments: Pt completed bed mobility, toilet transfer, pericare, grooming tasks and shower transfer as detailed above. Discussed general energy conservation strategies; declined educational handout.     Vision         Perception     Praxis      Pertinent Vitals/Pain Pain Assessment: No/denies pain     Hand Dominance     Extremity/Trunk Assessment Upper Extremity Assessment Upper Extremity Assessment: Generalized weakness;Overall [facility] for tasks assessed (pt reports numbness/tingling in hands ?chemo induced periphe)   Lower Extremity Assessment Lower Extremity Assessment: Defer to PT evaluation       Communication Communication Communication: No difficulties   Cognition Arousal/Alertness: Awake/alert Behavior During Therapy: WFL for tasks assessed/performed Overall Cognitive Status: No family/caregiver present to determine baseline cognitive functioning                                 General Comments: slow processing noted   General Comments  O2 on RA 93 EOB at start of session, 98 standing at end of session before returning to bed.     Exercises     Shoulder Instructions  Home Living Family/patient expects to be discharged to:: Private residence Living Arrangements: Children;Other (Comment) (son and daughter-in-law) Available Help at Discharge: Family Type of Home: House       Home Layout: One level     Bathroom Shower/Tub: Walk-in shower         Home Equipment: Shower seat          Prior Functioning/Environment Level of Independence: Independent                  OT Problem List:        OT Treatment/Interventions:      OT Goals(Current goals can be found in the care plan section)    OT Frequency:     Barriers to D/C:            Co-evaluation              AM-PAC PT "6 Clicks" Daily Activity     Outcome Measure Help from another person eating meals?: None Help from another person taking care of personal grooming?: A Little Help from another person toileting, which includes using toliet, bedpan, or urinal?: A Little Help from another person bathing (including washing, rinsing, drying)?: None Help from another person to put on and taking off regular upper body clothing?: None Help from another person to put on and taking off regular lower body clothing?: None 6 Click Score: 22   End of Session    Activity Tolerance: Patient tolerated treatment well Patient left: in bed;with call bell/phone within reach  OT Visit Diagnosis: Unsteadiness on feet (R26.81);Muscle weakness (generalized) (M62.81)                Time: 9244-6286 OT Time Calculation (min): 16 min Charges:  OT General Charges $OT Visit: 1 Visit OT Evaluation $OT Eval Low Complexity: 1 Low G-Codes: OT G-codes **NOT FOR INPATIENT CLASS** Functional Assessment Tool Used: AM-PAC 6 Clicks Daily Activity Functional Limitation: Self care Self Care Current Status (N8177): At least 20 percent but less than 40 percent impaired, limited or restricted Self Care Discharge Status [phone]): At least 20 percent but less than 40 percent impaired, limited or restricted     Hortencia Pilar 01/05/2017, 10:50 AM

## 2017-01-05 NOTE — Care Management Obs Status (Signed)
[place] NOTIFICATION   Patient Details  Name: Laurie Collins MRN: [id number] Date of Birth: 01/16/1941   Medicare Observation Status Notification Given:  Yes (delivered copy, explained notice. pt requested son, [name] review prior to signing. Left copy in room. )    Erenest Rasher, RN 01/05/2017, 10:57 AM

## 2017-01-05 NOTE — Discharge Summary (Signed)
Physician Discharge Summary  Laurie Collins TFT:[id number] DOB: 11-08-1940 DOA: 01/03/2017  PCP: Laurie Jordan, MD  Admit date: 01/03/2017 Discharge date: 01/05/2017  Admitted From: home Disposition:  home  Recommendations for Outpatient Follow-up:  1. Follow up with Dr. Lindi Collins in 3 days 2. Continue Prednisone and Levaquin for 4 more days  Home Health: refused home health Equipment/Devices: none   Discharge Condition: stable CODE STATUS: Full code Diet recommendation: regular  HPI: Per Dr. Allyson Sabal, 76 year old female with a history of breast cancer,currently on neoadjuvant chemotherapy and today is cycle 1 of dose dense Adriamycin Cytoxan, last chemotherapy 9/19, hypertension, diabetes, COPD currently a nonsmoker who presented to the ER because of shortness of breath, wheezing. Patient started on Decadron 2 days ago after her chemotherapy. She was brought in by EMS who provided her with albuterol nebulizer treatment. Patient found to be actively wheezing upon presentation. No hypoxia was noted. Patient denied any subjective fever, chills, rigors. Denies any orthopnea, paroxysmal nocturnal dyspnea  Hospital Course: Discharge Diagnoses:  Principal Problem:   COPD with acute exacerbation ([place]) Active Problems:   Malignant neoplasm of upper-inner quadrant of left breast in female, estrogen receptor negative (HCC)   COPD exacerbation (HCC)  COPD exacerbation -patient was admitted to the hospital with COPD exacerbation, started on steroids, nebulizers and empiric antibiotics. She improved with above mentioned measures, her wheezing resolved, and patient was able to ambulate in the hallway without further significant respiratory difficulties. She will have a quick prednisone taper and Levaquin for 4 days. She is not on any COPD meds at home, would benefit from Spiriva and Albuterol, prescribed on d/c Hypertension -Continue lisinopril Type 2 diabetes -Most recent A1c of 6.7 Breast cancer  -Outpatient follow-up with oncology, is seeing Dr. Lindi Collins in 3 days.  Discharge Instructions   Allergies as of 01/05/2017      Reactions   Doxycycline Anaphylaxis   Penicillins Hives, Swelling   Has patient had a PCN reaction causing immediate rash, facial/tongue/throat swelling, SOB or lightheadedness with hypotension: Yes Has patient had a PCN reaction causing severe rash involving mucus membranes or skin necrosis: No Has patient had a PCN reaction that required hospitalization: No Has patient had a PCN reaction occurring within the last 10 years: No If all of the above answers are "NO", then may proceed with Cephalosporin use.      Medication List    STOP taking these medications   LORazepam 1 MG tablet Commonly known as:  ATIVAN   oxyCODONE 5 MG immediate release tablet Commonly known as:  Oxy IR/ROXICODONE     TAKE these medications   albuterol 108 (90 Base) MCG/ACT inhaler Commonly known as:  PROVENTIL HFA;VENTOLIN HFA Inhale 2 puffs into the lungs every 6 (six) hours as needed for wheezing or shortness of breath.   amLODipine 10 MG tablet Commonly known as:  NORVASC Take 10 mg by mouth daily.   aspirin 81 MG chewable tablet Chew 81 mg by mouth daily.   dexamethasone 4 MG tablet Commonly known as:  DECADRON Take 2 tablets (8 mg total) by mouth daily.   glimepiride 1 MG tablet Commonly known as:  AMARYL Take 1 mg by mouth daily with breakfast.   hydrochlorothiazide 12.5 MG capsule Commonly known as:  MICROZIDE Take 12.5 mg by mouth daily.   levofloxacin 750 MG tablet Commonly known as:  LEVAQUIN Take 1 tablet (750 mg total) by mouth every other day.   lisinopril 20 MG tablet Commonly known as:  PRINIVIL,ZESTRIL Take  20 mg by mouth daily.   loperamide 2 MG capsule Commonly known as:  IMODIUM Take 2 mg by mouth every 4 (four) hours as needed for diarrhea or loose stools.   metFORMIN 1000 MG tablet Commonly known as:  GLUCOPHAGE Take 1,000 mg by mouth  2 (two) times daily with a meal.   metoprolol succinate 100 MG 24 hr tablet Commonly known as:  TOPROL-XL Take 100 mg by mouth daily.   predniSONE 20 MG tablet Commonly known as:  DELTASONE Take 1 tablet (20 mg total) by mouth daily with breakfast.   simvastatin 10 MG tablet Commonly known as:  ZOCOR Take 10 mg by mouth at bedtime.   tiotropium 18 MCG inhalation capsule Commonly known as:  SPIRIVA HANDIHALER Place 1 capsule (18 mcg total) into inhaler and inhale daily.   triamcinolone 0.1 % cream : eucerin Crea Apply 1 application topically 2 (two) times daily as needed.   Vitamin D 2000 units tablet Take 2,000 Units by mouth daily.            Discharge Care Instructions        Start     Ordered   01/05/17 0000  predniSONE (DELTASONE) 20 MG tablet  Daily with breakfast     01/05/17 0814   01/05/17 0000  levofloxacin (LEVAQUIN) 750 MG tablet  Every 48 hours     01/05/17 0814   01/05/17 0000  tiotropium (SPIRIVA HANDIHALER) 18 MCG inhalation capsule  Daily     01/05/17 0814   01/05/17 0000  albuterol (PROVENTIL HFA;VENTOLIN HFA) 108 (90 Base) MCG/ACT inhaler  Every 6 hours PRN     01/05/17 5366     Follow-up Information    Laurie Jordan, MD. Schedule an appointment as soon as possible for a visit in 2 week(s).   Specialty:  Family Medicine Contact information: [address] [place] [address] [phone]          Allergies  Allergen Reactions  . Doxycycline Anaphylaxis  . Penicillins Hives and Swelling    Has patient had a PCN reaction causing immediate rash, facial/tongue/throat swelling, SOB or lightheadedness with hypotension: Yes Has patient had a PCN reaction causing severe rash involving mucus membranes or skin necrosis: No Has patient had a PCN reaction that required hospitalization: No Has patient had a PCN reaction occurring within the last 10 years: No If all of the above answers are "NO", then may proceed with  Cephalosporin use.     Consultations:    Procedures/Studies:  Dg Chest 2 View  Result Date: 01/03/2017 CLINICAL DATA:  Shortness of breath. History of breast cancer undergoing chemotherapy. Last treatment was on Wednesday. History of diabetes, hypertension, COPD, former smoker. EXAM: CHEST  2 VIEW COMPARISON:  09/24/2016 FINDINGS: Power port type central venous catheter with tip overlying the mid SVC region. No pneumothorax. Mild cardiac enlargement. No pulmonary vascular congestion or edema. No focal consolidation. No blunting of costophrenic angles. No pneumothorax. Degenerative changes in the spine. Aortic calcification. IMPRESSION: Cardiac enlargement. No evidence of active pulmonary disease. Aortic atherosclerosis. Electronically Signed   By: Lucienne Capers M.D.   On: 01/03/2017 03:28   Ct Angio Chest Pe W And/or Wo Contrast  Result Date: 01/03/2017 CLINICAL DATA:  Shortness of breath. Ongoing chemotherapy for breast cancer. History of TB, hypertension, diabetes, COPD. EXAM: CT ANGIOGRAPHY CHEST WITH CONTRAST TECHNIQUE: Multidetector CT imaging of the chest was performed using the standard protocol during bolus administration of intravenous contrast. Multiplanar CT image  reconstructions and MIPs were obtained to evaluate the vascular anatomy. CONTRAST:  100 mL Isovue 370 COMPARISON:  09/20/2016 FINDINGS: Cardiovascular: There is moderately good opacification of the central and proximal segmental pulmonary arteries. More peripheral pulmonary arteries are not well evaluated. No visualized filling defects suggesting no evidence of significant pulmonary embolus. Normal caliber thoracic aorta. No dissection. Calcification of the aorta. Calcification of coronary arteries. Normal heart size. No pericardial effusion. Central venous catheter with tip in the superior vena cava. Mediastinum/Nodes: There is lymphadenopathy diffusely throughout the mediastinum, with pretracheal nodes measuring up to about  1.5 cm diameter. Nodes are measuring smaller than on previous study. Bilateral hilar lymphadenopathy. Hilar nodes cause some extrinsic compression of central pulmonary artery's but without complete occlusion. Left axillary lymphadenopathy measuring 2.4 cm diameter. Esophagus is decompressed. Lungs/Pleura: Motion artifact limits evaluation. There is atelectasis in the lung bases. Patchy infiltrates suggested in the right middle lung unchanged since previous study probably representing radiation change. No pleural effusions. No pneumothorax. Material is demonstrated in lower lobe bronchi suggesting mucous or aspiration. Upper Abdomen: No acute process demonstrated in the visualized upper abdominal contents. Musculoskeletal: There is a large mass in the left breast measuring 4.6 cm. This appears smaller than on previous study. Degenerative changes in the spine. No destructive bone lesions. Review of the MIP images confirms the above findings. IMPRESSION: 1. No evidence of significant pulmonary embolus. 2. Large mass in the left breast appears smaller than on previous study. 3. Extensive metastatic lymphadenopathy throughout the mediastinum, bilateral hila, and left axilla. Lymph nodes are measuring smaller than on previous study. 4. Mediastinal lymph nodes cause extrinsic compression of central pulmonary arteries without complete occlusion. 5. Radiation changes in the right middle lung. Electronically Signed   By: Lucienne Capers M.D.   On: 01/03/2017 06:57      Subjective: - no chest pain, shortness of breath, no abdominal pain, nausea or vomiting.   Discharge Exam: Vitals:   01/05/17 0458 01/05/17 0910  BP: (!) 144/68   Pulse: 88   Resp: 18   Temp: 98.3 F (36.8 C)   SpO2: 99% 97%    General: Pt is alert, awake, not in acute distress Cardiovascular: RRR, S1/S2 +, no rubs, no gallops Respiratory: CTA bilaterally, no wheezing, no rhonchi Abdominal: Soft, NT, ND, bowel sounds + Extremities: no  edema, no cyanosis    The results of significant diagnostics from this hospitalization (including imaging, microbiology, ancillary and laboratory) are listed below for reference.     Microbiology: Recent Results (from the past 240 hour(s))  Respiratory Panel by PCR     Status: None   Collection Time: 01/03/17  9:08 AM  Result Value Ref Range Status   Adenovirus NOT DETECTED NOT DETECTED Final   Coronavirus 229E NOT DETECTED NOT DETECTED Final   Coronavirus HKU1 NOT DETECTED NOT DETECTED Final   Coronavirus NL63 NOT DETECTED NOT DETECTED Final   Coronavirus OC43 NOT DETECTED NOT DETECTED Final   Metapneumovirus NOT DETECTED NOT DETECTED Final   Rhinovirus / Enterovirus NOT DETECTED NOT DETECTED Final   Influenza A NOT DETECTED NOT DETECTED Final   Influenza A H1 NOT DETECTED NOT DETECTED Final   Influenza A H1 2009 NOT DETECTED NOT DETECTED Final   Influenza A H3 NOT DETECTED NOT DETECTED Final   Influenza B NOT DETECTED NOT DETECTED Final   Parainfluenza Virus 1 NOT DETECTED NOT DETECTED Final   Parainfluenza Virus 2 NOT DETECTED NOT DETECTED Final   Parainfluenza Virus 3 NOT DETECTED  NOT DETECTED Final   Parainfluenza Virus 4 NOT DETECTED NOT DETECTED Final   Respiratory Syncytial Virus NOT DETECTED NOT DETECTED Final   Bordetella pertussis NOT DETECTED NOT DETECTED Final   Chlamydophila pneumoniae NOT DETECTED NOT DETECTED Final   Mycoplasma pneumoniae NOT DETECTED NOT DETECTED Final    Comment: Performed at [facility], [place] [address], [place], [place] [address]  Culture, blood (Routine X 2) w Reflex to ID Panel     Status: None (Preliminary result)   Collection Time: 01/03/17  9:11 AM  Result Value Ref Range Status   Specimen Description BLOOD PORTA CATH  Final   Special Requests   Final    BOTTLES DRAWN AEROBIC AND ANAEROBIC Blood Culture adequate volume   Culture   Final    NO GROWTH 2 DAYS Performed at [facility], [place] [address], [place], [place]  [address]    Report Status PENDING  Incomplete  Culture, blood (Routine X 2) w Reflex to ID Panel     Status: None (Preliminary result)   Collection Time: 01/03/17  9:11 AM  Result Value Ref Range Status   Specimen Description BLOOD RIGHT ARM  Final   Special Requests   Final    BOTTLES DRAWN AEROBIC AND ANAEROBIC Blood Culture adequate volume   Culture   Final    NO GROWTH 2 DAYS Performed at [facility], [address] [place], [place] [address]    Report Status PENDING  Incomplete     Labs: BNP (last 3 results)  Recent Labs  01/03/17 0343  BNP 209.4*   Basic Metabolic Panel:  Recent Labs Lab 01/01/17 1007 01/03/17 0401 01/03/17 0912  NA 139 138 135  K 4.0 4.4 3.6  CL  --  101 100*  CO2 26  --  18*  GLUCOSE 248* 499* 404*  BUN 18.7 41* 43*  CREATININE 1.0 0.90 1.11*  CALCIUM 9.9  --  9.3   Liver Function Tests:  Recent Labs Lab 01/01/17 1007 01/03/17 0912  AST 53* 30  ALT 50 37  ALKPHOS 71 71  BILITOT 0.45 0.8  PROT 7.8 7.6  ALBUMIN 3.4* 3.8   No results for input(s): LIPASE, AMYLASE in the last 168 hours. No results for input(s): AMMONIA in the last 168 hours. CBC:  Recent Labs Lab 01/01/17 1007 01/03/17 0343 01/03/17 0401  WBC 13.8* 49.6*  --   NEUTROABS 9.7* 46.1*  --   HGB 10.6* 9.8* 11.2*  HCT 33.7* 31.3* 33.0*  MCV 84.6 85.3  --   PLT 292 349  --    Cardiac Enzymes: No results for input(s): CKTOTAL, CKMB, CKMBINDEX, TROPONINI in the last 168 hours. BNP: Invalid input(s): POCBNP CBG:  Recent Labs Lab 01/04/17 1204 01/04/17 1719 01/04/17 2138 01/05/17 0756 01/05/17 1157  GLUCAP 256* 265* 299* 169* 210*   D-Dimer No results for input(s): DDIMER in the last 72 hours. Hgb A1c  Recent Labs  01/03/17 0912  HGBA1C 7.0*   Lipid Profile No results for input(s): CHOL, HDL, LDLCALC, TRIG, CHOLHDL, LDLDIRECT in the last 72 hours. Thyroid function studies No results for input(s): TSH, T4TOTAL, T3FREE, THYROIDAB in the last  72 hours.  Invalid input(s): FREET3 Anemia work up No results for input(s): VITAMINB12, FOLATE, FERRITIN, TIBC, IRON, RETICCTPCT in the last 72 hours. Urinalysis No results found for: COLORURINE, APPEARANCEUR, LABSPEC, [place], GLUCOSEU, HGBUR, BILIRUBINUR, KETONESUR, PROTEINUR, UROBILINOGEN, NITRITE, LEUKOCYTESUR Sepsis Labs Invalid input(s): PROCALCITONIN,  WBC,  LACTICIDVEN   Time  coordinating discharge: 35 minutes  SIGNED:  Marzetta Board, MD  [facility] 01/05/2017, 3:24 PM Pager [phone]  If 7PM-7AM, please contact night-coverage [email or web address] Password [email or web address]

## 2017-01-05 NOTE — Care Management CC44 (Signed)
Condition Code 44 Documentation Completed  Patient Details  Name: MATTALYNN CRANDLE MRN: [id number] Date of Birth: [birth year]   Condition Code 44 given:  Yes Patient signature on Condition Code 44 notice:  Yes Documentation of 2 MD's agreement:  Yes Code 44 added to claim:  Yes    Erenest Rasher, RN 01/05/2017, 10:57 AM

## 2017-01-08 ENCOUNTER — Ambulatory Visit (HOSPITAL_BASED_OUTPATIENT_CLINIC_OR_DEPARTMENT_OTHER): Payer: Medicare HMO | Admitting: Hematology and Oncology

## 2017-01-08 ENCOUNTER — Encounter: Payer: Self-pay | Admitting: Hematology and Oncology

## 2017-01-08 ENCOUNTER — Ambulatory Visit: Payer: Medicare HMO

## 2017-01-08 ENCOUNTER — Other Ambulatory Visit (HOSPITAL_BASED_OUTPATIENT_CLINIC_OR_DEPARTMENT_OTHER): Payer: Medicare HMO

## 2017-01-08 DIAGNOSIS — R110 Nausea: Secondary | ICD-10-CM | POA: Diagnosis not present

## 2017-01-08 DIAGNOSIS — Z171 Estrogen receptor negative status [ER-]: Principal | ICD-10-CM

## 2017-01-08 DIAGNOSIS — C50212 Malignant neoplasm of upper-inner quadrant of left female breast: Secondary | ICD-10-CM

## 2017-01-08 DIAGNOSIS — M898X9 Other specified disorders of bone, unspecified site: Secondary | ICD-10-CM

## 2017-01-08 DIAGNOSIS — R109 Unspecified abdominal pain: Secondary | ICD-10-CM

## 2017-01-08 DIAGNOSIS — D701 Agranulocytosis secondary to cancer chemotherapy: Secondary | ICD-10-CM

## 2017-01-08 DIAGNOSIS — Z170 Estrogen receptor positive status [ER+]: Secondary | ICD-10-CM

## 2017-01-08 DIAGNOSIS — J449 Chronic obstructive pulmonary disease, unspecified: Secondary | ICD-10-CM

## 2017-01-08 DIAGNOSIS — C50211 Malignant neoplasm of upper-inner quadrant of right female breast: Secondary | ICD-10-CM

## 2017-01-08 LAB — CBC WITH DIFFERENTIAL/PLATELET
BASO%: 0.4 % (ref 0.0–2.0)
BASOS ABS: 0 10*3/uL (ref 0.0–0.1)
EOS ABS: 0.1 10*3/uL (ref 0.0–0.5)
EOS%: 7.5 % — ABNORMAL HIGH (ref 0.0–7.0)
HEMATOCRIT: 33.6 % — AB (ref 34.8–46.6)
HEMOGLOBIN: 10.6 g/dL — AB (ref 11.6–15.9)
LYMPH%: 53.8 % — ABNORMAL HIGH (ref 14.0–49.7)
MCH: 26.8 pg (ref 25.1–34.0)
MCHC: 31.6 g/dL (ref 31.5–36.0)
MCV: 85 fL (ref 79.5–101.0)
MONO#: 0.1 10*3/uL (ref 0.1–0.9)
MONO%: 7.3 % (ref 0.0–14.0)
NEUT#: 0.2 10*3/uL — CL (ref 1.5–6.5)
NEUT%: 31 % — AB (ref 38.4–76.8)
Platelets: 88 10*3/uL — ABNORMAL LOW (ref 145–400)
RBC: 3.96 10*6/uL (ref 3.70–5.45)
RDW: 21.4 % — AB (ref 11.2–14.5)
WBC: 0.7 10*3/uL — CL (ref 3.9–10.3)
lymph#: 0.4 10*3/uL — ABNORMAL LOW (ref 0.9–3.3)

## 2017-01-08 LAB — COMPREHENSIVE METABOLIC PANEL
ALT: 88 U/L — ABNORMAL HIGH (ref 0–55)
ANION GAP: 9 meq/L (ref 3–11)
AST: 46 U/L — ABNORMAL HIGH (ref 5–34)
Albumin: 3.5 g/dL (ref 3.5–5.0)
Alkaline Phosphatase: 86 U/L (ref 40–150)
BUN: 26.3 mg/dL — ABNORMAL HIGH (ref 7.0–26.0)
CO2: 30 mEq/L — ABNORMAL HIGH (ref 22–29)
Calcium: 9.9 mg/dL (ref 8.4–10.4)
Chloride: 100 mEq/L (ref 98–109)
Creatinine: 0.8 mg/dL (ref 0.6–1.1)
EGFR: 67 mL/min/{1.73_m2} — AB (ref >90)
Glucose: 214 mg/dl — ABNORMAL HIGH (ref 70–140)
POTASSIUM: 4.6 meq/L (ref 3.5–5.1)
Sodium: 139 mEq/L (ref 136–145)
Total Bilirubin: 0.85 mg/dL (ref 0.20–1.20)
Total Protein: 7.4 g/dL (ref 6.4–8.3)

## 2017-01-08 LAB — CULTURE, BLOOD (ROUTINE X 2)
Culture: NO GROWTH
Culture: NO GROWTH
SPECIAL REQUESTS: ADEQUATE
Special Requests: ADEQUATE

## 2017-01-08 NOTE — Assessment & Plan Note (Signed)
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 Neoadjuvant chemotherapy: Weekly Taxol 12 and Adriamycin Cytoxan every 2 weeks 4 2. Followed by surgery 3. Followed by radiation 4. Followed by antiestrogen therapy because of right-sided breast cancer is ER positive ------------------------------------------------------------------- Current treatment: Adriamycin/Cytoxan cycle 1 day 8 Echocardiogram 12/30/2016: EF 65-70% Labs have been reviewed

## 2017-01-08 NOTE — Progress Notes (Signed)
Patient Care Team: [name], MD as PCP - General (Family Medicine) [name], MD as Consulting Physician (General Surgery) Nicholas Lose, MD as Consulting Physician (Hematology and Oncology)  DIAGNOSIS:  Encounter Diagnosis  Name Primary?  . Malignant neoplasm of upper-inner quadrant of left breast in female, estrogen receptor negative ([place])     SUMMARY OF ONCOLOGIC HISTORY:   Malignant neoplasm of upper-inner quadrant of left breast in female, estrogen receptor negative ([place])   09/02/2016 Initial Diagnosis    Bilateral breast cancers: Left breast 11:30 position 6.3 cm with 3 abnormal lymph nodes biopsy of both IDC grade 2 LVI, triple negative, Ki-67 90% T3 N1 stage IIIB; right breast multiple masses 1.4 cm and 1:00, 5 mm 12:30 axilla negative biopsy ILC grade 2, ER 90%, PR 30%, Ki-67 10%, HER-2 negative ratio 1.74, T1 cN0 stage IA      09/25/2016 -  Neo-Adjuvant Chemotherapy    Taxol weekly x 12      10/03/2016 Genetic Testing    Underwent genetic counseling 10/03/2016. Declined genetic testing.      01/03/2017 - 01/05/2017 Hospital Admission    Shortness of breath and wheezing from COPD exacerbation       Malignant neoplasm of upper-inner quadrant of right breast in female, estrogen receptor positive ([place])   09/05/2016 Initial Diagnosis    Malignant neoplasm of upper-inner quadrant of right breast in female, estrogen receptor positive ([place])     10/03/2016 Genetic Testing    Underwent genetic counseling 10/03/2016. Declined genetic testing.       CHIEF COMPLIANT: Marked improvement of shortness of breath, complains of fatigue, bruising related to Neulasta  INTERVAL HISTORY: Laurie Collins is a 76 year old with above-mentioned history of left breast cancer currently on neoadjuvant chemotherapy and today is cycle 1 day 8 of dose dense Adriamycin and Cytoxan. After the first cycle shows no hospital with shortness of breath and was diagnosed with COPD exacerbation.  She is feeling remarkably better compared to that. She currently feels aches and pains as well as some nausea. She tells me that the Neulasta has given her a lot of problem and has a big bruise on her abdominal wall related to this. She does not want to receive Neulasta in the future. She denies fevers or chills. Her biggest complaint is related to the number of pills that she has to take every day.  REVIEW OF SYSTEMS:   Constitutional: Denies fevers, chills or abnormal weight loss Eyes: Denies blurriness of vision Ears, nose, mouth, throat, and face: Denies mucositis or sore throat Respiratory: Denies cough, dyspnea or wheezes Cardiovascular: Denies palpitation, chest discomfort Gastrointestinal:  Denies nausea, heartburn or change in bowel habits Skin: Denies abnormal skin rashes Lymphatics: Denies new lymphadenopathy or easy bruising Neurological:Denies numbness, tingling or new weaknesses Behavioral/Psych: Mood is stable, no new changes  Extremities: No lower extremity edema  All other systems were reviewed with the patient and are negative.  I have reviewed the past medical history, past surgical history, social history and family history with the patient and they are unchanged from previous note.  ALLERGIES:  is allergic to doxycycline and penicillins.  MEDICATIONS:  Current Outpatient Prescriptions  Medication Sig Dispense Refill  . albuterol (PROVENTIL HFA;VENTOLIN HFA) 108 (90 Base) MCG/ACT inhaler Inhale 2 puffs into the lungs every 6 (six) hours as needed for wheezing or shortness of breath. 1 Inhaler 2  . amLODipine (NORVASC) 10 MG tablet Take 10 mg by mouth daily.    Marland Kitchen aspirin 81  MG chewable tablet Chew 81 mg by mouth daily.    . Cholecalciferol (VITAMIN D) 2000 units tablet Take 2,000 Units by mouth daily.    Marland Kitchen dexamethasone (DECADRON) 4 MG tablet Take 2 tablets (8 mg total) by mouth daily. 60 tablet 1  . glimepiride (AMARYL) 1 MG tablet Take 1 mg by mouth daily with  breakfast.    . hydrochlorothiazide (MICROZIDE) 12.5 MG capsule Take 12.5 mg by mouth daily.    Marland Kitchen levofloxacin (LEVAQUIN) 750 MG tablet Take 1 tablet (750 mg total) by mouth every other day. 2 tablet 0  . lisinopril (PRINIVIL,ZESTRIL) 20 MG tablet Take 20 mg by mouth daily.    Marland Kitchen loperamide (IMODIUM) 2 MG capsule Take 2 mg by mouth every 4 (four) hours as needed for diarrhea or loose stools.     . metFORMIN (GLUCOPHAGE) 1000 MG tablet Take 1,000 mg by mouth 2 (two) times daily with a meal.    . metoprolol succinate (TOPROL-XL) 100 MG 24 hr tablet Take 100 mg by mouth daily.    . predniSONE (DELTASONE) 20 MG tablet Take 1 tablet (20 mg total) by mouth daily with breakfast. 4 tablet 0  . simvastatin (ZOCOR) 10 MG tablet Take 10 mg by mouth at bedtime.    Marland Kitchen tiotropium (SPIRIVA HANDIHALER) 18 MCG inhalation capsule Place 1 capsule (18 mcg total) into inhaler and inhale daily. 30 capsule 2  . Triamcinolone Acetonide (TRIAMCINOLONE 0.1 % CREAM : EUCERIN) CREA Apply 1 application topically 2 (two) times daily as needed. 1 each 1   No current facility-administered medications for this visit.     PHYSICAL EXAMINATION: ECOG PERFORMANCE STATUS: 1 - Symptomatic but completely ambulatory  Vitals:   01/08/17 1514  BP: (!) 147/74  Pulse: 99  Resp: 18  Temp: 98.6 F (37 C)  SpO2: 100%   Filed Weights   01/08/17 1514  Weight: 185 lb 1.6 oz (84 kg)    GENERAL:alert, no distress and comfortable SKIN: skin color, texture, turgor are normal, no rashes or significant lesions EYES: normal, Conjunctiva are pink and non-injected, sclera clear OROPHARYNX:no exudate, no erythema and lips, buccal mucosa, and tongue normal  NECK: supple, thyroid normal size, non-tender, without nodularity LYMPH:  no palpable lymphadenopathy in the cervical, axillary or inguinal LUNGS: clear to auscultation and percussion with normal breathing effort HEART: regular rate & rhythm and no murmurs and no lower extremity  edema ABDOMEN:abdomen soft, non-tender and normal bowel sounds MUSCULOSKELETAL:no cyanosis of digits and no clubbing  NEURO: alert & oriented x 3 with fluent speech, no focal motor/sensory deficits EXTREMITIES: No lower extremity edema  LABORATORY DATA:  I have reviewed the data as listed   Chemistry      Component Value Date/Time   NA 139 01/08/2017 1439   K 4.6 01/08/2017 1439   CL 100 (L) 01/03/2017 0912   CO2 30 (H) 01/08/2017 1439   BUN 26.3 (H) 01/08/2017 1439   CREATININE 0.8 01/08/2017 1439      Component Value Date/Time   CALCIUM 9.9 01/08/2017 1439   ALKPHOS 86 01/08/2017 1439   AST 46 (H) 01/08/2017 1439   ALT 88 (H) 01/08/2017 1439   BILITOT 0.85 01/08/2017 1439       Lab Results  Component Value Date   WBC 0.7 (LL) 01/08/2017   HGB 10.6 (L) 01/08/2017   HCT 33.6 (L) 01/08/2017   MCV 85.0 01/08/2017   PLT 88 (L) 01/08/2017   NEUTROABS 0.2 (LL) 01/08/2017    ASSESSMENT & PLAN:  Malignant neoplasm of upper-inner quadrant of left breast in female, estrogen receptor negative ([place]) Bilateral breast cancers:  Left breast11:30 position 6.3 cm with 3 abnormal lymph nodes biopsy of both IDC grade 2 LVI, triple negative, Ki-67 90% T3 N1 stage IIIB; Right breastmultiple masses 1.4 cm and 1:00, 5 mm 12:30 axilla negative biopsy ILC grade 2, ER 90%, PR 30%, Ki-67 10%, HER-2 negative ratio 1.74, T1 cN0 stage IA  Treatment plan: 1. Neoadjuvant chemotherapy: Weekly Taxol 12 and Adriamycin Cytoxan every 2 weeks 4 2. Followed by surgery 3. Followed by radiation 4. Followed by antiestrogen therapy because of right-sided breast cancer is ER positive ------------------------------------------------------------------- Current treatment: Adriamycin/Cytoxan cycle 1 day 8 Echocardiogram 12/30/2016: EF 65-70% Labs have been reviewed Chemotherapy toxicities: 1. Neutropenia: Which is expected for day 8 of chemotherapy. 2. hospitalization for COPD exacerbation 3. Neulasta  related bone pain as well as abdominal pain and bruising: Patient does not want to take Neulasta any further. I plan to discontinue Neulasta and then treat her for cycle 3 and 4 every 3 weeks instead of every 2 weeks in a dose dense fashion.   We will also reduce the dosage of cycle 2 of chemotherapy to 40 mg/m of doxorubicin and 400 mg/m of Cytoxan 4. Neuropathy symptoms: We'll need to monitor this closely.   Return to clinic in one week for cycle 2  I spent 25 minutes talking to the patient of which more than half was spent in counseling and coordination of care.  No orders of the defined types were placed in this encounter.  The patient has a good understanding of the overall plan. she agrees with it. she will call with any problems that may develop before the next visit here.   Rulon Eisenmenger, MD 01/08/17

## 2017-01-15 ENCOUNTER — Ambulatory Visit (HOSPITAL_BASED_OUTPATIENT_CLINIC_OR_DEPARTMENT_OTHER): Payer: Medicare HMO | Admitting: Hematology and Oncology

## 2017-01-15 ENCOUNTER — Other Ambulatory Visit (HOSPITAL_BASED_OUTPATIENT_CLINIC_OR_DEPARTMENT_OTHER): Payer: Medicare HMO

## 2017-01-15 ENCOUNTER — Ambulatory Visit (HOSPITAL_BASED_OUTPATIENT_CLINIC_OR_DEPARTMENT_OTHER): Payer: Medicare HMO

## 2017-01-15 ENCOUNTER — Encounter: Payer: Self-pay | Admitting: *Deleted

## 2017-01-15 ENCOUNTER — Ambulatory Visit: Payer: Medicare HMO

## 2017-01-15 VITALS — BP 139/58 | HR 80 | Resp 18

## 2017-01-15 DIAGNOSIS — Z95828 Presence of other vascular implants and grafts: Secondary | ICD-10-CM

## 2017-01-15 DIAGNOSIS — Z5111 Encounter for antineoplastic chemotherapy: Secondary | ICD-10-CM | POA: Diagnosis not present

## 2017-01-15 DIAGNOSIS — Z171 Estrogen receptor negative status [ER-]: Secondary | ICD-10-CM | POA: Diagnosis not present

## 2017-01-15 DIAGNOSIS — Z170 Estrogen receptor positive status [ER+]: Secondary | ICD-10-CM

## 2017-01-15 DIAGNOSIS — C50212 Malignant neoplasm of upper-inner quadrant of left female breast: Secondary | ICD-10-CM

## 2017-01-15 DIAGNOSIS — R110 Nausea: Secondary | ICD-10-CM

## 2017-01-15 DIAGNOSIS — D701 Agranulocytosis secondary to cancer chemotherapy: Secondary | ICD-10-CM | POA: Diagnosis not present

## 2017-01-15 DIAGNOSIS — C50211 Malignant neoplasm of upper-inner quadrant of right female breast: Secondary | ICD-10-CM

## 2017-01-15 LAB — CBC WITH DIFFERENTIAL/PLATELET
BASO%: 0.5 % (ref 0.0–2.0)
BASOS ABS: 0.1 10*3/uL (ref 0.0–0.1)
EOS ABS: 0 10*3/uL (ref 0.0–0.5)
EOS%: 0 % (ref 0.0–7.0)
HCT: 36.7 % (ref 34.8–46.6)
HGB: 11.5 g/dL — ABNORMAL LOW (ref 11.6–15.9)
LYMPH#: 2.2 10*3/uL (ref 0.9–3.3)
LYMPH%: 7.1 % — AB (ref 14.0–49.7)
MCH: 27.1 pg (ref 25.1–34.0)
MCHC: 31.3 g/dL — AB (ref 31.5–36.0)
MCV: 86.4 fL (ref 79.5–101.0)
MONO#: 2.8 10*3/uL — ABNORMAL HIGH (ref 0.1–0.9)
MONO%: 9 % (ref 0.0–14.0)
NEUT#: 25.7 10*3/uL — ABNORMAL HIGH (ref 1.5–6.5)
NEUT%: 83.4 % — ABNORMAL HIGH (ref 38.4–76.8)
PLATELETS: 275 10*3/uL (ref 145–400)
RBC: 4.25 10*6/uL (ref 3.70–5.45)
RDW: 20.5 % — AB (ref 11.2–14.5)
WBC: 30.8 10*3/uL — ABNORMAL HIGH (ref 3.9–10.3)
nRBC: 1 % — ABNORMAL HIGH (ref 0–0)

## 2017-01-15 LAB — COMPREHENSIVE METABOLIC PANEL
ALT: 90 U/L — ABNORMAL HIGH (ref 0–55)
ANION GAP: 11 meq/L (ref 3–11)
AST: 43 U/L — ABNORMAL HIGH (ref 5–34)
Albumin: 3.9 g/dL (ref 3.5–5.0)
Alkaline Phosphatase: 131 U/L (ref 40–150)
BUN: 29 mg/dL — ABNORMAL HIGH (ref 7.0–26.0)
CHLORIDE: 99 meq/L (ref 98–109)
CO2: 28 meq/L (ref 22–29)
Calcium: 10.5 mg/dL — ABNORMAL HIGH (ref 8.4–10.4)
Creatinine: 0.8 mg/dL (ref 0.6–1.1)
EGFR: 67 mL/min/{1.73_m2} — AB (ref >90)
Glucose: 119 mg/dl (ref 70–140)
POTASSIUM: 4.1 meq/L (ref 3.5–5.1)
Sodium: 139 mEq/L (ref 136–145)
Total Bilirubin: 0.4 mg/dL (ref 0.20–1.20)
Total Protein: 7.5 g/dL (ref 6.4–8.3)

## 2017-01-15 MED ORDER — HEPARIN SOD (PORK) LOCK FLUSH 100 UNIT/ML IV SOLN
500.0000 [IU] | Freq: Once | INTRAVENOUS | Status: AC | PRN
  Administered 2017-01-15: 500 [IU]
  Filled 2017-01-15: qty 5

## 2017-01-15 MED ORDER — SODIUM CHLORIDE 0.9 % IV SOLN
Freq: Once | INTRAVENOUS | Status: AC
  Administered 2017-01-15: 09:00:00 via INTRAVENOUS

## 2017-01-15 MED ORDER — SODIUM CHLORIDE 0.9 % IV SOLN
400.0000 mg/m2 | Freq: Once | INTRAVENOUS | Status: AC
  Administered 2017-01-15: 780 mg via INTRAVENOUS
  Filled 2017-01-15: qty 39

## 2017-01-15 MED ORDER — DOXORUBICIN HCL CHEMO IV INJECTION 2 MG/ML
40.0000 mg/m2 | Freq: Once | INTRAVENOUS | Status: AC
  Administered 2017-01-15: 78 mg via INTRAVENOUS
  Filled 2017-01-15: qty 39

## 2017-01-15 MED ORDER — PALONOSETRON HCL INJECTION 0.25 MG/5ML
INTRAVENOUS | Status: AC
  Filled 2017-01-15: qty 5

## 2017-01-15 MED ORDER — SODIUM CHLORIDE 0.9% FLUSH
10.0000 mL | INTRAVENOUS | Status: DC | PRN
  Administered 2017-01-15: 10 mL
  Filled 2017-01-15: qty 10

## 2017-01-15 MED ORDER — SODIUM CHLORIDE 0.9% FLUSH
10.0000 mL | INTRAVENOUS | Status: DC | PRN
  Administered 2017-01-15: 10 mL via INTRAVENOUS
  Filled 2017-01-15: qty 10

## 2017-01-15 MED ORDER — SODIUM CHLORIDE 0.9 % IV SOLN
Freq: Once | INTRAVENOUS | Status: AC
  Administered 2017-01-15: 10:00:00 via INTRAVENOUS
  Filled 2017-01-15: qty 5

## 2017-01-15 MED ORDER — PALONOSETRON HCL INJECTION 0.25 MG/5ML
0.2500 mg | Freq: Once | INTRAVENOUS | Status: AC
  Administered 2017-01-15: 0.25 mg via INTRAVENOUS

## 2017-01-15 NOTE — Patient Instructions (Signed)
Implanted Port Home Guide An implanted port is a type of central line that is placed under the skin. Central lines are used to provide IV access when treatment or nutrition needs to be given through a person's veins. Implanted ports are used for long-term IV access. An implanted port may be placed because:  You need IV medicine that would be irritating to the small veins in your hands or arms.  You need long-term IV medicines, such as antibiotics.  You need IV nutrition for a long period.  You need frequent blood draws for lab tests.  You need dialysis.  Implanted ports are usually placed in the chest area, but they can also be placed in the upper arm, the abdomen, or the leg. An implanted port has two main parts:  Reservoir. The reservoir is round and will appear as a small, raised area under your skin. The reservoir is the part where a needle is inserted to give medicines or draw blood.  Catheter. The catheter is a thin, flexible tube that extends from the reservoir. The catheter is placed into a large vein. Medicine that is inserted into the reservoir goes into the catheter and then into the vein.  How will I care for my incision site? Do not get the incision site wet. Bathe or shower as directed by your health care provider. How is my port accessed? Special steps must be taken to access the port:  Before the port is accessed, a numbing cream can be placed on the skin. This helps numb the skin over the port site.  Your health care provider uses a sterile technique to access the port. ? Your health care provider must put on a mask and sterile gloves. ? The skin over your port is cleaned carefully with an antiseptic and allowed to dry. ? The port is gently pinched between sterile gloves, and a needle is inserted into the port.  Only "non-coring" port needles should be used to access the port. Once the port is accessed, a blood return should be checked. This helps ensure that the port  is in the vein and is not clogged.  If your port needs to remain accessed for a constant infusion, a clear (transparent) bandage will be placed over the needle site. The bandage and needle will need to be changed every week, or as directed by your health care provider.  Keep the bandage covering the needle clean and dry. Do not get it wet. Follow your health care provider's instructions on how to take a shower or bath while the port is accessed.  If your port does not need to stay accessed, no bandage is needed over the port.  What is flushing? Flushing helps keep the port from getting clogged. Follow your health care provider's instructions on how and when to flush the port. Ports are usually flushed with saline solution or a medicine called heparin. The need for flushing will depend on how the port is used.  If the port is used for intermittent medicines or blood draws, the port will need to be flushed: ? After medicines have been given. ? After blood has been drawn. ? As part of routine maintenance.  If a constant infusion is running, the port may not need to be flushed.  How long will my port stay implanted? The port can stay in for as long as your health care provider thinks it is needed. When it is time for the port to come out, surgery will be   done to remove it. The procedure is similar to the one performed when the port was put in. When should I seek immediate medical care? When you have an implanted port, you should seek immediate medical care if:  You notice a bad smell coming from the incision site.  You have swelling, redness, or drainage at the incision site.  You have more swelling or pain at the port site or the surrounding area.  You have a fever that is not controlled with medicine.  This information is not intended to replace advice given to you by your health care provider. Make sure you discuss any questions you have with your health care provider. Document  Released: 04/01/2005 Document Revised: 09/07/2015 Document Reviewed: 12/07/2012 Elsevier Interactive Patient Education  2017 [employer]  

## 2017-01-15 NOTE — Progress Notes (Signed)
Patient Care Team: [name], MD as PCP - General (Family Medicine) [name], MD as Consulting Physician (General Surgery) Nicholas Lose, MD as Consulting Physician (Hematology and Oncology)  DIAGNOSIS:  Encounter Diagnosis  Name Primary?  . Malignant neoplasm of upper-inner quadrant of left breast in female, estrogen receptor negative ([place])     SUMMARY OF ONCOLOGIC HISTORY:   Malignant neoplasm of upper-inner quadrant of left breast in female, estrogen receptor negative ([place])   09/02/2016 Initial Diagnosis    Bilateral breast cancers: Left breast 11:30 position 6.3 cm with 3 abnormal lymph nodes biopsy of both IDC grade 2 LVI, triple negative, Ki-67 90% T3 N1 stage IIIB; right breast multiple masses 1.4 cm and 1:00, 5 mm 12:30 axilla negative biopsy ILC grade 2, ER 90%, PR 30%, Ki-67 10%, HER-2 negative ratio 1.74, T1 cN0 stage IA      09/25/2016 -  Neo-Adjuvant Chemotherapy    Taxol weekly x 12      10/03/2016 Genetic Testing    Underwent genetic counseling 10/03/2016. Declined genetic testing.      01/03/2017 - 01/05/2017 Hospital Admission    Shortness of breath and wheezing from COPD exacerbation       Malignant neoplasm of upper-inner quadrant of right breast in female, estrogen receptor positive ([place])   09/05/2016 Initial Diagnosis    Malignant neoplasm of upper-inner quadrant of right breast in female, estrogen receptor positive ([place])     10/03/2016 Genetic Testing    Underwent genetic counseling 10/03/2016. Declined genetic testing.       CHIEF COMPLIANT:  Cycle 2 dose dense Adriamycin and Cytoxan  INTERVAL HISTORY: Laurie Collins is a  76 year old with above-mentioned history of  Bilateral breast cancers was currently on neoadjuvant chemotherapy today is cycle 2 of dose dense Adriamycin and Cytoxan. After cycle 1  She was in the hospital for COPD exacerbation.  she tells me that Neulasta caused her significant discomfort. She does not want to receive  Neulasta again. She has mild nausea.  REVIEW OF SYSTEMS:   Constitutional: Denies fevers, chills or abnormal weight loss Eyes: Denies blurriness of vision Ears, nose, mouth, throat, and face: Denies mucositis or sore throat Respiratory: Denies cough, dyspnea or wheezes Cardiovascular: Denies palpitation, chest discomfort Gastrointestinal:  Denies nausea, heartburn or change in bowel habits Skin: Denies abnormal skin rashes Lymphatics: Denies new lymphadenopathy or easy bruising Neurological:Denies numbness, tingling or new weaknesses Behavioral/Psych: Mood is stable, no new changes  Extremities: No lower extremity edema Breast:  denies any pain or lumps or nodules in either breasts All other systems were reviewed with the patient and are negative.  I have reviewed the past medical history, past surgical history, social history and family history with the patient and they are unchanged from previous note.  ALLERGIES:  is allergic to doxycycline and penicillins.  MEDICATIONS:  Current Outpatient Prescriptions  Medication Sig Dispense Refill  . albuterol (PROVENTIL HFA;VENTOLIN HFA) 108 (90 Base) MCG/ACT inhaler Inhale 2 puffs into the lungs every 6 (six) hours as needed for wheezing or shortness of breath. 1 Inhaler 2  . amLODipine (NORVASC) 10 MG tablet Take 10 mg by mouth daily.    Marland Kitchen aspirin 81 MG chewable tablet Chew 81 mg by mouth daily.    . Cholecalciferol (VITAMIN D) 2000 units tablet Take 2,000 Units by mouth daily.    Marland Kitchen dexamethasone (DECADRON) 4 MG tablet Take 2 tablets (8 mg total) by mouth daily. 60 tablet 1  . glimepiride (AMARYL) 1 MG tablet  Take 1 mg by mouth daily with breakfast.    . hydrochlorothiazide (MICROZIDE) 12.5 MG capsule Take 12.5 mg by mouth daily.    Marland Kitchen lisinopril (PRINIVIL,ZESTRIL) 20 MG tablet Take 20 mg by mouth daily.    Marland Kitchen loperamide (IMODIUM) 2 MG capsule Take 2 mg by mouth every 4 (four) hours as needed for diarrhea or loose stools.     . metFORMIN  (GLUCOPHAGE) 1000 MG tablet Take 1,000 mg by mouth 2 (two) times daily with a meal.    . metoprolol succinate (TOPROL-XL) 100 MG 24 hr tablet Take 100 mg by mouth daily.    . predniSONE (DELTASONE) 20 MG tablet Take 1 tablet (20 mg total) by mouth daily with breakfast. 4 tablet 0  . simvastatin (ZOCOR) 10 MG tablet Take 10 mg by mouth at bedtime.    Marland Kitchen tiotropium (SPIRIVA HANDIHALER) 18 MCG inhalation capsule Place 1 capsule (18 mcg total) into inhaler and inhale daily. 30 capsule 2  . Triamcinolone Acetonide (TRIAMCINOLONE 0.1 % CREAM : EUCERIN) CREA Apply 1 application topically 2 (two) times daily as needed. 1 each 1   No current facility-administered medications for this visit.     PHYSICAL EXAMINATION: ECOG PERFORMANCE STATUS: 1 - Symptomatic but completely ambulatory  Vitals:   01/15/17 0907  BP: (!) 169/94  Pulse: 84  Resp: 18  Temp: 98.3 F (36.8 C)  SpO2: 100%   Filed Weights   01/15/17 0907  Weight: 182 lb 11.2 oz (82.9 kg)    GENERAL:alert, no distress and comfortable SKIN: skin color, texture, turgor are normal, no rashes or significant lesions EYES: normal, Conjunctiva are pink and non-injected, sclera clear OROPHARYNX:no exudate, no erythema and lips, buccal mucosa, and tongue normal  NECK: supple, thyroid normal size, non-tender, without nodularity LYMPH:  no palpable lymphadenopathy in the cervical, axillary or inguinal LUNGS: clear to auscultation and percussion with normal breathing effort HEART: regular rate & rhythm and no murmurs and no lower extremity edema ABDOMEN:abdomen soft, non-tender and normal bowel sounds MUSCULOSKELETAL:no cyanosis of digits and no clubbing  NEURO: alert & oriented x 3 with fluent speech, no focal motor/sensory deficits EXTREMITIES: No lower extremity edema  LABORATORY DATA:  I have reviewed the data as listed   Chemistry      Component Value Date/Time   NA 139 01/15/2017 0821   K 4.1 01/15/2017 0821   CL 100 (L) 01/03/2017  0912   CO2 28 01/15/2017 0821   BUN 29.0 (H) 01/15/2017 0821   CREATININE 0.8 01/15/2017 0821      Component Value Date/Time   CALCIUM 10.5 (H) 01/15/2017 0821   ALKPHOS 131 01/15/2017 0821   AST 43 (H) 01/15/2017 0821   ALT 90 (H) 01/15/2017 0821   BILITOT 0.40 01/15/2017 0821       Lab Results  Component Value Date   WBC 30.8 (H) 01/15/2017   HGB 11.5 (L) 01/15/2017   HCT 36.7 01/15/2017   MCV 86.4 01/15/2017   PLT 275 01/15/2017   NEUTROABS 25.7 (H) 01/15/2017    ASSESSMENT & PLAN:  Malignant neoplasm of upper-inner quadrant of left breast in female, estrogen receptor negative ([facility]) Bilateral breast cancers:  Left breast11:30 position 6.3 cm with 3 abnormal lymph nodes biopsy of both IDC grade 2 LVI, triple negative, Ki-67 90% T3 N1 stage IIIB; Right breastmultiple masses 1.4 cm and 1:00, 5 mm 12:30 axilla negative biopsy ILC grade 2, ER 90%, PR 30%, Ki-67 10%, HER-2 negative ratio 1.74, T1 cN0 stage IA  Treatment  plan: 1. Neoadjuvant chemotherapy: Weekly Taxol 12 and Adriamycin Cytoxan every 2 weeks 4 2. Followed by surgery 3. Followed by radiation 4. Followed by antiestrogen therapy because of right-sided breast cancer is ER positive ------------------------------------------------------------------- Current treatment: Adriamycin/Cytoxan cycle 2 day  1 Echocardiogram 12/30/2016: EF 65-70% Labs have been reviewed  Chemotherapy toxicities: 1. Neutropenia: Which is expected for day 8 of chemotherapy. Patient did not tolerate Neulasta. we trying to do her chemotherapy without Neulasta from cycle 2 AC 2. hospitalization for COPD exacerbation 3. Neulasta related bone pain as well as abdominal pain and bruising: Patient does not want to take Neulasta any further. We also reduced the dosage of cycle 2 of chemotherapy to 40 mg/m of doxorubicin and 400 mg/m of Cytoxan 4. Neuropathy symptoms: We'll need to monitor this closely.   Return to clinic in  2 Weeks for  cycle 3. Patient understands that without Neulasta her blood counts may not recover in time for cycle 3 and it might require a delay in her treatment   I spent 25 minutes talking to the patient of which more than half was spent in counseling and coordination of care.  No orders of the defined types were placed in this encounter.  The patient has a good understanding of the overall plan. she agrees with it. she will call with any problems that may develop before the next visit here.   Rulon Eisenmenger, MD 01/16/17

## 2017-01-15 NOTE — Patient Instructions (Signed)
[facility] Discharge Instructions for Patients Receiving Chemotherapy  Today you received the following chemotherapy agents: Adriamycin, Cytoxan    To help prevent nausea and vomiting after your treatment, we encourage you to take your nausea medication as prescribed.   If you develop nausea and vomiting that is not controlled by your nausea medication, call the clinic.   BELOW ARE SYMPTOMS THAT SHOULD BE REPORTED IMMEDIATELY:  *FEVER GREATER THAN 100.5 F  *CHILLS WITH OR WITHOUT FEVER  NAUSEA AND VOMITING THAT IS NOT CONTROLLED WITH YOUR NAUSEA MEDICATION  *UNUSUAL SHORTNESS OF BREATH  *UNUSUAL BRUISING OR BLEEDING  TENDERNESS IN MOUTH AND THROAT WITH OR WITHOUT PRESENCE OF ULCERS  *URINARY PROBLEMS  *BOWEL PROBLEMS  UNUSUAL RASH Items with * indicate a potential emergency and should be followed up as soon as possible.  Feel free to call the clinic you have any questions or concerns. The clinic phone number is [phone].  Please show the [place] [facility] at check-in to the Emergency Department and triage nurse.  Doxorubicin injection What is this medicine? DOXORUBICIN (dox oh ROO bi sin) is a chemotherapy drug. It is used to treat many kinds of cancer like leukemia, lymphoma, neuroblastoma, sarcoma, and Wilms' tumor. It is also used to treat bladder cancer, breast cancer, lung cancer, ovarian cancer, stomach cancer, and thyroid cancer. This medicine may be used for other purposes; ask your health care provider or pharmacist if you have questions. COMMON BRAND NAME(S): Adriamycin, Adriamycin PFS, Adriamycin RDF, Rubex What should I tell my health care provider before I take this medicine? They need to know if you have any of these conditions: -heart disease -history of low blood counts caused by a medicine -liver disease -recent or ongoing radiation therapy -an unusual or allergic reaction to doxorubicin, other chemotherapy agents, other medicines,  foods, dyes, or preservatives -pregnant or trying to get pregnant -breast-feeding How should I use this medicine? This drug is given as an infusion into a vein. It is administered in a hospital or clinic by a specially trained health care professional. If you have pain, swelling, burning or any unusual feeling around the site of your injection, tell your health care professional right away. Talk to your pediatrician regarding the use of this medicine in children. Special care may be needed. Overdosage: If you think you have taken too much of this medicine contact a poison control center or emergency room at once. NOTE: This medicine is only for you. Do not share this medicine with others. What if I miss a dose? It is important not to miss your dose. Call your doctor or health care professional if you are unable to keep an appointment. What may interact with this medicine? This medicine may interact with the following medications: -6-mercaptopurine -paclitaxel -phenytoin -St. John's Wort -trastuzumab -verapamil This list may not describe all possible interactions. Give your health care provider a list of all the medicines, herbs, non-prescription drugs, or dietary supplements you use. Also tell them if you smoke, drink alcohol, or use illegal drugs. Some items may interact with your medicine. What should I watch for while using this medicine? This drug may make you feel generally unwell. This is not uncommon, as chemotherapy can affect healthy cells as well as cancer cells. Report any side effects. Continue your course of treatment even though you feel ill unless your doctor tells you to stop. There is a maximum amount of this medicine you should receive throughout your life. The amount depends on  the medical condition being treated and your overall health. Your doctor will watch how much of this medicine you receive in your lifetime. Tell your doctor if you have taken this medicine before. You  may need blood work done while you are taking this medicine. Your urine may turn red for a few days after your dose. This is not blood. If your urine is dark or brown, call your doctor. In some cases, you may be given additional medicines to help with side effects. Follow all directions for their use. Call your doctor or health care professional for advice if you get a fever, chills or sore throat, or other symptoms of a cold or flu. Do not treat yourself. This drug decreases your body's ability to fight infections. Try to avoid being around people who are sick. This medicine may increase your risk to bruise or bleed. Call your doctor or health care professional if you notice any unusual bleeding. Talk to your doctor about your risk of cancer. You may be more at risk for certain types of cancers if you take this medicine. Do not become pregnant while taking this medicine or for 6 months after stopping it. Women should inform their doctor if they wish to become pregnant or think they might be pregnant. Men should not father a child while taking this medicine and for 6 months after stopping it. There is a potential for serious side effects to an unborn child. Talk to your health care professional or pharmacist for more information. Do not breast-feed an infant while taking this medicine. This medicine has caused ovarian failure in some women and reduced sperm counts in some men This medicine may interfere with the ability to have a child. Talk with your doctor or health care professional if you are concerned about your fertility. What side effects may I notice from receiving this medicine? Side effects that you should report to your doctor or health care professional as soon as possible: -allergic reactions like skin rash, itching or hives, swelling of the face, lips, or tongue -breathing problems -chest pain -fast or irregular heartbeat -low blood counts - this medicine may decrease the number of white  blood cells, red blood cells and platelets. You may be at increased risk for infections and bleeding. -pain, redness, or irritation at site where injected -signs of infection - fever or chills, cough, sore throat, pain or difficulty passing urine -signs of decreased platelets or bleeding - bruising, pinpoint red spots on the skin, black, tarry stools, blood in the urine -swelling of the ankles, feet, hands -tiredness -weakness Side effects that usually do not require medical attention (report to your doctor or health care professional if they continue or are bothersome): -diarrhea -hair loss -mouth sores -nail discoloration or damage -nausea -red colored urine -vomiting This list may not describe all possible side effects. Call your doctor for medical advice about side effects. You may report side effects to FDA at 1-800-FDA-1088. Where should I keep my medicine? This drug is given in a hospital or clinic and will not be stored at home. NOTE: This sheet is a summary. It may not cover all possible information. If you have questions about this medicine, talk to your doctor, pharmacist, or health care provider.  2018 Elsevier/Gold Standard (2015-05-29 11:28:51) Cyclophosphamide injection What is this medicine? CYCLOPHOSPHAMIDE (sye kloe FOSS fa mide) is a chemotherapy drug. It slows the growth of cancer cells. This medicine is used to treat many types of cancer like lymphoma, myeloma,  leukemia, breast cancer, and ovarian cancer, to name a few. This medicine may be used for other purposes; ask your health care provider or pharmacist if you have questions. COMMON BRAND NAME(S): Cytoxan, Neosar What should I tell my health care provider before I take this medicine? They need to know if you have any of these conditions: -blood disorders -history of other chemotherapy -infection -kidney disease -liver disease -recent or ongoing radiation therapy -tumors in the bone marrow -an unusual or  allergic reaction to cyclophosphamide, other chemotherapy, other medicines, foods, dyes, or preservatives -pregnant or trying to get pregnant -breast-feeding How should I use this medicine? This drug is usually given as an injection into a vein or muscle or by infusion into a vein. It is administered in a hospital or clinic by a specially trained health care professional. Talk to your pediatrician regarding the use of this medicine in children. Special care may be needed. Overdosage: If you think you have taken too much of this medicine contact a poison control center or emergency room at once. NOTE: This medicine is only for you. Do not share this medicine with others. What if I miss a dose? It is important not to miss your dose. Call your doctor or health care professional if you are unable to keep an appointment. What may interact with this medicine? This medicine may interact with the following medications: -amiodarone -amphotericin B -azathioprine -certain antiviral medicines for HIV or AIDS such as protease inhibitors (e.g., indinavir, ritonavir) and zidovudine -certain blood pressure medications such as benazepril, captopril, enalapril, fosinopril, lisinopril, moexipril, monopril, perindopril, quinapril, ramipril, trandolapril -certain cancer medications such as anthracyclines (e.g., daunorubicin, doxorubicin), busulfan, cytarabine, paclitaxel, pentostatin, tamoxifen, trastuzumab -certain diuretics such as chlorothiazide, chlorthalidone, hydrochlorothiazide, indapamide, metolazone -certain medicines that treat or prevent blood clots like warfarin -certain muscle relaxants such as succinylcholine -cyclosporine -etanercept -indomethacin -medicines to increase blood counts like filgrastim, pegfilgrastim, sargramostim -medicines used as general anesthesia -metronidazole -natalizumab This list may not describe all possible interactions. Give your health care provider a list of all the  medicines, herbs, non-prescription drugs, or dietary supplements you use. Also tell them if you smoke, drink alcohol, or use illegal drugs. Some items may interact with your medicine. What should I watch for while using this medicine? Visit your doctor for checks on your progress. This drug may make you feel generally unwell. This is not uncommon, as chemotherapy can affect healthy cells as well as cancer cells. Report any side effects. Continue your course of treatment even though you feel ill unless your doctor tells you to stop. Drink water or other fluids as directed. Urinate often, even at night. In some cases, you may be given additional medicines to help with side effects. Follow all directions for their use. Call your doctor or health care professional for advice if you get a fever, chills or sore throat, or other symptoms of a cold or flu. Do not treat yourself. This drug decreases your body's ability to fight infections. Try to avoid being around people who are sick. This medicine may increase your risk to bruise or bleed. Call your doctor or health care professional if you notice any unusual bleeding. Be careful brushing and flossing your teeth or using a toothpick because you may get an infection or bleed more easily. If you have any dental work done, tell your dentist you are receiving this medicine. You may get drowsy or dizzy. Do not drive, use machinery, or do anything that needs mental alertness until  you know how this medicine affects you. Do not become pregnant while taking this medicine or for 1 year after stopping it. Women should inform their doctor if they wish to become pregnant or think they might be pregnant. Men should not father a child while taking this medicine and for 4 months after stopping it. There is a potential for serious side effects to an unborn child. Talk to your health care professional or pharmacist for more information. Do not breast-feed an infant while taking  this medicine. This medicine may interfere with the ability to have a child. This medicine has caused ovarian failure in some women. This medicine has caused reduced sperm counts in some men. You should talk with your doctor or health care professional if you are concerned about your fertility. If you are going to have surgery, tell your doctor or health care professional that you have taken this medicine. What side effects may I notice from receiving this medicine? Side effects that you should report to your doctor or health care professional as soon as possible: -allergic reactions like skin rash, itching or hives, swelling of the face, lips, or tongue -low blood counts - this medicine may decrease the number of white blood cells, red blood cells and platelets. You may be at increased risk for infections and bleeding. -signs of infection - fever or chills, cough, sore throat, pain or difficulty passing urine -signs of decreased platelets or bleeding - bruising, pinpoint red spots on the skin, black, tarry stools, blood in the urine -signs of decreased red blood cells - unusually weak or tired, fainting spells, lightheadedness -breathing problems -dark urine -dizziness -palpitations -swelling of the ankles, feet, hands -trouble passing urine or change in the amount of urine -weight gain -yellowing of the eyes or skin Side effects that usually do not require medical attention (report to your doctor or health care professional if they continue or are bothersome): -changes in nail or skin color -hair loss -missed menstrual periods -mouth sores -nausea, vomiting This list may not describe all possible side effects. Call your doctor for medical advice about side effects. You may report side effects to FDA at 1-800-FDA-1088. Where should I keep my medicine? This drug is given in a hospital or clinic and will not be stored at home. NOTE: This sheet is a summary. It may not cover all possible  information. If you have questions about this medicine, talk to your doctor, pharmacist, or health care provider.  2018 Elsevier/Gold Standard (2012-02-14 16:22:58) Pegfilgrastim injection What is this medicine? PEGFILGRASTIM (PEG fil gra stim) is a long-acting granulocyte colony-stimulating factor that stimulates the growth of neutrophils, a type of white blood cell important in the body's fight against infection. It is used to reduce the incidence of fever and infection in patients with certain types of cancer who are receiving chemotherapy that affects the bone marrow, and to increase survival after being exposed to high doses of radiation. This medicine may be used for other purposes; ask your health care provider or pharmacist if you have questions. COMMON BRAND NAME(S): Neulasta What should I tell my health care provider before I take this medicine? They need to know if you have any of these conditions: -kidney disease -latex allergy -ongoing radiation therapy -sickle cell disease -skin reactions to acrylic adhesives (On-Body Injector only) -an unusual or allergic reaction to pegfilgrastim, filgrastim, other medicines, foods, dyes, or preservatives -pregnant or trying to get pregnant -breast-feeding How should I use this medicine? This medicine is  for injection under the skin. If you get this medicine at home, you will be taught how to prepare and give the pre-filled syringe or how to use the On-body Injector. Refer to the patient Instructions for Use for detailed instructions. Use exactly as directed. Tell your healthcare provider immediately if you suspect that the On-body Injector may not have performed as intended or if you suspect the use of the On-body Injector resulted in a missed or partial dose. It is important that you put your used needles and syringes in a special sharps container. Do not put them in a trash can. If you do not have a sharps container, call your pharmacist or  healthcare provider to get one. Talk to your pediatrician regarding the use of this medicine in children. While this drug may be prescribed for selected conditions, precautions do apply. Overdosage: If you think you have taken too much of this medicine contact a poison control center or emergency room at once. NOTE: This medicine is only for you. Do not share this medicine with others. What if I miss a dose? It is important not to miss your dose. Call your doctor or health care professional if you miss your dose. If you miss a dose due to an On-body Injector failure or leakage, a new dose should be administered as soon as possible using a single prefilled syringe for manual use. What may interact with this medicine? Interactions have not been studied. Give your health care provider a list of all the medicines, herbs, non-prescription drugs, or dietary supplements you use. Also tell them if you smoke, drink alcohol, or use illegal drugs. Some items may interact with your medicine. This list may not describe all possible interactions. Give your health care provider a list of all the medicines, herbs, non-prescription drugs, or dietary supplements you use. Also tell them if you smoke, drink alcohol, or use illegal drugs. Some items may interact with your medicine. What should I watch for while using this medicine? You may need blood work done while you are taking this medicine. If you are going to need a MRI, CT scan, or other procedure, tell your doctor that you are using this medicine (On-Body Injector only). What side effects may I notice from receiving this medicine? Side effects that you should report to your doctor or health care professional as soon as possible: -allergic reactions like skin rash, itching or hives, swelling of the face, lips, or tongue -dizziness -fever -pain, redness, or irritation at site where injected -pinpoint red spots on the skin -red or dark-brown urine -shortness of  breath or breathing problems -stomach or side pain, or pain at the shoulder -swelling -tiredness -trouble passing urine or change in the amount of urine Side effects that usually do not require medical attention (report to your doctor or health care professional if they continue or are bothersome): -bone pain -muscle pain This list may not describe all possible side effects. Call your doctor for medical advice about side effects. You may report side effects to FDA at 1-800-FDA-1088. Where should I keep my medicine? Keep out of the reach of children. Store pre-filled syringes in a refrigerator between 2 and 8 degrees C (36 and 46 degrees F). Do not freeze. Keep in carton to protect from light. Throw away this medicine if it is left out of the refrigerator for more than 48 hours. Throw away any unused medicine after the expiration date. NOTE: This sheet is a summary. It may not cover  all possible information. If you have questions about this medicine, talk to your doctor, pharmacist, or health care provider.  2018 Elsevier/Gold Standard (2016-03-28 12:58:03)

## 2017-01-15 NOTE — Progress Notes (Signed)
Per Dr [name] ok to tx w/ ALT 90 today

## 2017-01-15 NOTE — Assessment & Plan Note (Signed)
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 Neoadjuvant chemotherapy: Weekly Taxol 12 and Adriamycin Cytoxan every 2 weeks 4 2. Followed by surgery 3. Followed by radiation 4. Followed by antiestrogen therapy because of right-sided breast cancer is ER positive ------------------------------------------------------------------- Current treatment: Adriamycin/Cytoxan cycle 2 day 1 Echocardiogram 12/30/2016: EF 65-70% Labs have been reviewed  Chemotherapy toxicities: 1. Neutropenia: Which is expected for day 8 of chemotherapy. Patient did not tolerate Neulasta. we trying to do her chemotherapy without Neulasta from cycle 2 AC 2. hospitalization for COPD exacerbation 3. Neulasta related bone pain as well as abdominal pain and bruising: Patient does not want to take Neulasta any further. We also reduced the dosage of cycle 2 of chemotherapy to 40 mg/m of doxorubicin and 400 mg/m of Cytoxan 4. Neuropathy symptoms: We'll need to monitor this closely.   Return to clinic in  2 Weeks for cycle 3. Patient understands that without Neulasta her blood counts may not recover in time for cycle 3 and it might require a delay in her treatment

## 2017-01-16 ENCOUNTER — Encounter: Payer: Self-pay | Admitting: *Deleted

## 2017-01-17 DIAGNOSIS — J441 Chronic obstructive pulmonary disease with (acute) exacerbation: Secondary | ICD-10-CM | POA: Diagnosis not present

## 2017-01-17 DIAGNOSIS — C50919 Malignant neoplasm of unspecified site of unspecified female breast: Secondary | ICD-10-CM | POA: Diagnosis not present

## 2017-01-17 DIAGNOSIS — Q256 Stenosis of pulmonary artery: Secondary | ICD-10-CM | POA: Diagnosis not present

## 2017-01-17 DIAGNOSIS — I517 Cardiomegaly: Secondary | ICD-10-CM | POA: Diagnosis not present

## 2017-01-22 ENCOUNTER — Other Ambulatory Visit: Payer: Medicare HMO

## 2017-01-22 ENCOUNTER — Ambulatory Visit: Payer: Medicare HMO

## 2017-01-29 ENCOUNTER — Other Ambulatory Visit (HOSPITAL_BASED_OUTPATIENT_CLINIC_OR_DEPARTMENT_OTHER): Payer: Medicare HMO

## 2017-01-29 ENCOUNTER — Ambulatory Visit: Payer: Medicare HMO

## 2017-01-29 ENCOUNTER — Encounter: Payer: Self-pay | Admitting: Adult Health

## 2017-01-29 ENCOUNTER — Ambulatory Visit (HOSPITAL_BASED_OUTPATIENT_CLINIC_OR_DEPARTMENT_OTHER): Payer: Medicare HMO

## 2017-01-29 ENCOUNTER — Ambulatory Visit (HOSPITAL_BASED_OUTPATIENT_CLINIC_OR_DEPARTMENT_OTHER): Payer: Medicare HMO | Admitting: Adult Health

## 2017-01-29 VITALS — BP 129/60 | HR 102 | Temp 98.8°F | Resp 20 | Ht 63.0 in | Wt 177.9 lb

## 2017-01-29 DIAGNOSIS — Z171 Estrogen receptor negative status [ER-]: Principal | ICD-10-CM

## 2017-01-29 DIAGNOSIS — C50211 Malignant neoplasm of upper-inner quadrant of right female breast: Secondary | ICD-10-CM

## 2017-01-29 DIAGNOSIS — Z5111 Encounter for antineoplastic chemotherapy: Secondary | ICD-10-CM

## 2017-01-29 DIAGNOSIS — Z170 Estrogen receptor positive status [ER+]: Secondary | ICD-10-CM

## 2017-01-29 DIAGNOSIS — C50212 Malignant neoplasm of upper-inner quadrant of left female breast: Secondary | ICD-10-CM

## 2017-01-29 DIAGNOSIS — J449 Chronic obstructive pulmonary disease, unspecified: Secondary | ICD-10-CM | POA: Diagnosis not present

## 2017-01-29 LAB — CBC WITH DIFFERENTIAL/PLATELET
BASO%: 0.5 % (ref 0.0–2.0)
Basophils Absolute: 0.1 10*3/uL (ref 0.0–0.1)
EOS ABS: 0 10*3/uL (ref 0.0–0.5)
EOS%: 0 % (ref 0.0–7.0)
HCT: 37.5 % (ref 34.8–46.6)
HEMOGLOBIN: 11.9 g/dL (ref 11.6–15.9)
LYMPH#: 1.4 10*3/uL (ref 0.9–3.3)
LYMPH%: 12.4 % — ABNORMAL LOW (ref 14.0–49.7)
MCH: 27.3 pg (ref 25.1–34.0)
MCHC: 31.8 g/dL (ref 31.5–36.0)
MCV: 85.8 fL (ref 79.5–101.0)
MONO#: 1.5 10*3/uL — AB (ref 0.1–0.9)
MONO%: 13.7 % (ref 0.0–14.0)
NEUT%: 73.4 % (ref 38.4–76.8)
NEUTROS ABS: 8.2 10*3/uL — AB (ref 1.5–6.5)
PLATELETS: 380 10*3/uL (ref 145–400)
RBC: 4.37 10*6/uL (ref 3.70–5.45)
RDW: 19.2 % — AB (ref 11.2–14.5)
WBC: 11.1 10*3/uL — AB (ref 3.9–10.3)

## 2017-01-29 LAB — COMPREHENSIVE METABOLIC PANEL
ALBUMIN: 3.5 g/dL (ref 3.5–5.0)
ALK PHOS: 65 U/L (ref 40–150)
ALT: 81 U/L — AB (ref 0–55)
ANION GAP: 12 meq/L — AB (ref 3–11)
AST: 33 U/L (ref 5–34)
BILIRUBIN TOTAL: 0.36 mg/dL (ref 0.20–1.20)
BUN: 39 mg/dL — ABNORMAL HIGH (ref 7.0–26.0)
CO2: 24 mEq/L (ref 22–29)
CREATININE: 0.9 mg/dL (ref 0.6–1.1)
Calcium: 9.9 mg/dL (ref 8.4–10.4)
Chloride: 102 mEq/L (ref 98–109)
GLUCOSE: 124 mg/dL (ref 70–140)
Potassium: 3.8 mEq/L (ref 3.5–5.1)
Sodium: 139 mEq/L (ref 136–145)
TOTAL PROTEIN: 7 g/dL (ref 6.4–8.3)

## 2017-01-29 MED ORDER — PALONOSETRON HCL INJECTION 0.25 MG/5ML
INTRAVENOUS | Status: AC
  Filled 2017-01-29: qty 5

## 2017-01-29 MED ORDER — SODIUM CHLORIDE 0.9 % IV SOLN
Freq: Once | INTRAVENOUS | Status: AC
  Administered 2017-01-29: 16:00:00 via INTRAVENOUS
  Filled 2017-01-29: qty 5

## 2017-01-29 MED ORDER — SODIUM CHLORIDE 0.9 % IV SOLN
Freq: Once | INTRAVENOUS | Status: AC
  Administered 2017-01-29: 15:00:00 via INTRAVENOUS

## 2017-01-29 MED ORDER — SODIUM CHLORIDE 0.9% FLUSH
10.0000 mL | INTRAVENOUS | Status: DC | PRN
  Administered 2017-01-29: 10 mL
  Filled 2017-01-29: qty 10

## 2017-01-29 MED ORDER — HEPARIN SOD (PORK) LOCK FLUSH 100 UNIT/ML IV SOLN
500.0000 [IU] | Freq: Once | INTRAVENOUS | Status: AC | PRN
  Administered 2017-01-29: 500 [IU]
  Filled 2017-01-29: qty 5

## 2017-01-29 MED ORDER — CYCLOPHOSPHAMIDE CHEMO INJECTION 1 GM
400.0000 mg/m2 | Freq: Once | INTRAMUSCULAR | Status: AC
  Administered 2017-01-29: 780 mg via INTRAVENOUS
  Filled 2017-01-29: qty 39

## 2017-01-29 MED ORDER — PALONOSETRON HCL INJECTION 0.25 MG/5ML
0.2500 mg | Freq: Once | INTRAVENOUS | Status: AC
  Administered 2017-01-29: 0.25 mg via INTRAVENOUS

## 2017-01-29 MED ORDER — DOXORUBICIN HCL CHEMO IV INJECTION 2 MG/ML
40.0000 mg/m2 | Freq: Once | INTRAVENOUS | Status: AC
  Administered 2017-01-29: 78 mg via INTRAVENOUS
  Filled 2017-01-29: qty 39

## 2017-01-29 NOTE — Progress Notes (Signed)
[facility] Follow up:    Laurie Jordan, MD [address] [address]   DIAGNOSIS: Cancer Staging Malignant neoplasm of upper-inner quadrant of left breast in female, estrogen receptor negative ([place]) Staging form: Breast, AJCC 8th Edition - Clinical stage from 09/11/2016: Stage IIIB (cT2, cN1, cM0, G3, ER: Negative, PR: Negative, HER2: Negative) - Unsigned Staging comments: Staged at breast conference on 5.30.18  Malignant neoplasm of upper-inner quadrant of right breast in female, estrogen receptor positive ([place]) Staging form: Breast, AJCC 8th Edition - Clinical stage from 09/11/2016: Stage IA (cT1c, cN0, cM0, G2, ER: Positive, PR: Positive, HER2: Negative) - Unsigned Staging comments: Staged at breast conference on 5.30.18   SUMMARY OF ONCOLOGIC HISTORY:   Malignant neoplasm of upper-inner quadrant of left breast in female, estrogen receptor negative ([place])   09/02/2016 Initial Diagnosis    Bilateral breast cancers: Left breast 11:30 position 6.3 cm with 3 abnormal lymph nodes biopsy of both IDC grade 2 LVI, triple negative, Ki-67 90% T3 N1 stage IIIB; right breast multiple masses 1.4 cm and 1:00, 5 mm 12:30 axilla negative biopsy ILC grade 2, ER 90%, PR 30%, Ki-67 10%, HER-2 negative ratio 1.74, T1 cN0 stage IA      09/25/2016 -  Neo-Adjuvant Chemotherapy    Taxol weekly x 12, followed by Doxorubicin and Cyclophosphamide x 4      10/03/2016 Genetic Testing    Underwent genetic counseling 10/03/2016. Declined genetic testing.      01/03/2017 - 01/05/2017 Hospital Admission    Shortness of breath and wheezing from COPD exacerbation       Malignant neoplasm of upper-inner quadrant of right breast in female, estrogen receptor positive ([place])   09/05/2016 Initial Diagnosis    Malignant neoplasm of upper-inner quadrant of right breast in female, estrogen receptor positive ([place])     10/03/2016 Genetic Testing    Underwent genetic  counseling 10/03/2016. Declined genetic testing.       CURRENT THERAPY: Doxorubicin and Cyclophosphamide  INTERVAL HISTORY: Laurie Collins 76 y.o. female returns for evaluation prior to receiving chemotherapy with Doxorubicin Cyclophosphamide cycle 3 day 1.  She did not receive Neulasta after cycle 2 because she did not tolerate her the injection very well.  Her chemotherapy doses were reduced due to this.    Her labs today are normal.  She is doing well.  She denies any issues.  She is getting an assistive device to help her walk.  She says that she has needed an assistive device since prior to starting chemotherapy.    Patient Active Problem List   Diagnosis Date Noted  . COPD with acute exacerbation ([place]) 01/03/2017  . COPD exacerbation ([place]) 01/03/2017  . Genetic testing 10/04/2016  . Malignant neoplasm of upper-inner quadrant of left breast in female, estrogen receptor negative ([place]) 09/05/2016  . Malignant neoplasm of upper-inner quadrant of right breast in female, estrogen receptor positive ([place]) 09/05/2016    is allergic to doxycycline and penicillins.  MEDICAL HISTORY: Past Medical History:  Diagnosis Date  . Anxiety   . Cancer ([place])    breast  . COPD (chronic obstructive pulmonary disease) ([place])   . Depression   . Diabetes mellitus without complication ([place])   . Dyspnea   . Hypertension   . PONV (postoperative nausea and vomiting)    very,very,very sick from anesthesia  . Tuberculosis    1962    SURGICAL HISTORY: Past Surgical History:  Procedure Laterality Date  .  BREAST SURGERY     5 breast lumpectomy  . PORTACATH PLACEMENT Right 09/24/2016   Procedure: INSERTION PORT-A-CATH;  Surgeon: [name], MD;  Location: [facility];  Service: General;  Laterality: Right;    SOCIAL HISTORY: Social History   Social History  . Marital status: Widowed    Spouse name: N/A  . Number of children: N/A  . Years of education: N/A   Occupational History  . Not on  file.   Social History Main Topics  . Smoking status: Former Smoker    Packs/day: 2.00    Years: 25.00    Types: Cigarettes    Quit date: 2013  . Smokeless tobacco: Never Used  . Alcohol use No  . Drug use: No  . Sexual activity: Not on file   Other Topics Concern  . Not on file   Social History Narrative  . No narrative on file    FAMILY HISTORY: Family History  Problem Relation Age of Onset  . Stomach cancer Mother 28       d.67 from metastatic disease  . Cancer Maternal Aunt        Unspecified type  . Cancer Maternal Uncle        Unspecified type    Review of Systems  Constitutional: Negative for appetite change, chills, fatigue, fever and unexpected weight change.  HENT:   Negative for hearing loss and lump/mass.   Eyes: Negative for eye problems and icterus.  Respiratory: Negative for chest tightness, cough and shortness of breath.   Cardiovascular: Negative for chest pain, leg swelling and palpitations.  Gastrointestinal: Negative for abdominal distention, abdominal pain, constipation, diarrhea, nausea and vomiting.  Endocrine: Negative for hot flashes.  Musculoskeletal: Negative for arthralgias.  Skin: Negative for itching and rash.  Neurological: Negative for dizziness, extremity weakness, headaches and numbness.  Hematological: Negative for adenopathy. Does not bruise/bleed easily.  Psychiatric/Behavioral: Negative for depression. The patient is not nervous/anxious.       PHYSICAL EXAMINATION  ECOG PERFORMANCE STATUS: 2 - Symptomatic, <50% confined to bed  Vitals:   01/29/17 1331  BP: 129/60  Pulse: (!) 102  Resp: 20  Temp: 98.8 F (37.1 C)  SpO2: 100%    Physical Exam  Constitutional: She is oriented to person, place, and time and well-developed, well-nourished, and in no distress.  HENT:  Head: Normocephalic and atraumatic.  Mouth/Throat: Oropharynx is clear and moist. No oropharyngeal exudate.  Eyes: Pupils are equal, round, and reactive  to light. No scleral icterus.  Neck: Neck supple.  Cardiovascular: Normal rate, regular rhythm and normal heart sounds.   Pulmonary/Chest: Effort normal and breath sounds normal.  Abdominal: Soft. Bowel sounds are normal. She exhibits no distension. There is no tenderness.  Musculoskeletal: She exhibits no edema.  Lymphadenopathy:    She has no cervical adenopathy.  Neurological: She is alert and oriented to person, place, and time.  Skin: Skin is warm and dry.  Psychiatric: Mood and affect normal.    LABORATORY DATA:  CBC    Component Value Date/Time   WBC 11.1 (H) 01/29/2017 1229   WBC 49.6 (H) 01/03/2017 0343   RBC 4.37 01/29/2017 1229   RBC 3.67 (L) 01/03/2017 0343   HGB 11.9 01/29/2017 1229   HCT 37.5 01/29/2017 1229   PLT 380 01/29/2017 1229   MCV 85.8 01/29/2017 1229   MCH 27.3 01/29/2017 1229   MCH 26.7 01/03/2017 0343   MCHC 31.8 01/29/2017 1229   MCHC 31.3 01/03/2017 0343   RDW  19.2 (H) 01/29/2017 1229   LYMPHSABS 1.4 01/29/2017 1229   MONOABS 1.5 (H) 01/29/2017 1229   EOSABS 0.0 01/29/2017 1229   BASOSABS 0.1 01/29/2017 1229    CMP     Component Value Date/Time   NA 139 01/15/2017 0821   K 4.1 01/15/2017 0821   CL 100 (L) 01/03/2017 0912   CO2 28 01/15/2017 0821   GLUCOSE 119 01/15/2017 0821   BUN 29.0 (H) 01/15/2017 0821   CREATININE 0.8 01/15/2017 0821   CALCIUM 10.5 (H) 01/15/2017 0821   PROT 7.5 01/15/2017 0821   ALBUMIN 3.9 01/15/2017 0821   AST 43 (H) 01/15/2017 0821   ALT 90 (H) 01/15/2017 0821   ALKPHOS 131 01/15/2017 0821   BILITOT 0.40 01/15/2017 0821   GFRNONAA 47 (L) 01/03/2017 0912   GFRAA 54 (L) 01/03/2017 0912            ASSESSMENT and PLAN:   Malignant neoplasm of upper-inner quadrant of left breast in female, estrogen receptor negative ([place]) Bilateral breast cancers:  Left breast11:30 position 6.3 cm with 3 abnormal lymph nodes biopsy of both IDC grade 2 LVI, triple negative, Ki-67 90% T3 N1 stage IIIB; Right  breastmultiple masses 1.4 cm and 1:00, 5 mm 12:30 axilla negative biopsy ILC grade 2, ER 90%, PR 30%, Ki-67 10%, HER-2 negative ratio 1.74, T1 cN0 stage IA  Treatment plan: 1. Neoadjuvant chemotherapy: Weekly Taxol 12 and Adriamycin Cytoxan every 2 weeks 4 2. Followed by surgery 3. Followed by radiation 4. Followed by antiestrogen therapy because of right-sided breast cancer is ER positive ------------------------------------------------------------------- Current treatment: Adriamycin/Cytoxan cycle 3 day 1 Echocardiogram 12/30/2016: EF 65-70% Labs have been reviewed  Chemotherapy toxicities: 1. Neutropenia:  She cannot tolerate Neulasta.  Her AC was dose reduced.  We will try to continue chemo every two weeks without neulasta.  But she may need a delay with cycle 4 2. hospitalization for COPD exacerbation 3. Neuropathy symptoms: Stable.   RTC in two weeks for labs, f/u, and cycle 4 of AC.  I did go ahead and order her breast MRI to be scheduled on 02/19/2017 (in case she requires delay in chemo that week).     All questions were answered. The patient knows to call the clinic with any problems, questions or concerns. We can certainly see the patient much sooner if necessary.  A total of (30) minutes of face-to-face time was spent with this patient with greater than 50% of that time in counseling and care-coordination.  This note was electronically signed. Scot Dock, NP 01/29/2017

## 2017-01-29 NOTE — Assessment & Plan Note (Addendum)
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 Neoadjuvant chemotherapy: Weekly Taxol 12 and Adriamycin Cytoxan every 2 weeks 4 2. Followed by surgery 3. Followed by radiation 4. Followed by antiestrogen therapy because of right-sided breast cancer is ER positive ------------------------------------------------------------------- Current treatment: Adriamycin/Cytoxan cycle 3 day 1 Echocardiogram 12/30/2016: EF 65-70% Labs have been reviewed  Chemotherapy toxicities: 1. Neutropenia: She cannot tolerate Neulasta.  Her AC was dose reduced.  We will try to continue chemo every two weeks without neulasta.  But she may need a delay with cycle 4 2. hospitalization for COPD exacerbation 3. Neuropathy symptoms: Stable.   RTC in two weeks for labs, f/u, and cycle 4 of AC.  I did go ahead and order her breast MRI to be scheduled on 02/19/2017 (in case she requires delay in chemo that week).

## 2017-01-29 NOTE — Progress Notes (Signed)
Excellent blood return before, during and after Adriamycin IVP.  Patient tolerated well.

## 2017-01-31 ENCOUNTER — Telehealth: Payer: Self-pay

## 2017-01-31 NOTE — Telephone Encounter (Signed)
LPN called central scheduling and made appt for MRI for patient on Nov. 7 2018 at 12 pm.  Spoke with patient on the phone and she is aware.

## 2017-02-10 ENCOUNTER — Telehealth: Payer: Self-pay | Admitting: *Deleted

## 2017-02-10 NOTE — Telephone Encounter (Signed)
Voicemail; "I have an appointment there on Wednesday.  I want to know what time it is."  No further information.  [employer] (option # 9) phone information reveals call originated from [phone].   Returned call.  Patient confirms calling [facility] for appointment information.  Scheduled 02-12-2017 beginning at 1015 am for lab, flush, F/U, infusion.  Provided appointment information.  Requested arrival thirty minutes early for registration.  "I will arrange transportation to arrive at 0945."  Denies further questions or needs.

## 2017-02-12 ENCOUNTER — Other Ambulatory Visit (HOSPITAL_BASED_OUTPATIENT_CLINIC_OR_DEPARTMENT_OTHER): Payer: Medicare HMO

## 2017-02-12 ENCOUNTER — Ambulatory Visit: Payer: Medicare HMO

## 2017-02-12 ENCOUNTER — Ambulatory Visit (HOSPITAL_BASED_OUTPATIENT_CLINIC_OR_DEPARTMENT_OTHER): Payer: Medicare HMO

## 2017-02-12 ENCOUNTER — Ambulatory Visit (HOSPITAL_BASED_OUTPATIENT_CLINIC_OR_DEPARTMENT_OTHER): Payer: Medicare HMO | Admitting: Adult Health

## 2017-02-12 VITALS — BP 116/48 | HR 68

## 2017-02-12 VITALS — BP 104/88 | HR 78 | Temp 97.5°F | Resp 16 | Ht 63.0 in | Wt 167.7 lb

## 2017-02-12 DIAGNOSIS — C50212 Malignant neoplasm of upper-inner quadrant of left female breast: Secondary | ICD-10-CM

## 2017-02-12 DIAGNOSIS — Z170 Estrogen receptor positive status [ER+]: Secondary | ICD-10-CM | POA: Diagnosis not present

## 2017-02-12 DIAGNOSIS — R634 Abnormal weight loss: Secondary | ICD-10-CM | POA: Diagnosis not present

## 2017-02-12 DIAGNOSIS — M79605 Pain in left leg: Secondary | ICD-10-CM

## 2017-02-12 DIAGNOSIS — Z171 Estrogen receptor negative status [ER-]: Secondary | ICD-10-CM

## 2017-02-12 DIAGNOSIS — M549 Dorsalgia, unspecified: Secondary | ICD-10-CM | POA: Diagnosis not present

## 2017-02-12 DIAGNOSIS — M546 Pain in thoracic spine: Secondary | ICD-10-CM

## 2017-02-12 DIAGNOSIS — M79604 Pain in right leg: Secondary | ICD-10-CM

## 2017-02-12 DIAGNOSIS — M545 Low back pain, unspecified: Secondary | ICD-10-CM

## 2017-02-12 DIAGNOSIS — B370 Candidal stomatitis: Secondary | ICD-10-CM

## 2017-02-12 DIAGNOSIS — C50211 Malignant neoplasm of upper-inner quadrant of right female breast: Secondary | ICD-10-CM

## 2017-02-12 DIAGNOSIS — Z95828 Presence of other vascular implants and grafts: Secondary | ICD-10-CM

## 2017-02-12 LAB — CBC WITH DIFFERENTIAL/PLATELET
BASO%: 2.7 % — ABNORMAL HIGH (ref 0.0–2.0)
Basophils Absolute: 0.3 10*3/uL — ABNORMAL HIGH (ref 0.0–0.1)
EOS ABS: 0 10*3/uL (ref 0.0–0.5)
EOS%: 0 % (ref 0.0–7.0)
HCT: 36.1 % (ref 34.8–46.6)
HGB: 11.6 g/dL (ref 11.6–15.9)
LYMPH%: 8 % — AB (ref 14.0–49.7)
MCH: 26.8 pg (ref 25.1–34.0)
MCHC: 32.1 g/dL (ref 31.5–36.0)
MCV: 83.4 fL (ref 79.5–101.0)
MONO#: 0.8 10*3/uL (ref 0.1–0.9)
MONO%: 7.7 % (ref 0.0–14.0)
NEUT%: 81.6 % — ABNORMAL HIGH (ref 38.4–76.8)
NEUTROS ABS: 8.9 10*3/uL — AB (ref 1.5–6.5)
NRBC: 1 % — AB (ref 0–0)
Platelets: 228 10*3/uL (ref 145–400)
RBC: 4.33 10*6/uL (ref 3.70–5.45)
RDW: 17.4 % — AB (ref 11.2–14.5)
WBC: 10.9 10*3/uL — AB (ref 3.9–10.3)
lymph#: 0.9 10*3/uL (ref 0.9–3.3)

## 2017-02-12 LAB — TECHNOLOGIST REVIEW

## 2017-02-12 LAB — COMPREHENSIVE METABOLIC PANEL
ALT: 70 U/L — AB (ref 0–55)
AST: 41 U/L — ABNORMAL HIGH (ref 5–34)
Albumin: 3.1 g/dL — ABNORMAL LOW (ref 3.5–5.0)
Alkaline Phosphatase: 70 U/L (ref 40–150)
Anion Gap: 15 mEq/L — ABNORMAL HIGH (ref 3–11)
BILIRUBIN TOTAL: 0.48 mg/dL (ref 0.20–1.20)
BUN: 60.3 mg/dL — AB (ref 7.0–26.0)
CHLORIDE: 101 meq/L (ref 98–109)
CO2: 23 meq/L (ref 22–29)
CREATININE: 1.6 mg/dL — AB (ref 0.6–1.1)
Calcium: 9.8 mg/dL (ref 8.4–10.4)
EGFR: 31 mL/min/{1.73_m2} — ABNORMAL LOW (ref >60)
GLUCOSE: 203 mg/dL — AB (ref 70–140)
Potassium: 4 mEq/L (ref 3.5–5.1)
SODIUM: 139 meq/L (ref 136–145)
TOTAL PROTEIN: 7.2 g/dL (ref 6.4–8.3)

## 2017-02-12 MED ORDER — FLUCONAZOLE 200 MG PO TABS: 200.0000 mg | ORAL_TABLET | Freq: Every day | ORAL | 0 refills | Status: DC

## 2017-02-12 MED ORDER — SODIUM CHLORIDE 0.9% FLUSH
10.0000 mL | Freq: Once | INTRAVENOUS | Status: AC
  Administered 2017-02-12: 10 mL
  Filled 2017-02-12: qty 10

## 2017-02-12 MED ORDER — SODIUM CHLORIDE 0.9 % IV SOLN
Freq: Once | INTRAVENOUS | Status: AC
  Administered 2017-02-12: 12:00:00 via INTRAVENOUS

## 2017-02-12 MED ORDER — SODIUM CHLORIDE 0.9% FLUSH
10.0000 mL | INTRAVENOUS | Status: DC | PRN
  Administered 2017-02-12: 10 mL via INTRAVENOUS
  Filled 2017-02-12: qty 10

## 2017-02-12 MED ORDER — MAGIC MOUTHWASH: 5.0000 mL | Freq: Three times a day (TID) | ORAL | 0 refills | Status: AC | PRN

## 2017-02-12 MED ORDER — HEPARIN SOD (PORK) LOCK FLUSH 100 UNIT/ML IV SOLN
500.0000 [IU] | Freq: Once | INTRAVENOUS | Status: AC
  Administered 2017-02-12: 500 [IU] via INTRAVENOUS
  Filled 2017-02-12: qty 5

## 2017-02-12 NOTE — Progress Notes (Signed)
[name] Follow up:    Laurie Jordan, MD [address] [place] [address]   DIAGNOSIS: Cancer Staging Malignant neoplasm of upper-inner quadrant of left breast in female, estrogen receptor negative ([place]) Staging form: Breast, AJCC 8th Edition - Clinical stage from 09/11/2016: Stage IIIB (cT2, cN1, cM0, G3, ER: Negative, PR: Negative, HER2: Negative) - Unsigned Staging comments: Staged at breast conference on 5.30.18  Malignant neoplasm of upper-inner quadrant of right breast in female, estrogen receptor positive ([place]) Staging form: Breast, AJCC 8th Edition - Clinical stage from 09/11/2016: Stage IA (cT1c, cN0, cM0, G2, ER: Positive, PR: Positive, HER2: Negative) - Unsigned Staging comments: Staged at breast conference on 5.30.18   SUMMARY OF ONCOLOGIC HISTORY:   Malignant neoplasm of upper-inner quadrant of left breast in female, estrogen receptor negative ([place])   09/02/2016 Initial Diagnosis    Bilateral breast cancers: Left breast 11:30 position 6.3 cm with 3 abnormal lymph nodes biopsy of both IDC grade 2 LVI, triple negative, Ki-67 90% T3 N1 stage IIIB; right breast multiple masses 1.4 cm and 1:00, 5 mm 12:30 axilla negative biopsy ILC grade 2, ER 90%, PR 30%, Ki-67 10%, HER-2 negative ratio 1.74, T1 cN0 stage IA      09/25/2016 -  Neo-Adjuvant Chemotherapy    Taxol weekly x 12, followed by Doxorubicin and Cyclophosphamide x 4      10/03/2016 Genetic Testing    Underwent genetic counseling 10/03/2016. Declined genetic testing.      01/03/2017 - 01/05/2017 Hospital Admission    Shortness of breath and wheezing from COPD exacerbation       Malignant neoplasm of upper-inner quadrant of right breast in female, estrogen receptor positive ([place])   09/05/2016 Initial Diagnosis    Malignant neoplasm of upper-inner quadrant of right breast in female, estrogen receptor positive ([place])     10/03/2016 Genetic Testing    Underwent genetic  counseling 10/03/2016. Declined genetic testing.       CURRENT THERAPY: Adriamycin/Cytoxan cycle 4  INTERVAL HISTORY: Laurie Collins 76 y.o. female returns for evaluation prior to chemotherapy.  She has a "cut:" in her mouth and has had decreased PO intake due to this.  She is also having a lot of pain in her legs and back.  This pain has been present since the last treatment.  Her mobility has decreased as well.  She has had some difficulty walking due to this.  She has had some weight loss with an 18 pound weight loss in the past month.  Her son is with her today and he is very deeply concerned about his mom.  He says she barely eats or drinks and she lies around the house all the time.  He has noted a significant decline in her ability to care for herself with the third cycle of AC.     Patient Active Problem List   Diagnosis Date Noted  . Port-A-Cath in place 02/12/2017  . COPD with acute exacerbation ([place]) 01/03/2017  . COPD exacerbation ([place]) 01/03/2017  . Genetic testing 10/04/2016  . Malignant neoplasm of upper-inner quadrant of left breast in female, estrogen receptor negative ([name]) 09/05/2016  . Malignant neoplasm of upper-inner quadrant of right breast in female, estrogen receptor positive ([name]) 09/05/2016    is allergic to doxycycline and penicillins.  MEDICAL HISTORY: Past Medical History:  Diagnosis Date  . Anxiety   . Cancer ([name])    breast  . COPD (chronic obstructive pulmonary disease) ([name])   .  Depression   . Diabetes mellitus without complication ([name])   . Dyspnea   . Hypertension   . PONV (postoperative nausea and vomiting)    very,very,very sick from anesthesia  . Tuberculosis    1962    SURGICAL HISTORY: Past Surgical History:  Procedure Laterality Date  . BREAST SURGERY     5 breast lumpectomy  . PORTACATH PLACEMENT Right 09/24/2016   Procedure: INSERTION PORT-A-CATH;  Surgeon: [name], MD;  Location: [facility];  Service: General;  Laterality:  Right;    SOCIAL HISTORY: Social History   Social History  . Marital status: Widowed    Spouse name: N/A  . Number of children: N/A  . Years of education: N/A   Occupational History  . Not on file.   Social History Main Topics  . Smoking status: Former Smoker    Packs/day: 2.00    Years: 25.00    Types: Cigarettes    Quit date: 2013  . Smokeless tobacco: Never Used  . Alcohol use No  . Drug use: No  . Sexual activity: Not on file   Other Topics Concern  . Not on file   Social History Narrative  . No narrative on file    FAMILY HISTORY: Family History  Problem Relation Age of Onset  . Stomach cancer Mother 52       d.67 from metastatic disease  . Cancer Maternal Aunt        Unspecified type  . Cancer Maternal Uncle        Unspecified type    Review of Systems  Constitutional: Positive for fatigue and unexpected weight change. Negative for appetite change, chills and fever.  HENT:   Positive for mouth sores and sore throat. Negative for hearing loss, lump/mass and trouble swallowing.   Eyes: Negative for eye problems and icterus.  Respiratory: Negative for chest tightness, cough and shortness of breath.   Cardiovascular: Negative for chest pain and leg swelling.  Gastrointestinal: Negative for abdominal distention and abdominal pain.  Endocrine: Negative for hot flashes.  Musculoskeletal: Positive for arthralgias, back pain (midline back pain and aching legs bilaterally x 1 week) and myalgias.  Neurological: Negative for dizziness, extremity weakness, headaches and numbness.  Hematological: Negative for adenopathy. Does not bruise/bleed easily.  Psychiatric/Behavioral: Negative for depression. The patient is not nervous/anxious.       PHYSICAL EXAMINATION  ECOG PERFORMANCE STATUS: 3 - Symptomatic, >50% confined to bed  Vitals:   02/12/17 1100 02/12/17 1104  BP: (!) 147/103 104/88  Pulse: 78   Resp: 16   Temp: (!) 97.5 F (36.4 C)   SpO2: 100%      Physical Exam  Constitutional: She is oriented to person, place, and time and well-developed, well-nourished, and in no distress.  Appears tired, weak, deconditioned  HENT:  Head: Normocephalic and atraumatic.  Mouth with ulceration under tongue, and thrush noted throughout buccal mucosa, tongue and posterior pharynx  Eyes: Pupils are equal, round, and reactive to light. No scleral icterus.  Neck: Neck supple.  Cardiovascular: Normal rate, regular rhythm and normal heart sounds.   Pulmonary/Chest: Effort normal and breath sounds normal.  Abdominal: Soft. Bowel sounds are normal. She exhibits no distension. There is no tenderness.  Musculoskeletal: She exhibits no edema.  Lymphadenopathy:    She has no cervical adenopathy.  Neurological: She is alert and oriented to person, place, and time.  Skin: Skin is warm and dry. No rash noted.  Psychiatric: Mood and affect normal.  LABORATORY DATA:  CBC    Component Value Date/Time   WBC 10.9 (H) 02/12/2017 0949   WBC 49.6 (H) 01/03/2017 0343   RBC 4.33 02/12/2017 0949   RBC 3.67 (L) 01/03/2017 0343   HGB 11.6 02/12/2017 0949   HCT 36.1 02/12/2017 0949   PLT 228 02/12/2017 0949   MCV 83.4 02/12/2017 0949   MCH 26.8 02/12/2017 0949   MCH 26.7 01/03/2017 0343   MCHC 32.1 02/12/2017 0949   MCHC 31.3 01/03/2017 0343   RDW 17.4 (H) 02/12/2017 0949   LYMPHSABS 0.9 02/12/2017 0949   MONOABS 0.8 02/12/2017 0949   EOSABS 0.0 02/12/2017 0949   BASOSABS 0.3 (H) 02/12/2017 0949    CMP     Component Value Date/Time   NA 139 02/12/2017 0949   K 4.0 02/12/2017 0949   CL 100 (L) 01/03/2017 0912   CO2 23 02/12/2017 0949   GLUCOSE 203 (H) 02/12/2017 0949   BUN 60.3 (H) 02/12/2017 0949   CREATININE 1.6 (H) 02/12/2017 0949   CALCIUM 9.8 02/12/2017 0949   PROT 7.2 02/12/2017 0949   ALBUMIN 3.1 (L) 02/12/2017 0949   AST 41 (H) 02/12/2017 0949   ALT 70 (H) 02/12/2017 0949   ALKPHOS 70 02/12/2017 0949   BILITOT 0.48 02/12/2017 0949    GFRNONAA 47 (L) 01/03/2017 0912   GFRAA 54 (L) 01/03/2017 0912            ASSESSMENT and THERAPY PLAN:   Malignant neoplasm of upper-inner quadrant of left breast in female, estrogen receptor negative ([place]) Bilateral breast cancers:  Left breast11:30 position 6.3 cm with 3 abnormal lymph nodes biopsy of both IDC grade 2 LVI, triple negative, Ki-67 90% T3 N1 stage IIIB; Right breastmultiple masses 1.4 cm and 1:00, 5 mm 12:30 axilla negative biopsy ILC grade 2, ER 90%, PR 30%, Ki-67 10%, HER-2 negative ratio 1.74, T1 cN0 stage IA  Treatment plan: 1. Neoadjuvant chemotherapy: Weekly Taxol 12 and Adriamycin Cytoxan every 2 weeks 4 2. Followed by surgery 3. Followed by radiation 4. Followed by antiestrogen therapy because of right-sided breast cancer is ER positive ------------------------------------------------------------------- Current treatment: Adriamycin/Cytoxan cycle 4 day 1 Echocardiogram 12/30/2016: EF 65-70% Labs have been reviewed  Chemotherapy toxicities: [place] looks particularly poor today.  She has thrush, decreased PO intake, and her mobility, which was already pretty low due to her sedentary lifestyle is now much worse to where she does a majority of sitting and lying and isn't really caring for herself like she should be.  Her kidney function is elevated today, she has thrush, and she is likely dehydrated.  I prescribed magic mouthwash and fluconazole for her thrush.  She will receive 1L NS today.  She was encouraged to increase her PO intake.  I also encouraged her to get up and move.  She tells me she is, and her son tells me she isn't.  I recommend PT, she declined.  I recommended home health eval, she declined.  I informed her that she needs to eat, drink, move around, in order to better condition herself as she will need breast surgery soon.  Due to the pain in her back, I ordered xrays, and instructed her to go to xray and have them done when she is finished  with her fluids today.  She verbalized understanding.  I told her that she may need to skip the final dose of AC, but should f/u with Dr. [name] for further discussion.  I requested she see him next week for  labs/eval.       Orders Placed This Encounter  Procedures  . DG Thoracic Spine 2 View    Standing Status:   Future    Standing Expiration Date:   02/12/2018    Order Specific Question:   Reason for Exam (SYMPTOM  OR DIAGNOSIS REQUIRED)    Answer:   back pain, breast cancer    Order Specific Question:   Preferred imaging location?    Answer:   [facility]    Order Specific Question:   Radiology Contrast Protocol - do NOT remove file path    Answer:   \\[email or web address].[email or web address]  . DG Lumbar Spine 2-3 Views    Standing Status:   Future    Standing Expiration Date:   02/12/2018    Order Specific Question:   Reason for Exam (SYMPTOM  OR DIAGNOSIS REQUIRED)    Answer:   back pain, breast cancer    Order Specific Question:   Preferred imaging location?    Answer:   [facility]    Order Specific Question:   Radiology Contrast Protocol - do NOT remove file path    Answer:   \\[email or web address]    All questions were answered. The patient knows to call the clinic with any problems, questions or concerns. We can certainly see the patient much sooner if necessary.  A total of (30) minutes of face-to-face time was spent with this patient with greater than 50% of that time in counseling and care-coordination.  This note was electronically signed. Scot Dock, NP 02/13/2017

## 2017-02-12 NOTE — Patient Instructions (Signed)
Dehydration, Adult Dehydration is a condition in which there is not enough fluid or water in the body. This happens when you lose more fluids than you take in. Important organs, such as the kidneys, brain, and heart, cannot function without a proper amount of fluids. Any loss of fluids from the body can lead to dehydration. Dehydration can range from mild to severe. This condition should be treated right away to prevent it from becoming severe. What are the causes? This condition may be caused by:  Vomiting.  Diarrhea.  Excessive sweating, such as from heat exposure or exercise.  Not drinking enough fluid, especially: ? When ill. ? While doing activity that requires a lot of energy.  Excessive urination.  Fever.  Infection.  Certain medicines, such as medicines that cause the body to lose excess fluid (diuretics).  Inability to access safe drinking water.  Reduced physical ability to get adequate water and food.  What increases the risk? This condition is more likely to develop in people:  Who have a poorly controlled long-term (chronic) illness, such as diabetes, heart disease, or kidney disease.  Who are age 65 or older.  Who are disabled.  Who live in a place with high altitude.  Who play endurance sports.  What are the signs or symptoms? Symptoms of mild dehydration may include:  Thirst.  Dry lips.  Slightly dry mouth.  Dry, warm skin.  Dizziness. Symptoms of moderate dehydration may include:  Very dry mouth.  Muscle cramps.  Dark urine. Urine may be the color of tea.  Decreased urine production.  Decreased tear production.  Heartbeat that is irregular or faster than normal (palpitations).  Headache.  Light-headedness, especially when you stand up from a sitting position.  Fainting (syncope). Symptoms of severe dehydration may include:  Changes in skin, such as: ? Cold and clammy skin. ? Blotchy (mottled) or pale skin. ? Skin that does  not quickly return to normal after being lightly pinched and released (poor skin turgor).  Changes in body fluids, such as: ? Extreme thirst. ? No tear production. ? Inability to sweat when body temperature is high, such as in hot weather. ? Very little urine production.  Changes in vital signs, such as: ? Weak pulse. ? Pulse that is more than 100 beats a minute when sitting still. ? Rapid breathing. ? Low blood pressure.  Other changes, such as: ? Sunken eyes. ? Cold hands and feet. ? Confusion. ? Lack of energy (lethargy). ? Difficulty waking up from sleep. ? Short-term weight loss. ? Unconsciousness. How is this diagnosed? This condition is diagnosed based on your symptoms and a physical exam. Blood and urine tests may be done to help confirm the diagnosis. How is this treated? Treatment for this condition depends on the severity. Mild or moderate dehydration can often be treated at home. Treatment should be started right away. Do not wait until dehydration becomes severe. Severe dehydration is an emergency and it needs to be treated in a hospital. Treatment for mild dehydration may include:  Drinking more fluids.  Replacing salts and minerals in your blood (electrolytes) that you may have lost. Treatment for moderate dehydration may include:  Drinking an oral rehydration solution (ORS). This is a drink that helps you replace fluids and electrolytes (rehydrate). It can be found at pharmacies and retail stores. Treatment for severe dehydration may include:  Receiving fluids through an IV tube.  Receiving an electrolyte solution through a feeding tube that is passed through your nose   and into your stomach (nasogastric tube, or NG tube).  Correcting any abnormalities in electrolytes.  Treating the underlying cause of dehydration. Follow these instructions at home:  If directed by your health care provider, drink an ORS: ? Make an ORS by following instructions on the  package. ? Start by drinking small amounts, about  cup (120 mL) every 5-10 minutes. ? Slowly increase how much you drink until you have taken the amount recommended by your health care provider.  Drink enough clear fluid to keep your urine clear or pale yellow. If you were told to drink an ORS, finish the ORS first, then start slowly drinking other clear fluids. Drink fluids such as: ? Water. Do not drink only water. Doing that can lead to having too little salt (sodium) in the body (hyponatremia). ? Ice chips. ? Fruit juice that you have added water to (diluted fruit juice). ? Low-calorie sports drinks.  Avoid: ? Alcohol. ? Drinks that contain a lot of sugar. These include high-calorie sports drinks, fruit juice that is not diluted, and soda. ? Caffeine. ? Foods that are greasy or contain a lot of fat or sugar.  Take over-the-counter and prescription medicines only as told by your health care provider.  Do not take sodium tablets. This can lead to having too much sodium in the body (hypernatremia).  Eat foods that contain a healthy balance of electrolytes, such as bananas, oranges, potatoes, tomatoes, and spinach.  Keep all follow-up visits as told by your health care provider. This is important. Contact a health care provider if:  You have abdominal pain that: ? Gets worse. ? Stays in one area (localizes).  You have a rash.  You have a stiff neck.  You are more irritable than usual.  You are sleepier or more difficult to wake up than usual.  You feel weak or dizzy.  You feel very thirsty.  You have urinated only a small amount of very dark urine over 6-8 hours. Get help right away if:  You have symptoms of severe dehydration.  You cannot drink fluids without vomiting.  Your symptoms get worse with treatment.  You have a fever.  You have a severe headache.  You have vomiting or diarrhea that: ? Gets worse. ? Does not go away.  You have blood or green matter  (bile) in your vomit.  You have blood in your stool. This may cause stool to look black and tarry.  You have not urinated in 6-8 hours.  You faint.  Your heart rate while sitting still is over 100 beats a minute.  You have trouble breathing. This information is not intended to replace advice given to you by your health care provider. Make sure you discuss any questions you have with your health care provider. Document Released: 04/01/2005 Document Revised: 10/27/2015 Document Reviewed: 05/26/2015 Elsevier Interactive Patient Education  2018 [employer]  

## 2017-02-12 NOTE — Assessment & Plan Note (Addendum)
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 Neoadjuvant chemotherapy: Weekly Taxol 12 and Adriamycin Cytoxan every 2 weeks 4 2. Followed by surgery 3. Followed by radiation 4. Followed by antiestrogen therapy because of right-sided breast cancer is ER positive ------------------------------------------------------------------- Current treatment: Adriamycin/Cytoxan cycle 4 day 1 Echocardiogram 12/30/2016: EF 65-70% Labs have been reviewed  Chemotherapy toxicities: Raynetta looks particularly poor today.  She has thrush, decreased PO intake, and her mobility, which was already pretty low due to her sedentary lifestyle is now much worse to where she does a majority of sitting and lying and isn't really caring for herself like she should be.  Her kidney function is elevated today, she has thrush, and she is likely dehydrated.  I prescribed magic mouthwash and fluconazole for her thrush.  She will receive 1L NS today.  She was encouraged to increase her PO intake.  I also encouraged her to get up and move.  She tells me she is, and her son tells me she isn't.  I recommend PT, she declined.  I recommended home health eval, she declined.  I informed her that she needs to eat, drink, move around, in order to better condition herself as she will need breast surgery soon.  Due to the pain in her back, I ordered xrays, and instructed her to go to xray and have them done when she is finished with her fluids today.  She verbalized understanding.  I told her that she may need to skip the final dose of AC, but should f/u with Dr. [name] for further discussion.  I requested she see him next week for labs/eval.

## 2017-02-13 ENCOUNTER — Encounter: Payer: Self-pay | Admitting: Adult Health

## 2017-02-13 ENCOUNTER — Telehealth: Payer: Self-pay | Admitting: Hematology and Oncology

## 2017-02-13 NOTE — Telephone Encounter (Signed)
Per 10/31 los already done

## 2017-02-14 ENCOUNTER — Telehealth: Payer: Self-pay | Admitting: Hematology and Oncology

## 2017-02-14 NOTE — Telephone Encounter (Signed)
Spoke to patient regarding upcoming November appointments. °

## 2017-02-15 ENCOUNTER — Inpatient Hospital Stay (HOSPITAL_COMMUNITY)
Admission: EM | Admit: 2017-02-15 | Discharge: 2017-02-20 | DRG: 392 | Disposition: A | Discharge status: Skilled Nursing Facility | Payer: Medicare HMO | Attending: Family Medicine | Admitting: Family Medicine

## 2017-02-15 ENCOUNTER — Encounter (HOSPITAL_COMMUNITY): Payer: Self-pay

## 2017-02-15 ENCOUNTER — Other Ambulatory Visit: Payer: Self-pay

## 2017-02-15 ENCOUNTER — Emergency Department (HOSPITAL_COMMUNITY): Payer: Medicare HMO

## 2017-02-15 ENCOUNTER — Other Ambulatory Visit (HOSPITAL_COMMUNITY): Payer: Self-pay

## 2017-02-15 DIAGNOSIS — Z87891 Personal history of nicotine dependence: Secondary | ICD-10-CM

## 2017-02-15 DIAGNOSIS — Z7984 Long term (current) use of oral hypoglycemic drugs: Secondary | ICD-10-CM

## 2017-02-15 DIAGNOSIS — E279 Disorder of adrenal gland, unspecified: Secondary | ICD-10-CM | POA: Diagnosis not present

## 2017-02-15 DIAGNOSIS — C50211 Malignant neoplasm of upper-inner quadrant of right female breast: Secondary | ICD-10-CM | POA: Diagnosis present

## 2017-02-15 DIAGNOSIS — R945 Abnormal results of liver function studies: Secondary | ICD-10-CM

## 2017-02-15 DIAGNOSIS — B370 Candidal stomatitis: Secondary | ICD-10-CM

## 2017-02-15 DIAGNOSIS — J449 Chronic obstructive pulmonary disease, unspecified: Secondary | ICD-10-CM | POA: Diagnosis present

## 2017-02-15 DIAGNOSIS — E860 Dehydration: Secondary | ICD-10-CM | POA: Diagnosis not present

## 2017-02-15 DIAGNOSIS — Z881 Allergy status to other antibiotic agents status: Secondary | ICD-10-CM | POA: Diagnosis not present

## 2017-02-15 DIAGNOSIS — E1142 Type 2 diabetes mellitus with diabetic polyneuropathy: Secondary | ICD-10-CM | POA: Diagnosis not present

## 2017-02-15 DIAGNOSIS — R531 Weakness: Secondary | ICD-10-CM | POA: Diagnosis not present

## 2017-02-15 DIAGNOSIS — R1084 Generalized abdominal pain: Secondary | ICD-10-CM | POA: Diagnosis not present

## 2017-02-15 DIAGNOSIS — E876 Hypokalemia: Secondary | ICD-10-CM | POA: Diagnosis not present

## 2017-02-15 DIAGNOSIS — Z9221 Personal history of antineoplastic chemotherapy: Secondary | ICD-10-CM

## 2017-02-15 DIAGNOSIS — M549 Dorsalgia, unspecified: Secondary | ICD-10-CM | POA: Diagnosis not present

## 2017-02-15 DIAGNOSIS — R197 Diarrhea, unspecified: Secondary | ICD-10-CM | POA: Diagnosis not present

## 2017-02-15 DIAGNOSIS — M6281 Muscle weakness (generalized): Secondary | ICD-10-CM | POA: Diagnosis not present

## 2017-02-15 DIAGNOSIS — E119 Type 2 diabetes mellitus without complications: Secondary | ICD-10-CM | POA: Diagnosis not present

## 2017-02-15 DIAGNOSIS — T451X5A Adverse effect of antineoplastic and immunosuppressive drugs, initial encounter: Secondary | ICD-10-CM | POA: Diagnosis present

## 2017-02-15 DIAGNOSIS — K7689 Other specified diseases of liver: Secondary | ICD-10-CM | POA: Diagnosis not present

## 2017-02-15 DIAGNOSIS — R740 Nonspecific elevation of levels of transaminase and lactic acid dehydrogenase [LDH]: Secondary | ICD-10-CM | POA: Diagnosis present

## 2017-02-15 DIAGNOSIS — R404 Transient alteration of awareness: Secondary | ICD-10-CM | POA: Diagnosis not present

## 2017-02-15 DIAGNOSIS — G8929 Other chronic pain: Secondary | ICD-10-CM | POA: Diagnosis present

## 2017-02-15 DIAGNOSIS — R262 Difficulty in walking, not elsewhere classified: Secondary | ICD-10-CM | POA: Diagnosis not present

## 2017-02-15 DIAGNOSIS — D3502 Benign neoplasm of left adrenal gland: Secondary | ICD-10-CM | POA: Diagnosis present

## 2017-02-15 DIAGNOSIS — C50212 Malignant neoplasm of upper-inner quadrant of left female breast: Secondary | ICD-10-CM | POA: Diagnosis not present

## 2017-02-15 DIAGNOSIS — Z7952 Long term (current) use of systemic steroids: Secondary | ICD-10-CM

## 2017-02-15 DIAGNOSIS — Y92239 Unspecified place in hospital as the place of occurrence of the external cause: Secondary | ICD-10-CM | POA: Diagnosis present

## 2017-02-15 DIAGNOSIS — R634 Abnormal weight loss: Secondary | ICD-10-CM | POA: Diagnosis not present

## 2017-02-15 DIAGNOSIS — Z800 Family history of malignant neoplasm of digestive organs: Secondary | ICD-10-CM | POA: Diagnosis not present

## 2017-02-15 DIAGNOSIS — E785 Hyperlipidemia, unspecified: Secondary | ICD-10-CM | POA: Diagnosis present

## 2017-02-15 DIAGNOSIS — Z79899 Other long term (current) drug therapy: Secondary | ICD-10-CM

## 2017-02-15 DIAGNOSIS — M545 Low back pain: Secondary | ICD-10-CM | POA: Diagnosis not present

## 2017-02-15 DIAGNOSIS — I10 Essential (primary) hypertension: Secondary | ICD-10-CM | POA: Diagnosis not present

## 2017-02-15 DIAGNOSIS — C50912 Malignant neoplasm of unspecified site of left female breast: Secondary | ICD-10-CM | POA: Diagnosis not present

## 2017-02-15 DIAGNOSIS — Z170 Estrogen receptor positive status [ER+]: Secondary | ICD-10-CM | POA: Diagnosis not present

## 2017-02-15 DIAGNOSIS — Z7982 Long term (current) use of aspirin: Secondary | ICD-10-CM

## 2017-02-15 DIAGNOSIS — Z880 Allergy status to penicillin: Secondary | ICD-10-CM | POA: Diagnosis not present

## 2017-02-15 DIAGNOSIS — K591 Functional diarrhea: Secondary | ICD-10-CM | POA: Diagnosis not present

## 2017-02-15 DIAGNOSIS — K529 Noninfective gastroenteritis and colitis, unspecified: Secondary | ICD-10-CM | POA: Diagnosis not present

## 2017-02-15 LAB — I-STAT CG4 LACTIC ACID, ED: LACTIC ACID, VENOUS: 3.85 mmol/L — AB (ref 0.5–1.9)

## 2017-02-15 LAB — CBG MONITORING, ED: Glucose-Capillary: 128 mg/dL — ABNORMAL HIGH (ref 65–99)

## 2017-02-15 MED ORDER — SODIUM CHLORIDE 0.9 % IV BOLUS (SEPSIS)
1000.0000 mL | Freq: Once | INTRAVENOUS | Status: AC
  Administered 2017-02-16: 1000 mL via INTRAVENOUS

## 2017-02-15 NOTE — ED Triage Notes (Signed)
Pt arrived via [facility] from home with complaints of generalized weakness. Pt hx of breast cancer and recent renal insuffiencey. No current complaints of pain. Alert and oriented x4. Given 765ml of fluids in route. Family reported to EMS that she has recently had and oral abscess but could not say if she was on antibiotics.

## 2017-02-15 NOTE — ED Notes (Signed)
Bed: SK81 Expected date:  Expected time:  Means of arrival:  Comments: 70 f weakness

## 2017-02-15 NOTE — ED Notes (Signed)
Patient transported to CT 

## 2017-02-15 NOTE — ED Provider Notes (Signed)
[facility] DEPT Provider Note   CSN: [id number] Arrival date & time: 02/15/17  2235     History   Chief Complaint Chief Complaint  Patient presents with  . Weakness    HPI Laurie Collins is a 76 y.o. female hx of breast cancer, COPD, DM, HTN, here with weakness, trouble walking.  Patient states that she has been diffusely weak and had some trouble walking for the last week or so.  Patient was seen in oncology office several days ago and was diagnosed with oral thrush and is still on fluconazole, Magic mouthwash and the thrush has improved.  She has some subjective chills but denies any actual fevers.  Patient also has been having some loose stools as well as some dysuria as well.  Patient denies any focal numbness or weakness in her legs but just has trouble walking.  Patient does have breast cancer and is on chemotherapy.   The history is provided by the patient.    Past Medical History:  Diagnosis Date  . Anxiety   . Cancer ([place])    breast  . COPD (chronic obstructive pulmonary disease) ([place])   . Depression   . Diabetes mellitus without complication ([place])   . Dyspnea   . Hypertension   . PONV (postoperative nausea and vomiting)    very,very,very sick from anesthesia  . Tuberculosis    1962    Patient Active Problem List   Diagnosis Date Noted  . Port-A-Cath in place 02/12/2017  . COPD with acute exacerbation ([place]) 01/03/2017  . COPD exacerbation ([place]) 01/03/2017  . Genetic testing 10/04/2016  . Malignant neoplasm of upper-inner quadrant of left breast in female, estrogen receptor negative ([place]) 09/05/2016  . Malignant neoplasm of upper-inner quadrant of right breast in female, estrogen receptor positive ([place]) 09/05/2016    Past Surgical History:  Procedure Laterality Date  . BREAST SURGERY     5 breast lumpectomy  . PORTACATH PLACEMENT Right 09/24/2016   Procedure: INSERTION PORT-A-CATH;  Surgeon: [name], MD;  Location:  [facility];  Service: General;  Laterality: Right;    OB History    No data available       Home Medications    Prior to Admission medications   Medication Sig Start Date End Date Taking? Authorizing Provider  amLODipine (NORVASC) 10 MG tablet Take 10 mg by mouth daily.   Yes [provider]  aspirin 81 MG chewable tablet Chew 81 mg by mouth daily.   Yes [provider]  Cholecalciferol (VITAMIN D) 2000 units tablet Take 2,000 Units by mouth daily.   Yes [provider]  dexamethasone (DECADRON) 4 MG tablet Take 2 tablets (8 mg total) by mouth daily. 12/25/16  Yes Causey, Charlestine Massed, NP  fluconazole (DIFLUCAN) 200 MG tablet Take 1 tablet (200 mg total) by mouth daily. 02/12/17  Yes Causey, Charlestine Massed, NP  glimepiride (AMARYL) 1 MG tablet Take 1 mg by mouth daily with breakfast.   Yes [provider]  hydrochlorothiazide (MICROZIDE) 12.5 MG capsule Take 12.5 mg by mouth daily.   Yes [provider]  lisinopril (PRINIVIL,ZESTRIL) 20 MG tablet Take 20 mg by mouth daily.   Yes [provider]  loperamide (IMODIUM) 2 MG capsule Take 2 mg by mouth every 4 (four) hours as needed for diarrhea or loose stools.    Yes [provider]  magic mouthwash SOLN Take 5 mLs by mouth 3 (three) times daily as needed for mouth pain. 02/12/17  Yes Causey, Charlestine Massed, NP  metFORMIN (GLUCOPHAGE) 1000 MG tablet Take 1,000 mg by mouth 2 (two) times daily with a meal.   Yes [provider]  metoprolol succinate (TOPROL-XL) 100 MG 24 hr tablet Take 100 mg by mouth daily. 07/16/16  Yes [provider]  predniSONE (DELTASONE) 20 MG tablet Take 1 tablet (20 mg total) by mouth daily with breakfast. 01/05/17  Yes Gherghe, Vella Redhead, MD  simvastatin (ZOCOR) 10 MG tablet Take 10 mg by mouth at bedtime. 07/16/16  Yes [provider]  Triamcinolone Acetonide (TRIAMCINOLONE 0.1 % CREAM : EUCERIN) CREA Apply 1 application topically 2  (two) times daily as needed. 12/25/16  Yes Causey, Charlestine Massed, NP  albuterol (PROVENTIL HFA;VENTOLIN HFA) 108 (90 Base) MCG/ACT inhaler Inhale 2 puffs into the lungs every 6 (six) hours as needed for wheezing or shortness of breath. 01/05/17   Caren Griffins, MD  tiotropium (SPIRIVA HANDIHALER) 18 MCG inhalation capsule Place 1 capsule (18 mcg total) into inhaler and inhale daily. Patient not taking: Reported on 02/16/2017 01/05/17 01/05/18  Caren Griffins, MD    Family History Family History  Problem Relation Age of Onset  . Stomach cancer Mother 67       d.67 from metastatic disease  . Cancer Maternal Aunt        Unspecified type  . Cancer Maternal Uncle        Unspecified type    Social History Social History  Substance Use Topics  . Smoking status: Former Smoker    Packs/day: 2.00    Years: 25.00    Types: Cigarettes    Quit date: 2013  . Smokeless tobacco: Never Used  . Alcohol use No     Allergies   Doxycycline and Penicillins   Review of Systems Review of Systems  Neurological: Positive for weakness.  All other systems reviewed and are negative.    Physical Exam Updated Vital Signs BP (!) 99/55 (BP Location: Right Arm)   Pulse 95   Temp 97.8 F (36.6 C) (Oral)   Resp 18   Ht 5\' 3"  (1.6 m)   Wt 73.5 kg (162 lb)   SpO2 99%   BMI 28.70 kg/m   Physical Exam  Constitutional: She is oriented to person, place, and time.  Chronically ill   HENT:  Head: Normocephalic.  MM slightly dry. Oral thrush, poor dentition, no obvious periapical abscess  Eyes: Pupils are equal, round, and reactive to light. Conjunctivae and EOM are normal.  Neck: Normal range of motion. Neck supple.  Cardiovascular: Normal rate, regular rhythm and normal heart sounds.   Pulmonary/Chest: Effort normal and breath sounds normal. No respiratory distress. She has no wheezes. She has no rales.  Abdominal:  Mild epigastric tenderness   Musculoskeletal:  Mild lower lumbar  tenderness   Neurological: She is alert and oriented to person, place, and time. No cranial nerve deficit. Coordination normal.  No obvious saddle anesthesia. Nl strength bilateral lower extremities   Skin: Skin is warm.  Psychiatric: She has a normal mood and affect.  Nursing note and vitals reviewed.    ED Treatments / Results  Labs (all labs ordered are listed, but only abnormal results are displayed) Labs Reviewed  COMPREHENSIVE METABOLIC PANEL - Abnormal; Notable for the following:       Result Value   Potassium 2.8 (*)    Glucose, Bld 115 (*)    BUN 41 (*)    Creatinine, Ser 1.37 (*)  Total Protein 6.4 (*)    Albumin 3.2 (*)    AST 52 (*)    ALT 69 (*)    GFR calc non Af Amer 36 (*)    GFR calc Af Amer 42 (*)    All other components within normal limits  CBC WITH DIFFERENTIAL/PLATELET - Abnormal; Notable for the following:    WBC 30.3 (*)    Hemoglobin 10.9 (*)    HCT 34.1 (*)    RDW 17.3 (*)    All other components within normal limits  CBG MONITORING, ED - Abnormal; Notable for the following:    Glucose-Capillary 128 (*)    All other components within normal limits  I-STAT CG4 LACTIC ACID, ED - Abnormal; Notable for the following:    Lactic Acid, Venous 3.85 (*)    All other components within normal limits  I-STAT CHEM 8, ED - Abnormal; Notable for the following:    Potassium 3.4 (*)    BUN 48 (*)    Creatinine, Ser 1.40 (*)    Glucose, Bld 115 (*)    Calcium, Ion 0.99 (*)    Hemoglobin 10.9 (*)    HCT 32.0 (*)    All other components within normal limits  CULTURE, BLOOD (ROUTINE X 2)  CULTURE, BLOOD (ROUTINE X 2)  URINE CULTURE  C DIFFICILE QUICK SCREEN W PCR REFLEX  URINALYSIS, ROUTINE W REFLEX MICROSCOPIC  I-STAT TROPONIN, ED  I-STAT CG4 LACTIC ACID, ED    EKG  EKG Interpretation None       Radiology Dg Chest 2 View  Result Date: 02/15/2017 CLINICAL DATA:  76 year old female with weakness. EXAM: CHEST  2 VIEW COMPARISON:  Chest CT dated  01/03/2017 FINDINGS: Right pectoral Port-A-Cath with the tip over central SVC. The lungs are clear. There is no pleural effusion or pneumothorax. The cardiac silhouette is within normal limits. No acute osseous pathology. IMPRESSION: No active cardiopulmonary disease. Electronically Signed   By: Anner Crete M.D.   On: 02/15/2017 23:35    Procedures Procedures (including critical care time)  Medications Ordered in ED Medications  sodium chloride 0.9 % bolus 1,000 mL (not administered)  iopamidol (ISOVUE-300) 61 % injection (not administered)  sodium chloride 0.9 % bolus 1,000 mL (not administered)  ciprofloxacin (CIPRO) IVPB 400 mg (not administered)  metroNIDAZOLE (FLAGYL) IVPB 500 mg (not administered)  iopamidol (ISOVUE-300) 61 % injection 80 mL (80 mLs Intravenous Contrast Given 02/16/17 0043)     Initial Impression / Assessment and Plan / ED Course  I have reviewed the triage vital signs and the nursing notes.  Pertinent labs & imaging results that were available during my care of the patient were reviewed by me and considered in my medical decision making (see chart for details).     JACIA SICKMAN is a 76 y.o. female here with weakness, trouble walking. Patient hypotensive. Sepsis workup initiated. Trouble walking may be from dehydration and hypotension vs spinal mets (no previous mets on bone scan 2 months ago). Will do sepsis workup with CBC, CMP, CXR, UA. Will also get CT ab/pel, CT lumbar spine.   1:10 AM WBC 30. K 2.8, supplemented. Lactate 3.8. Given cipro/flagyl empirically for colitis. C diff ordered. Hospitalist to admit for colitis, sepsis.   Final Clinical Impressions(s) / ED Diagnoses   Final diagnoses:  Back pain    New Prescriptions New Prescriptions   No medications on file     Drenda Freeze, MD 02/16/17 0110

## 2017-02-16 ENCOUNTER — Other Ambulatory Visit: Payer: Self-pay

## 2017-02-16 ENCOUNTER — Emergency Department (HOSPITAL_COMMUNITY): Payer: Medicare HMO

## 2017-02-16 ENCOUNTER — Encounter (HOSPITAL_COMMUNITY): Payer: Self-pay | Admitting: Internal Medicine

## 2017-02-16 DIAGNOSIS — Z880 Allergy status to penicillin: Secondary | ICD-10-CM | POA: Diagnosis not present

## 2017-02-16 DIAGNOSIS — E785 Hyperlipidemia, unspecified: Secondary | ICD-10-CM | POA: Diagnosis present

## 2017-02-16 DIAGNOSIS — Z881 Allergy status to other antibiotic agents status: Secondary | ICD-10-CM | POA: Diagnosis not present

## 2017-02-16 DIAGNOSIS — R634 Abnormal weight loss: Secondary | ICD-10-CM | POA: Diagnosis not present

## 2017-02-16 DIAGNOSIS — K529 Noninfective gastroenteritis and colitis, unspecified: Secondary | ICD-10-CM | POA: Diagnosis present

## 2017-02-16 DIAGNOSIS — I10 Essential (primary) hypertension: Secondary | ICD-10-CM | POA: Diagnosis present

## 2017-02-16 DIAGNOSIS — E860 Dehydration: Secondary | ICD-10-CM | POA: Diagnosis present

## 2017-02-16 DIAGNOSIS — Z7952 Long term (current) use of systemic steroids: Secondary | ICD-10-CM | POA: Diagnosis not present

## 2017-02-16 DIAGNOSIS — Y92239 Unspecified place in hospital as the place of occurrence of the external cause: Secondary | ICD-10-CM | POA: Diagnosis present

## 2017-02-16 DIAGNOSIS — E876 Hypokalemia: Secondary | ICD-10-CM | POA: Diagnosis present

## 2017-02-16 DIAGNOSIS — R740 Nonspecific elevation of levels of transaminase and lactic acid dehydrogenase [LDH]: Secondary | ICD-10-CM | POA: Diagnosis present

## 2017-02-16 DIAGNOSIS — Z170 Estrogen receptor positive status [ER+]: Secondary | ICD-10-CM | POA: Diagnosis not present

## 2017-02-16 DIAGNOSIS — Z87891 Personal history of nicotine dependence: Secondary | ICD-10-CM | POA: Diagnosis not present

## 2017-02-16 DIAGNOSIS — R531 Weakness: Secondary | ICD-10-CM | POA: Diagnosis not present

## 2017-02-16 DIAGNOSIS — C50211 Malignant neoplasm of upper-inner quadrant of right female breast: Secondary | ICD-10-CM | POA: Diagnosis present

## 2017-02-16 DIAGNOSIS — G8929 Other chronic pain: Secondary | ICD-10-CM | POA: Diagnosis present

## 2017-02-16 DIAGNOSIS — B370 Candidal stomatitis: Secondary | ICD-10-CM | POA: Diagnosis present

## 2017-02-16 DIAGNOSIS — T451X5A Adverse effect of antineoplastic and immunosuppressive drugs, initial encounter: Secondary | ICD-10-CM | POA: Diagnosis present

## 2017-02-16 DIAGNOSIS — Z7982 Long term (current) use of aspirin: Secondary | ICD-10-CM | POA: Diagnosis not present

## 2017-02-16 DIAGNOSIS — R197 Diarrhea, unspecified: Secondary | ICD-10-CM | POA: Diagnosis not present

## 2017-02-16 DIAGNOSIS — R945 Abnormal results of liver function studies: Secondary | ICD-10-CM

## 2017-02-16 DIAGNOSIS — K7689 Other specified diseases of liver: Secondary | ICD-10-CM | POA: Diagnosis not present

## 2017-02-16 DIAGNOSIS — Z800 Family history of malignant neoplasm of digestive organs: Secondary | ICD-10-CM | POA: Diagnosis not present

## 2017-02-16 DIAGNOSIS — C50212 Malignant neoplasm of upper-inner quadrant of left female breast: Secondary | ICD-10-CM | POA: Diagnosis present

## 2017-02-16 DIAGNOSIS — M549 Dorsalgia, unspecified: Secondary | ICD-10-CM | POA: Diagnosis present

## 2017-02-16 DIAGNOSIS — E1142 Type 2 diabetes mellitus with diabetic polyneuropathy: Secondary | ICD-10-CM | POA: Diagnosis present

## 2017-02-16 DIAGNOSIS — J449 Chronic obstructive pulmonary disease, unspecified: Secondary | ICD-10-CM | POA: Diagnosis present

## 2017-02-16 DIAGNOSIS — Z9221 Personal history of antineoplastic chemotherapy: Secondary | ICD-10-CM | POA: Diagnosis not present

## 2017-02-16 DIAGNOSIS — D3502 Benign neoplasm of left adrenal gland: Secondary | ICD-10-CM | POA: Diagnosis present

## 2017-02-16 DIAGNOSIS — Z7984 Long term (current) use of oral hypoglycemic drugs: Secondary | ICD-10-CM | POA: Diagnosis not present

## 2017-02-16 LAB — COMPREHENSIVE METABOLIC PANEL
ALK PHOS: 63 U/L (ref 38–126)
ALK PHOS: 64 U/L (ref 38–126)
ALT: 69 U/L — ABNORMAL HIGH (ref 14–54)
ALT: 71 U/L — AB (ref 14–54)
ANION GAP: 14 (ref 5–15)
AST: 48 U/L — ABNORMAL HIGH (ref 15–41)
AST: 52 U/L — ABNORMAL HIGH (ref 15–41)
Albumin: 3.2 g/dL — ABNORMAL LOW (ref 3.5–5.0)
Albumin: 3.3 g/dL — ABNORMAL LOW (ref 3.5–5.0)
Anion gap: 12 (ref 5–15)
BILIRUBIN TOTAL: 0.6 mg/dL (ref 0.3–1.2)
BILIRUBIN TOTAL: 0.5 mg/dL (ref 0.3–1.2)
BUN: 31 mg/dL — AB (ref 6–20)
BUN: 41 mg/dL — ABNORMAL HIGH (ref 6–20)
CALCIUM: 8.5 mg/dL — AB (ref 8.9–10.3)
CALCIUM: 9 mg/dL (ref 8.9–10.3)
CO2: 24 mmol/L (ref 22–32)
CO2: 25 mmol/L (ref 22–32)
CREATININE: 1.05 mg/dL — AB (ref 0.44–1.00)
Chloride: 101 mmol/L (ref 101–111)
Chloride: 102 mmol/L (ref 101–111)
Creatinine, Ser: 1.37 mg/dL — ABNORMAL HIGH (ref 0.44–1.00)
GFR calc Af Amer: 58 mL/min — ABNORMAL LOW (ref >60)
GFR calc non Af Amer: 50 mL/min — ABNORMAL LOW (ref >60)
GFR, EST AFRICAN AMERICAN: 42 mL/min — AB (ref >60)
GFR, EST NON AFRICAN AMERICAN: 36 mL/min — AB (ref >60)
GLUCOSE: 232 mg/dL — AB (ref 65–99)
Glucose, Bld: 115 mg/dL — ABNORMAL HIGH (ref 65–99)
POTASSIUM: 2.8 mmol/L — AB (ref 3.5–5.1)
Potassium: 3.2 mmol/L — ABNORMAL LOW (ref 3.5–5.1)
SODIUM: 138 mmol/L (ref 135–145)
Sodium: 140 mmol/L (ref 135–145)
TOTAL PROTEIN: 6.5 g/dL (ref 6.5–8.1)
TOTAL PROTEIN: 6.4 g/dL — AB (ref 6.5–8.1)

## 2017-02-16 LAB — I-STAT CHEM 8, ED
BUN: 48 mg/dL — ABNORMAL HIGH (ref 6–20)
Calcium, Ion: 0.99 mmol/L — ABNORMAL LOW (ref 1.15–1.40)
Chloride: 108 mmol/L (ref 101–111)
Creatinine, Ser: 1.4 mg/dL — ABNORMAL HIGH (ref 0.44–1.00)
GLUCOSE: 115 mg/dL — AB (ref 65–99)
HEMATOCRIT: 32 % — AB (ref 36.0–46.0)
HEMOGLOBIN: 10.9 g/dL — AB (ref 12.0–15.0)
POTASSIUM: 3.4 mmol/L — AB (ref 3.5–5.1)
Sodium: 140 mmol/L (ref 135–145)
TCO2: 24 mmol/L (ref 22–32)

## 2017-02-16 LAB — URINALYSIS, ROUTINE W REFLEX MICROSCOPIC
Bilirubin Urine: NEGATIVE
Glucose, UA: NEGATIVE mg/dL
KETONES UR: NEGATIVE mg/dL
Nitrite: NEGATIVE
PROTEIN: NEGATIVE mg/dL
Specific Gravity, Urine: 1.011 (ref 1.005–1.030)
pH: 6 (ref 5.0–8.0)

## 2017-02-16 LAB — CBC WITH DIFFERENTIAL/PLATELET
BASOS ABS: 0 10*3/uL (ref 0.0–0.1)
Band Neutrophils: 5 %
Basophils Relative: 0 %
Blasts: 0 %
EOS PCT: 0 %
Eosinophils Absolute: 0 10*3/uL (ref 0.0–0.7)
HEMATOCRIT: 34.1 % — AB (ref 36.0–46.0)
Hemoglobin: 10.9 g/dL — ABNORMAL LOW (ref 12.0–15.0)
LYMPHS ABS: 0.9 10*3/uL (ref 0.7–4.0)
Lymphocytes Relative: 3 %
MCH: 26.7 pg (ref 26.0–34.0)
MCHC: 32 g/dL (ref 30.0–36.0)
MCV: 83.6 fL (ref 78.0–100.0)
METAMYELOCYTES PCT: 7 %
MONOS PCT: 3 %
Monocytes Absolute: 0.9 10*3/uL (ref 0.1–1.0)
Myelocytes: 4 %
NEUTROS ABS: 28.5 10*3/uL — AB (ref 1.7–7.7)
NRBC: 0 /100{WBCs}
Neutrophils Relative %: 78 %
Other: 0 %
Platelets: 240 10*3/uL (ref 150–400)
Promyelocytes Absolute: 0 %
RBC: 4.08 MIL/uL (ref 3.87–5.11)
RDW: 17.3 % — AB (ref 11.5–15.5)
WBC: 30.3 10*3/uL — AB (ref 4.0–10.5)

## 2017-02-16 LAB — BASIC METABOLIC PANEL
Anion gap: 10 (ref 5–15)
BUN: 33 mg/dL — ABNORMAL HIGH (ref 6–20)
CHLORIDE: 103 mmol/L (ref 101–111)
CO2: 27 mmol/L (ref 22–32)
Calcium: 8.3 mg/dL — ABNORMAL LOW (ref 8.9–10.3)
Creatinine, Ser: 1.34 mg/dL — ABNORMAL HIGH (ref 0.44–1.00)
GFR calc Af Amer: 43 mL/min — ABNORMAL LOW (ref >60)
GFR calc non Af Amer: 37 mL/min — ABNORMAL LOW (ref >60)
Glucose, Bld: 86 mg/dL (ref 65–99)
POTASSIUM: 3.3 mmol/L — AB (ref 3.5–5.1)
SODIUM: 140 mmol/L (ref 135–145)

## 2017-02-16 LAB — CBC
HCT: 35.1 % — ABNORMAL LOW (ref 36.0–46.0)
HEMOGLOBIN: 11.1 g/dL — AB (ref 12.0–15.0)
MCH: 26.6 pg (ref 26.0–34.0)
MCHC: 31.6 g/dL (ref 30.0–36.0)
MCV: 84 fL (ref 78.0–100.0)
PLATELETS: 228 10*3/uL (ref 150–400)
RBC: 4.18 MIL/uL (ref 3.87–5.11)
RDW: 17.3 % — AB (ref 11.5–15.5)
WBC: 27.4 10*3/uL — ABNORMAL HIGH (ref 4.0–10.5)

## 2017-02-16 LAB — GLUCOSE, CAPILLARY
GLUCOSE-CAPILLARY: 176 mg/dL — AB (ref 65–99)
GLUCOSE-CAPILLARY: 89 mg/dL (ref 65–99)
Glucose-Capillary: 229 mg/dL — ABNORMAL HIGH (ref 65–99)
Glucose-Capillary: 252 mg/dL — ABNORMAL HIGH (ref 65–99)
Glucose-Capillary: 146 mg/dL — ABNORMAL HIGH (ref 65–99)

## 2017-02-16 LAB — I-STAT TROPONIN, ED: Troponin i, poc: 0.04 ng/mL (ref 0.00–0.08)

## 2017-02-16 MED ORDER — CIPROFLOXACIN IN D5W 400 MG/200ML IV SOLN
400.0000 mg | Freq: Once | INTRAVENOUS | Status: AC
  Administered 2017-02-16: 400 mg via INTRAVENOUS
  Filled 2017-02-16: qty 200

## 2017-02-16 MED ORDER — METRONIDAZOLE IN NACL 5-0.79 MG/ML-% IV SOLN
500.0000 mg | Freq: Three times a day (TID) | INTRAVENOUS | Status: DC
  Administered 2017-02-16 – 2017-02-17 (×3): 500 mg via INTRAVENOUS
  Filled 2017-02-16 (×5): qty 100

## 2017-02-16 MED ORDER — SODIUM CHLORIDE 0.9 % IV BOLUS (SEPSIS)
1000.0000 mL | Freq: Once | INTRAVENOUS | Status: AC
  Administered 2017-02-16: 1000 mL via INTRAVENOUS

## 2017-02-16 MED ORDER — SIMVASTATIN 10 MG PO TABS
10.0000 mg | ORAL_TABLET | Freq: Every day | ORAL | Status: DC
  Administered 2017-02-16 – 2017-02-19 (×3): 10 mg via ORAL
  Filled 2017-02-16 (×3): qty 1

## 2017-02-16 MED ORDER — INSULIN ASPART 100 UNIT/ML ~~LOC~~ SOLN
0.0000 [IU] | Freq: Three times a day (TID) | SUBCUTANEOUS | Status: DC
  Administered 2017-02-16: 5 [IU] via SUBCUTANEOUS
  Administered 2017-02-16: 3 [IU] via SUBCUTANEOUS
  Administered 2017-02-16 – 2017-02-17 (×2): 1 [IU] via SUBCUTANEOUS
  Administered 2017-02-17 (×2): 2 [IU] via SUBCUTANEOUS
  Administered 2017-02-18: 5 [IU] via SUBCUTANEOUS
  Administered 2017-02-18: 2 [IU] via SUBCUTANEOUS
  Administered 2017-02-19: 5 [IU] via SUBCUTANEOUS
  Administered 2017-02-19 – 2017-02-20 (×3): 2 [IU] via SUBCUTANEOUS
  Administered 2017-02-20: 5 [IU] via SUBCUTANEOUS
  Administered 2017-02-20: 1 [IU] via SUBCUTANEOUS

## 2017-02-16 MED ORDER — POTASSIUM CHLORIDE IN NACL 20-0.9 MEQ/L-% IV SOLN
INTRAVENOUS | Status: DC
  Administered 2017-02-16: 07:00:00 via INTRAVENOUS
  Filled 2017-02-16: qty 1000

## 2017-02-16 MED ORDER — POTASSIUM CHLORIDE 10 MEQ/100ML IV SOLN
10.0000 meq | INTRAVENOUS | Status: AC
  Administered 2017-02-16: 10 meq via INTRAVENOUS
  Filled 2017-02-16: qty 100

## 2017-02-16 MED ORDER — ALBUTEROL SULFATE (2.5 MG/3ML) 0.083% IN NEBU: 3.0000 mL | INHALATION_SOLUTION | Freq: Four times a day (QID) | RESPIRATORY_TRACT | Status: DC | PRN

## 2017-02-16 MED ORDER — IOPAMIDOL (ISOVUE-300) INJECTION 61%
80.0000 mL | Freq: Once | INTRAVENOUS | Status: AC | PRN
  Administered 2017-02-16: 80 mL via INTRAVENOUS

## 2017-02-16 MED ORDER — POTASSIUM CHLORIDE IN NACL 20-0.9 MEQ/L-% IV SOLN
INTRAVENOUS | Status: AC
  Filled 2017-02-16: qty 1000

## 2017-02-16 MED ORDER — SODIUM CHLORIDE 0.9 % IV SOLN
INTRAVENOUS | Status: DC
  Administered 2017-02-18: 13:00:00 via INTRAVENOUS

## 2017-02-16 MED ORDER — FLUCONAZOLE 200 MG PO TABS
200.0000 mg | ORAL_TABLET | Freq: Every day | ORAL | Status: DC
  Administered 2017-02-16 – 2017-02-20 (×5): 200 mg via ORAL
  Filled 2017-02-16 (×5): qty 1

## 2017-02-16 MED ORDER — METOPROLOL SUCCINATE ER 50 MG PO TB24
100.0000 mg | ORAL_TABLET | Freq: Every day | ORAL | Status: DC
  Administered 2017-02-16 – 2017-02-20 (×5): 100 mg via ORAL
  Filled 2017-02-16 (×5): qty 2

## 2017-02-16 MED ORDER — DEXAMETHASONE 4 MG PO TABS: 8.0000 mg | ORAL_TABLET | Freq: Every day | ORAL | Status: DC

## 2017-02-16 MED ORDER — HYDROCHLOROTHIAZIDE 12.5 MG PO CAPS: 12.5000 mg | ORAL_CAPSULE | Freq: Every day | ORAL | Status: DC

## 2017-02-16 MED ORDER — ACETAMINOPHEN 325 MG PO TABS
650.0000 mg | ORAL_TABLET | Freq: Four times a day (QID) | ORAL | Status: DC | PRN
  Administered 2017-02-16: 650 mg via ORAL
  Filled 2017-02-16: qty 2

## 2017-02-16 MED ORDER — POTASSIUM CHLORIDE 10 MEQ/100ML IV SOLN
10.0000 meq | INTRAVENOUS | Status: AC
  Administered 2017-02-16 (×2): 10 meq via INTRAVENOUS
  Filled 2017-02-16 (×2): qty 100

## 2017-02-16 MED ORDER — LOPERAMIDE HCL 2 MG PO CAPS: 2.0000 mg | ORAL_CAPSULE | ORAL | Status: DC | PRN

## 2017-02-16 MED ORDER — ENOXAPARIN SODIUM 40 MG/0.4ML ~~LOC~~ SOLN
40.0000 mg | SUBCUTANEOUS | Status: DC
  Administered 2017-02-16 – 2017-02-20 (×5): 40 mg via SUBCUTANEOUS
  Filled 2017-02-16 (×5): qty 0.4

## 2017-02-16 MED ORDER — METRONIDAZOLE IN NACL 5-0.79 MG/ML-% IV SOLN
500.0000 mg | Freq: Once | INTRAVENOUS | Status: AC
  Administered 2017-02-16: 500 mg via INTRAVENOUS
  Filled 2017-02-16: qty 100

## 2017-02-16 MED ORDER — CIPROFLOXACIN IN D5W 400 MG/200ML IV SOLN
400.0000 mg | Freq: Two times a day (BID) | INTRAVENOUS | Status: DC
  Administered 2017-02-16 – 2017-02-17 (×3): 400 mg via INTRAVENOUS
  Filled 2017-02-16 (×3): qty 200

## 2017-02-16 MED ORDER — AMLODIPINE BESYLATE 10 MG PO TABS
10.0000 mg | ORAL_TABLET | Freq: Every day | ORAL | Status: DC
  Administered 2017-02-16 – 2017-02-20 (×5): 10 mg via ORAL
  Filled 2017-02-16 (×5): qty 1

## 2017-02-16 MED ORDER — ASPIRIN 81 MG PO CHEW
81.0000 mg | CHEWABLE_TABLET | Freq: Every day | ORAL | Status: DC
  Administered 2017-02-16 – 2017-02-20 (×5): 81 mg via ORAL
  Filled 2017-02-16 (×5): qty 1

## 2017-02-16 MED ORDER — HYDROMORPHONE HCL 1 MG/ML IJ SOLN
INTRAMUSCULAR | Status: AC
  Administered 2017-02-16: 1 mg
  Filled 2017-02-16: qty 1

## 2017-02-16 MED ORDER — LISINOPRIL 20 MG PO TABS: 20.0000 mg | ORAL_TABLET | Freq: Every day | ORAL | Status: DC

## 2017-02-16 MED ORDER — MAGIC MOUTHWASH
5.0000 mL | Freq: Three times a day (TID) | ORAL | Status: DC | PRN
  Filled 2017-02-16: qty 5

## 2017-02-16 MED ORDER — SODIUM CHLORIDE 0.9% FLUSH
10.0000 mL | INTRAVENOUS | Status: DC | PRN
  Administered 2017-02-20 (×2): 10 mL
  Filled 2017-02-16 (×2): qty 40

## 2017-02-16 MED ORDER — ACETAMINOPHEN 650 MG RE SUPP: 650.0000 mg | Freq: Four times a day (QID) | RECTAL | Status: DC | PRN

## 2017-02-16 MED ORDER — IOPAMIDOL (ISOVUE-300) INJECTION 61%
INTRAVENOUS | Status: AC
  Filled 2017-02-16: qty 100

## 2017-02-16 MED ORDER — GLIMEPIRIDE 1 MG PO TABS
1.0000 mg | ORAL_TABLET | Freq: Every day | ORAL | Status: DC
  Administered 2017-02-16: 1 mg via ORAL
  Filled 2017-02-16: qty 1

## 2017-02-16 MED ORDER — DEXAMETHASONE 4 MG PO TABS
4.0000 mg | ORAL_TABLET | Freq: Every day | ORAL | Status: DC
  Administered 2017-02-16 – 2017-02-20 (×5): 4 mg via ORAL
  Filled 2017-02-16 (×5): qty 1

## 2017-02-16 NOTE — H&P (Signed)
TRH H&P   Patient Demographics:    Laurie Collins, is a 76 y.o. female  MRN: [id number]   DOB - 04-06-1941  Admit Date - 02/15/2017  Outpatient Primary MD for the patient is [name], MD  Referring MD/NP/PA: Shirlyn Goltz  Outpatient Specialists:   Dr. Virgel Paling  Patient coming from: home  Chief Complaint  Patient presents with  . Weakness      HPI:    Laurie Collins  is a 76 y.o. female, w hypertension, dm2, Copd, breast cancer c/o diarrhea for about 1 week.  "2-3" per day. "loose stool"  Denies fever, chills, cough, cp, palp, sob, abd pain, constipation, brbpr, black stool  Pt recalls being on some abx within in the past 3 months.     In ED,  Wbc 30.3, Hgb 10.9, Plt 240 K 2.8, Bun 41, Creatinine 1.37 Ast 52, Alt 69  CT abd/pelvis IMPRESSION: 1. Minimal apparent thickening of the rectal wall, likely related to underdistention. Mild inflammatory changes or underline lesion is not entirely excluded. Correlation with clinical exam recommended. No bowel obstruction. Normal appendix. 2. No intra-abdominal or pelvic adenopathy or evidence of metastatic disease within the abdomen or pelvis. 3. Subcentimeter left adrenal hypodense lesion, similar to prior CT, likely an adenoma. 4.  Aortic Atherosclerosis (ICD10-I70.0).  Pt will be admitted for diarrhea and leukocytosis and colitis.    Review of systems:    In addition to the HPI above, No Fever-chills, No Headache, No changes with Vision or hearing, No problems swallowing food or Liquids, No Chest pain, Cough or Shortness of Breath, No Abdominal pain, No Nausea or Vommitting, No Blood in stool or Urine, No dysuria, No new skin rashes or bruises, No new joints pains-aches,  No new weakness, tingling, numbness in any extremity, No recent weight gain or loss, No polyuria, polydypsia or polyphagia, No  significant Mental Stressors.  A full 10 point Review of Systems was done, except as stated above, all other Review of Systems were negative.   With Past History of the following :    Past Medical History:  Diagnosis Date  . Anxiety   . Cancer ([place])    breast  . COPD (chronic obstructive pulmonary disease) ([place])   . Depression   . Diabetes mellitus without complication ([place])   . Dyspnea   . Hypertension   . PONV (postoperative nausea and vomiting)    very,very,very sick from anesthesia  . Tuberculosis    1962      Past Surgical History:  Procedure Laterality Date  . BREAST SURGERY     5 breast lumpectomy  . PORTACATH PLACEMENT Right 09/24/2016   Procedure: INSERTION PORT-A-CATH;  Surgeon: [name], MD;  Location: [facility];  Service: General;  Laterality: Right;      Social History:     Social History  Substance Use Topics  . Smoking status: Former Smoker  Packs/day: 2.00    Years: 25.00    Types: Cigarettes    Quit date: 2013  . Smokeless tobacco: Never Used  . Alcohol use No     Lives - at home    Family History :     Family History  Problem Relation Age of Onset  . Stomach cancer Mother 39       d.67 from metastatic disease  . Cancer Maternal Aunt        Unspecified type  . Cancer Maternal Uncle        Unspecified type      Home Medications:   Prior to Admission medications   Medication Sig Start Date End Date Taking? Authorizing Provider  amLODipine (NORVASC) 10 MG tablet Take 10 mg by mouth daily.   Yes [provider]  aspirin 81 MG chewable tablet Chew 81 mg by mouth daily.   Yes [provider]  Cholecalciferol (VITAMIN D) 2000 units tablet Take 2,000 Units by mouth daily.   Yes [provider]  dexamethasone (DECADRON) 4 MG tablet Take 2 tablets (8 mg total) by mouth daily. 12/25/16  Yes Causey, Charlestine Massed, NP  fluconazole (DIFLUCAN) 200 MG tablet Take 1 tablet (200 mg total) by mouth daily. 02/12/17   Yes Causey, Charlestine Massed, NP  glimepiride (AMARYL) 1 MG tablet Take 1 mg by mouth daily with breakfast.   Yes [provider]  hydrochlorothiazide (MICROZIDE) 12.5 MG capsule Take 12.5 mg by mouth daily.   Yes [provider]  lisinopril (PRINIVIL,ZESTRIL) 20 MG tablet Take 20 mg by mouth daily.   Yes [provider]  loperamide (IMODIUM) 2 MG capsule Take 2 mg by mouth every 4 (four) hours as needed for diarrhea or loose stools.    Yes [provider]  magic mouthwash SOLN Take 5 mLs by mouth 3 (three) times daily as needed for mouth pain. 02/12/17  Yes Causey, Charlestine Massed, NP  metFORMIN (GLUCOPHAGE) 1000 MG tablet Take 1,000 mg by mouth 2 (two) times daily with a meal.   Yes [provider]  metoprolol succinate (TOPROL-XL) 100 MG 24 hr tablet Take 100 mg by mouth daily. 07/16/16  Yes [provider]  predniSONE (DELTASONE) 20 MG tablet Take 1 tablet (20 mg total) by mouth daily with breakfast. 01/05/17  Yes Gherghe, Vella Redhead, MD  simvastatin (ZOCOR) 10 MG tablet Take 10 mg by mouth at bedtime. 07/16/16  Yes [provider]  Triamcinolone Acetonide (TRIAMCINOLONE 0.1 % CREAM : EUCERIN) CREA Apply 1 application topically 2 (two) times daily as needed. 12/25/16  Yes Causey, Charlestine Massed, NP  albuterol (PROVENTIL HFA;VENTOLIN HFA) 108 (90 Base) MCG/ACT inhaler Inhale 2 puffs into the lungs every 6 (six) hours as needed for wheezing or shortness of breath. 01/05/17   Caren Griffins, MD  tiotropium (SPIRIVA HANDIHALER) 18 MCG inhalation capsule Place 1 capsule (18 mcg total) into inhaler and inhale daily. Patient not taking: Reported on 02/16/2017 01/05/17 01/05/18  Caren Griffins, MD     Allergies:     Allergies  Allergen Reactions  . Doxycycline Anaphylaxis  . Penicillins Hives and Swelling    Has patient had a PCN reaction causing immediate rash, facial/tongue/throat swelling, SOB or lightheadedness with hypotension:  Yes Has patient had a PCN reaction causing severe rash involving mucus membranes or skin necrosis: No Has patient had a PCN reaction that required hospitalization: No Has patient had a PCN reaction occurring within the last 10 years: No If all  of the above answers are "NO", then may proceed with Cephalosporin use.      Physical Exam:   Vitals  Blood pressure (!) 99/55, pulse 95, temperature 97.8 F (36.6 C), temperature source Oral, resp. rate 18, height 5\' 3"  (1.6 m), weight 73.5 kg (162 lb), SpO2 99 %.   1. General lying in bed in NAD,    2. Normal affect and insight, Not Suicidal or Homicidal, Awake Alert, Oriented X 3.  3. No F.N deficits, ALL C.Nerves Intact, Strength 5/5 all 4 extremities, Sensation intact all 4 extremities, Plantars down going.  4. Ears and Eyes appear Normal, Conjunctivae clear, PERRLA. Moist Oral Mucosa.  5. Supple Neck, No JVD, No cervical lymphadenopathy appriciated, No Carotid Bruits.  6. Symmetrical Chest wall movement, Good air movement bilaterally, CTAB.  7. RRR, No Gallops, Rubs or Murmurs, No Parasternal Heave.  8. Positive Bowel Sounds, Abdomen Soft, No tenderness, No organomegaly appriciated,No rebound -guarding or rigidity.  9.  No Cyanosis, Normal Skin Turgor, No Skin Rash or Bruise.  10. Good muscle tone,  joints appear normal , no effusions, Normal ROM.  11. No Palpable Lymph Nodes in Neck or Axillae     Data Review:    CBC  Recent Labs Lab 02/12/17 0949 02/15/17 2340 02/16/17 0004  WBC 10.9* 30.3*  --   HGB 11.6 10.9* 10.9*  HCT 36.1 34.1* 32.0*  PLT 228 240  --   MCV 83.4 83.6  --   MCH 26.8 26.7  --   MCHC 32.1 32.0  --   RDW 17.4* 17.3*  --   LYMPHSABS 0.9 0.9  --   MONOABS 0.8 0.9  --   EOSABS 0.0 0.0  --   BASOSABS 0.3* 0.0  --    ------------------------------------------------------------------------------------------------------------------  Chemistries   Recent Labs Lab 02/12/17 0949 02/15/17 2340  02/16/17 0004  NA 139 140 140  K 4.0 2.8* 3.4*  CL  --  102 108  CO2 23 24  --   GLUCOSE 203* 115* 115*  BUN 60.3* 41* 48*  CREATININE 1.6* 1.37* 1.40*  CALCIUM 9.8 9.0  --   AST 41* 52*  --   ALT 70* 69*  --   ALKPHOS 70 64  --   BILITOT 0.48 0.5  --    ------------------------------------------------------------------------------------------------------------------ estimated creatinine clearance is 32.8 mL/min (A) (by C-G formula based on SCr of 1.4 mg/dL (H)). ------------------------------------------------------------------------------------------------------------------ No results for input(s): TSH, T4TOTAL, T3FREE, THYROIDAB in the last 72 hours.  Invalid input(s): FREET3  Coagulation profile No results for input(s): INR, PROTIME in the last 168 hours. ------------------------------------------------------------------------------------------------------------------- No results for input(s): DDIMER in the last 72 hours. -------------------------------------------------------------------------------------------------------------------  Cardiac Enzymes No results for input(s): CKMB, TROPONINI, MYOGLOBIN in the last 168 hours.  Invalid input(s): CK ------------------------------------------------------------------------------------------------------------------    Component Value Date/Time   BNP 366.0 (H) 01/03/2017 0343     ---------------------------------------------------------------------------------------------------------------  Urinalysis No results found for: COLORURINE, APPEARANCEUR, LABSPEC, PHURINE, GLUCOSEU, HGBUR, BILIRUBINUR, KETONESUR, PROTEINUR, UROBILINOGEN, NITRITE, LEUKOCYTESUR  ----------------------------------------------------------------------------------------------------------------   Imaging Results:    Dg Chest 2 View  Result Date: 02/15/2017 CLINICAL DATA:  75 year old female with weakness. EXAM: CHEST  2 VIEW COMPARISON:  Chest CT  dated 01/03/2017 FINDINGS: Right pectoral Port-A-Cath with the tip over central SVC. The lungs are clear. There is no pleural effusion or pneumothorax. The cardiac silhouette is within normal limits. No acute osseous pathology. IMPRESSION: No active cardiopulmonary disease. Electronically Signed   By: Anner Crete M.D.   On: 02/15/2017 23:35  Ct Abdomen Pelvis W Contrast  Result Date: 02/16/2017 CLINICAL DATA:  76 year old female with abdominal distention and pain. History of breast cancer. EXAM: CT ABDOMEN AND PELVIS WITH CONTRAST TECHNIQUE: Multidetector CT imaging of the abdomen and pelvis was performed using the standard protocol following bolus administration of intravenous contrast. CONTRAST:  37mL ISOVUE-300 IOPAMIDOL (ISOVUE-300) INJECTION 61% COMPARISON:  Abdominal CT dated 09/20/2016 FINDINGS: Lower chest: A 3 mm calcified granuloma in the left posterior lung base. The visualized lung bases are otherwise clear. No intra-abdominal free air or free fluid. Hepatobiliary: No focal liver abnormality is seen. No gallstones, gallbladder wall thickening, or biliary dilatation. Pancreas: Unremarkable. No pancreatic ductal dilatation or surrounding inflammatory changes. Spleen: Normal in size without focal abnormality. Adrenals/Urinary Tract: Subcentimeter hypodense nodule in the left adrenal gland, indeterminate, likely an adenoma. The right adrenal gland is unremarkable. The visualized ureters and urinary bladder appear unremarkable. Stomach/Bowel: Minimal thickened appearance of the rectal wall, likely related to underdistention. Mild proctitis or the possibility of underlying mass is less likely but not entirely excluded. Correlation with clinical exam recommended. There is a 1.5 cm duodenal diverticulum. There is no bowel obstruction or active inflammation. Normal appendix. Vascular/Lymphatic: There is moderate aortoiliac atherosclerotic disease. No aneurysmal dilatation or evidence of dissection. The  IVC is grossly unremarkable. The SMV, splenic vein, and main portal vein are patent. No portal venous gas. There is no adenopathy. Reproductive: The uterus is anteverted. Small calcified posterior fundal fibroid noted. The ovaries are grossly unremarkable. Other: None Musculoskeletal: There is degenerative changes of the spine. No acute osseous pathology. No CT evidence of osseous metastatic disease. IMPRESSION: 1. Minimal apparent thickening of the rectal wall, likely related to underdistention. Mild inflammatory changes or underline lesion is not entirely excluded. Correlation with clinical exam recommended. No bowel obstruction. Normal appendix. 2. No intra-abdominal or pelvic adenopathy or evidence of metastatic disease within the abdomen or pelvis. 3. Subcentimeter left adrenal hypodense lesion, similar to prior CT, likely an adenoma. 4.  Aortic Atherosclerosis (ICD10-I70.0). Electronically Signed   By: Anner Crete M.D.   On: 02/16/2017 01:19   Ct L-spine No Charge  Result Date: 02/16/2017 CLINICAL DATA:  Initial evaluation for back pain. EXAM: CT LUMBAR SPINE WITHOUT CONTRAST TECHNIQUE: Multidetector CT imaging of the lumbar spine was performed without intravenous contrast administration. Multiplanar CT image reconstructions were also generated. COMPARISON:  None available. FINDINGS: Segmentation: Normal segmentation. Lowest well-formed disc labeled the L5-S1 level. Alignment: Trace anterolisthesis of L4 on L5 and L5 on S1. Vertebral bodies otherwise normally aligned with preservation of the normal lumbar lordosis. Vertebrae: Vertebral body heights maintained. No evidence for acute or chronic fracture. No discrete lytic or blastic osseous lesions. Visualized sacrum and pelvis intact. SI joints fairly symmetric and within normal limits. Paraspinal and other soft tissues: Chronic atrophy noted within the lower paraspinous musculature. Paraspinous soft tissues demonstrate no acute abnormality. Calcified  uterine fibroid noted. Aortic atherosclerosis. Visualized visceral structures otherwise unremarkable. Disc levels: L1-2: Minimal disc bulge. Mild bilateral facet hypertrophy. No stenosis. L2-3: Minimal annular disc bulge. Moderate facet hypertrophy, worse on the right. No significant canal stenosis. Mild bilateral foraminal narrowing, slightly greater on the right. L3-4: Mild diffuse disc bulge. Moderate facet and ligamentum flavum hypertrophy. No significant canal stenosis. Mild bilateral L3 foraminal narrowing. L4-5: Trace anterolisthesis. Broad posterior disc bulge. Advanced facet arthrosis. Resultant mild to moderate canal and subarticular stenosis. Mild to moderate bilateral foraminal narrowing. L5-S1: Trace anterolisthesis. No significant disc bulge. Advanced facet arthrosis. No significant stenosis. IMPRESSION:  1. No acute abnormality within the lumbar spine. 2. Trace anterolisthesis of L4 on L5 and L5 on S1 with associated advanced facet arthropathy. Finding could serve as a source for low back pain. 3. Mild to moderate canal and bilateral foraminal stenosis at L4-5 due to disc bulge and facet disease. 4. Additional more mild multilevel degenerative spondylolysis as above. Electronically Signed   By: Jeannine Boga M.D.   On: 02/16/2017 01:25      Assessment & Plan:    Principal Problem:   Colitis Active Problems:   Hypokalemia   Abnormal liver function    Leukocytosis Colitis Blood culture x2 Gi pathogen panel , C. Diff cipro iv, flagyl iv   Hypokalemia Replete Check cmp in am  Abnormal liver function Acute hepatitis panel  Check cmp in am  Dm2 fsbs ac and qhs,  ISS HOLD METFORMIN due to Diarrhea  Hypertension Cont lisinopril, cont hydrochorothiazide, cont metoprolol, cont amlodipine  Hyperlipidemia Cont simvastatin  Thrush Continue magic mouthwash    DVT Prophylaxis Lovenox - SCDs  AM Labs Ordered, also please review Full Orders  Family Communication:  Admission, patients condition and plan of care including tests being ordered have been discussed with the patient who indicate understanding and agree with the plan and Code Status.  Code Status FULL CODE  Likely DC to  home  Condition GUARDED    Consults called: none  Admission status: inpatient   Time spent in minutes : 45   Jani Gravel M.D on 02/16/2017 at 1:40 AM  Between 7am to 7pm - Pager - [phone]   After 7pm go to [email or web address] - password [facility]  [facility] - Office  [phone]

## 2017-02-16 NOTE — ED Notes (Signed)
Notified EDP,Yao,MD.,Pt. I-stat Chem 8 results potassium 3.4, hemoglobin 10.9, and I-stat CG4 Lactic acid results 3.85. RN,Holly made aware.

## 2017-02-16 NOTE — Progress Notes (Signed)
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 Presented to the emergency room due to diarrhea for few days along with bilateral leg weakness and trouble walking for about a week she was just seen in the cancer center 3 days prior to admission, started on fluconazole for possible thrush. n the emergency room she was noted to have a white count of 30,000, with elevated lactic acid, hypokalemia, unremarkable CT abdomen and pelvis   Assessment & Plan:   1.Diarrhea -I suspect this is primarily secondary to her chemotherapy, both Cytoxan and Adriamycin can cause diarrhea and mucositis, CT does not show evidence of colitis -WBC significantly elevated however she received Neulasta 2 weeks ago which could be contributing -Follow-up C. Difficile PCR and GI pathogen panel, if negative will stop antibiotics -Supportive care, IV fluids today -Advance diet, abdominal exam is benign  2. Weakness -Also likely secondary to dehydration, she does describe some symptoms of peripheral neuropathy which are likely again related to  Chemotherapy -Neuro exam is nonfocal -CT L-spine notes some degenerative changes, no acute findings -IV fluids, physical therapy consult requested  3. Bilateral breast cancer with stage IIIB on the left -recent chemotherapy -We will notify Dr. Sonny Dandy via Epic  4. Hypokalemia -Due to recent diarrhea, replace  5. Mild transaminitis -could also be from chemotherapy, monitor -CT unremarkable for any hepatobiliary findings  6. DM -on Glimepiride nd metformin at home -Continue sliding-scale insulin -Hold oral hypoglycemics:  Rest chronic medical problems stable as needed dictated by Dr. Maudie Mercury  this morning  DVT prophylaxis:Lovenox Code Status: full code Family Communication:no family at bedside Disposition Plan: home pending improvement     Procedures:   Antimicrobials:  Cipro and Flagyl 11/3->  Subjective: -feeling a little better,diarrhea improving denies any abdominal pain, has chronic back pain  Objective: Vitals:   02/16/17 0230 02/16/17 0245 02/16/17 0300 02/16/17 0422  BP: 124/82  129/83 (!) 105/58  Pulse: 65 65 66 64  Resp: 14 (!) 9 11 12   Temp:   97.8 F (36.6 C) 97.8 F (36.6 C)  TempSrc:   Oral Oral  SpO2: 95% 100% 96% 100%  Weight:    78.3 kg (172 lb 9.9 oz)  Height:        Intake/Output Summary (Last 24 hours) at 02/16/2017 1241 Last data filed at 02/16/2017 0959 Gross per 24 hour  Intake 2940 ml  Output -  Net 2940 ml   Filed Weights   02/15/17 2246 02/16/17 0422  Weight: 73.5 kg (162 lb) 78.3 kg (172 lb 9.9 oz)    Examination:  General exam: Appears calm and comfortable  Respiratory system: Clear to auscultation. Respiratory effort normal. Cardiovascular system: S1 & S2 heard, RRR. No JVD, murmurs, rubs, gallops Gastrointestinal system: Abdomen is nondistended, soft and nontender.Normal bowel sounds heard. Central nervous system: Alert and oriented. No focal neurological deficits. Extremities: Symmetric 5 x 5 power. Skin: No rashes, lesions or ulcers Psychiatry: Judgement and insight appear normal. Mood & affect appropriate.     Data Reviewed:   CBC: Recent Labs  Lab 02/12/17 0949 02/15/17 2340 02/16/17 0004 02/16/17 0657  WBC 10.9* 30.3*  --  27.4*  NEUTROABS 8.9* 28.5*  --   --   HGB 11.6 10.9* 10.9* 11.1*  HCT 36.1 34.1* 32.0* 35.1*  MCV 83.4 83.6  --  84.0  PLT 228 240  --  147   Basic Metabolic Panel: Recent Labs  Lab 02/12/17 0949 02/15/17 2340 02/16/17 0004 02/16/17 0657  NA 139 140 140 138  K 4.0 2.8* 3.4* 3.2*  CL  --  102 108 101  CO2 23 24  --  25  GLUCOSE 203* 115* 115* 232*  BUN 60.3* 41* 48* 31*   CREATININE 1.6* 1.37* 1.40* 1.05*  CALCIUM 9.8 9.0  --  8.5*   GFR: Estimated Creatinine Clearance: 45.2 mL/min (A) (by C-G formula based on SCr of 1.05 mg/dL (H)). Liver Function Tests: Recent Labs  Lab 02/12/17 0949 02/15/17 2340 02/16/17 0657  AST 41* 52* 48*  ALT 70* 69* 71*  ALKPHOS 70 64 63  BILITOT 0.48 0.5 0.6  PROT 7.2 6.4* 6.5  ALBUMIN 3.1* 3.2* 3.3*   No results for input(s): LIPASE, AMYLASE in the last 168 hours. No results for input(s): AMMONIA in the last 168 hours. Coagulation Profile: No results for input(s): INR, PROTIME in the last 168 hours. Cardiac Enzymes: No results for input(s): CKTOTAL, CKMB, CKMBINDEX, TROPONINI in the last 168 hours. BNP (last 3 results) No results for input(s): PROBNP in the last 8760 hours. HbA1C: No results for input(s): HGBA1C in the last 72 hours. CBG: Recent Labs  Lab 02/15/17 2258 02/16/17 0418 02/16/17 0809 02/16/17 1119  GLUCAP 128* 176* 229* 252*   Lipid Profile: No results for input(s): CHOL, HDL, LDLCALC, TRIG, CHOLHDL, LDLDIRECT in the last 72 hours. Thyroid Function Tests: No results for input(s): TSH, T4TOTAL, FREET4, T3FREE, THYROIDAB in the last 72 hours. Anemia Panel: No results for input(s): VITAMINB12, FOLATE, FERRITIN, TIBC, IRON, RETICCTPCT in the last 72 hours. Urine analysis:    Component Value Date/Time   COLORURINE YELLOW 02/16/2017 0120   APPEARANCEUR HAZY (A) 02/16/2017 0120   LABSPEC 1.011 02/16/2017 0120   PHURINE 6.0 02/16/2017 0120   GLUCOSEU NEGATIVE 02/16/2017 0120   HGBUR MODERATE (A) 02/16/2017 0120   BILIRUBINUR NEGATIVE 02/16/2017 0120   KETONESUR NEGATIVE 02/16/2017 0120   PROTEINUR NEGATIVE 02/16/2017 0120   NITRITE NEGATIVE 02/16/2017 0120   LEUKOCYTESUR LARGE (A) 02/16/2017 0120   Sepsis Labs: @LABRCNTIP (procalcitonin:4,lacticidven:4)  ) Recent Results (from the past 240 hour(s))  TECHNOLOGIST REVIEW     Status: None   Collection Time: 02/12/17  9:49 AM  Result Value  Ref Range Status   Technologist Review   Final    Metas, Myelocytes, and Promyelocytes present, Sl poly, Few teardrop and ovalos, 3% Nrbcs         Radiology Studies: Dg Chest 2 View  Result Date: 02/15/2017 CLINICAL DATA:  76 year old female with weakness. EXAM: CHEST  2 VIEW COMPARISON:  Chest CT dated 01/03/2017 FINDINGS: Right pectoral Port-A-Cath with the tip over central SVC. The lungs are clear. There is no pleural effusion or pneumothorax. The cardiac silhouette is within normal limits. No acute osseous pathology. IMPRESSION: No active cardiopulmonary disease. Electronically Signed   By: Anner Crete M.D.   On: 02/15/2017 23:35   Ct Abdomen Pelvis W Contrast  Result Date: 02/16/2017 CLINICAL DATA:  76 year old female with abdominal distention and pain. History of breast cancer. EXAM: CT ABDOMEN AND PELVIS WITH CONTRAST TECHNIQUE: Multidetector CT imaging of the abdomen and pelvis was performed using the standard protocol following bolus administration of intravenous contrast. CONTRAST:  83mL ISOVUE-300 IOPAMIDOL (ISOVUE-300) INJECTION 61% COMPARISON:  Abdominal CT dated 09/20/2016 FINDINGS: Lower chest: A 3  mm calcified granuloma in the left posterior lung base. The visualized lung bases are otherwise clear. No intra-abdominal free air or free fluid. Hepatobiliary: No focal liver abnormality is seen. No gallstones, gallbladder wall thickening, or biliary dilatation. Pancreas: Unremarkable. No pancreatic ductal dilatation or surrounding inflammatory changes. Spleen: Normal in size without focal abnormality. Adrenals/Urinary Tract: Subcentimeter hypodense nodule in the left adrenal gland, indeterminate, likely an adenoma. The right adrenal gland is unremarkable. The visualized ureters and urinary bladder appear unremarkable. Stomach/Bowel: Minimal thickened appearance of the rectal wall, likely related to underdistention. Mild proctitis or the possibility of underlying mass is less likely  but not entirely excluded. Correlation with clinical exam recommended. There is a 1.5 cm duodenal diverticulum. There is no bowel obstruction or active inflammation. Normal appendix. Vascular/Lymphatic: There is moderate aortoiliac atherosclerotic disease. No aneurysmal dilatation or evidence of dissection. The IVC is grossly unremarkable. The SMV, splenic vein, and main portal vein are patent. No portal venous gas. There is no adenopathy. Reproductive: The uterus is anteverted. Small calcified posterior fundal fibroid noted. The ovaries are grossly unremarkable. Other: None Musculoskeletal: There is degenerative changes of the spine. No acute osseous pathology. No CT evidence of osseous metastatic disease. IMPRESSION: 1. Minimal apparent thickening of the rectal wall, likely related to underdistention. Mild inflammatory changes or underline lesion is not entirely excluded. Correlation with clinical exam recommended. No bowel obstruction. Normal appendix. 2. No intra-abdominal or pelvic adenopathy or evidence of metastatic disease within the abdomen or pelvis. 3. Subcentimeter left adrenal hypodense lesion, similar to prior CT, likely an adenoma. 4.  Aortic Atherosclerosis (ICD10-I70.0). Electronically Signed   By: Anner Crete M.D.   On: 02/16/2017 01:19   Ct L-spine No Charge  Result Date: 02/16/2017 CLINICAL DATA:  Initial evaluation for back pain. EXAM: CT LUMBAR SPINE WITHOUT CONTRAST TECHNIQUE: Multidetector CT imaging of the lumbar spine was performed without intravenous contrast administration. Multiplanar CT image reconstructions were also generated. COMPARISON:  None available. FINDINGS: Segmentation: Normal segmentation. Lowest well-formed disc labeled the L5-S1 level. Alignment: Trace anterolisthesis of L4 on L5 and L5 on S1. Vertebral bodies otherwise normally aligned with preservation of the normal lumbar lordosis. Vertebrae: Vertebral body heights maintained. No evidence for acute or chronic  fracture. No discrete lytic or blastic osseous lesions. Visualized sacrum and pelvis intact. SI joints fairly symmetric and within normal limits. Paraspinal and other soft tissues: Chronic atrophy noted within the lower paraspinous musculature. Paraspinous soft tissues demonstrate no acute abnormality. Calcified uterine fibroid noted. Aortic atherosclerosis. Visualized visceral structures otherwise unremarkable. Disc levels: L1-2: Minimal disc bulge. Mild bilateral facet hypertrophy. No stenosis. L2-3: Minimal annular disc bulge. Moderate facet hypertrophy, worse on the right. No significant canal stenosis. Mild bilateral foraminal narrowing, slightly greater on the right. L3-4: Mild diffuse disc bulge. Moderate facet and ligamentum flavum hypertrophy. No significant canal stenosis. Mild bilateral L3 foraminal narrowing. L4-5: Trace anterolisthesis. Broad posterior disc bulge. Advanced facet arthrosis. Resultant mild to moderate canal and subarticular stenosis. Mild to moderate bilateral foraminal narrowing. L5-S1: Trace anterolisthesis. No significant disc bulge. Advanced facet arthrosis. No significant stenosis. IMPRESSION: 1. No acute abnormality within the lumbar spine. 2. Trace anterolisthesis of L4 on L5 and L5 on S1 with associated advanced facet arthropathy. Finding could serve as a source for low back pain. 3. Mild to moderate canal and bilateral foraminal stenosis at L4-5 due to disc bulge and facet disease. 4. Additional more mild multilevel degenerative spondylolysis as above. Electronically Signed   By: Pincus Badder.D.  On: 02/16/2017 01:25        Scheduled Meds: . amLODipine  10 mg Oral Daily  . aspirin  81 mg Oral Daily  . dexamethasone  4 mg Oral Daily  . enoxaparin (LOVENOX) injection  40 mg Subcutaneous Q24H  . fluconazole  200 mg Oral Daily  . glimepiride  1 mg Oral Q breakfast  . insulin aspart  0-9 Units Subcutaneous TID WC  . iopamidol      . metoprolol succinate  100  mg Oral Daily  . simvastatin  10 mg Oral QHS   Continuous Infusions: . 0.9 % NaCl with KCl 20 mEq / L 50 mL/hr at 02/16/17 0748  . ciprofloxacin 400 mg (02/16/17 1153)  . metronidazole 500 mg (02/16/17 1032)     LOS: 0 days    Time spent: 80min    Domenic Polite, MD [facility] Page via [email or web address], password [email or web address] After 7PM please contact night-coverage  02/16/2017, 12:41 PM

## 2017-02-17 DIAGNOSIS — K7689 Other specified diseases of liver: Secondary | ICD-10-CM

## 2017-02-17 DIAGNOSIS — E876 Hypokalemia: Secondary | ICD-10-CM

## 2017-02-17 LAB — GASTROINTESTINAL PANEL BY PCR, STOOL (REPLACES STOOL CULTURE)
ASTROVIRUS: NOT DETECTED
Adenovirus F40/41: NOT DETECTED
Campylobacter species: NOT DETECTED
Cryptosporidium: NOT DETECTED
Cyclospora cayetanensis: NOT DETECTED
ENTAMOEBA HISTOLYTICA: NOT DETECTED
ENTEROAGGREGATIVE E COLI (EAEC): NOT DETECTED
Enteropathogenic E coli (EPEC): NOT DETECTED
Enterotoxigenic E coli (ETEC): NOT DETECTED
GIARDIA LAMBLIA: NOT DETECTED
Norovirus GI/GII: NOT DETECTED
Plesimonas shigelloides: NOT DETECTED
ROTAVIRUS A: NOT DETECTED
SALMONELLA SPECIES: NOT DETECTED
SHIGA LIKE TOXIN PRODUCING E COLI (STEC): NOT DETECTED
SHIGELLA/ENTEROINVASIVE E COLI (EIEC): NOT DETECTED
Sapovirus (I, II, IV, and V): NOT DETECTED
VIBRIO CHOLERAE: NOT DETECTED
VIBRIO SPECIES: NOT DETECTED
YERSINIA ENTEROCOLITICA: NOT DETECTED

## 2017-02-17 LAB — GLUCOSE, CAPILLARY
GLUCOSE-CAPILLARY: 133 mg/dL — AB (ref 65–99)
GLUCOSE-CAPILLARY: 154 mg/dL — AB (ref 65–99)
GLUCOSE-CAPILLARY: 163 mg/dL — AB (ref 65–99)
GLUCOSE-CAPILLARY: 164 mg/dL — AB (ref 65–99)
GLUCOSE-CAPILLARY: 167 mg/dL — AB (ref 65–99)
GLUCOSE-CAPILLARY: 173 mg/dL — AB (ref 65–99)
Glucose-Capillary: 148 mg/dL — ABNORMAL HIGH (ref 65–99)

## 2017-02-17 LAB — CBC
HEMATOCRIT: 30.7 % — AB (ref 36.0–46.0)
HEMOGLOBIN: 9.9 g/dL — AB (ref 12.0–15.0)
MCH: 26.8 pg (ref 26.0–34.0)
MCHC: 32.2 g/dL (ref 30.0–36.0)
MCV: 83 fL (ref 78.0–100.0)
PLATELETS: 193 10*3/uL (ref 150–400)
RBC: 3.7 MIL/uL — AB (ref 3.87–5.11)
RDW: 17.2 % — ABNORMAL HIGH (ref 11.5–15.5)
WBC: 21.7 10*3/uL — AB (ref 4.0–10.5)

## 2017-02-17 LAB — URINE CULTURE

## 2017-02-17 LAB — BASIC METABOLIC PANEL
ANION GAP: 9 (ref 5–15)
BUN: 32 mg/dL — ABNORMAL HIGH (ref 6–20)
CO2: 25 mmol/L (ref 22–32)
Calcium: 8.3 mg/dL — ABNORMAL LOW (ref 8.9–10.3)
Chloride: 105 mmol/L (ref 101–111)
Creatinine, Ser: 1.03 mg/dL — ABNORMAL HIGH (ref 0.44–1.00)
GFR calc Af Amer: 60 mL/min — ABNORMAL LOW (ref >60)
GFR, EST NON AFRICAN AMERICAN: 51 mL/min — AB (ref >60)
GLUCOSE: 174 mg/dL — AB (ref 65–99)
POTASSIUM: 3.3 mmol/L — AB (ref 3.5–5.1)
Sodium: 139 mmol/L (ref 135–145)

## 2017-02-17 LAB — C DIFFICILE QUICK SCREEN W PCR REFLEX
C DIFFICLE (CDIFF) ANTIGEN: POSITIVE — AB
C Diff toxin: NEGATIVE

## 2017-02-17 LAB — HEPATITIS PANEL, ACUTE
HCV Ab: <0.1 s/co ratio (ref 0.0–0.9)
Hep A IgM: NEGATIVE
Hep B C IgM: NEGATIVE
Hepatitis B Surface Ag: NEGATIVE

## 2017-02-17 MED ORDER — POTASSIUM CHLORIDE CRYS ER 20 MEQ PO TBCR
40.0000 meq | EXTENDED_RELEASE_TABLET | Freq: Once | ORAL | Status: AC
  Administered 2017-02-17: 40 meq via ORAL
  Filled 2017-02-17: qty 2

## 2017-02-17 NOTE — Care Management Note (Signed)
Case Management Note  Patient Details  Name: Laurie Collins MRN: [id number] Date of Birth: 1941/03/18  Subjective/Objective:                  sepsis  Action/Plan: Date:  February 17, 2017 Chart reviewed for concurrent status and case management needs.  Will continue to follow patient progress.  Discharge Planning: following for needs  Expected discharge date: February 20, 2017  Laurie Collins, BSN, [place], [place]   Expected Discharge Date:  02/18/17               Expected Discharge Plan:  Home/Self Care  In-House Referral:     Discharge planning Services  CM Consult  Post Acute Care Choice:    Choice offered to:     DME Arranged:    DME Agency:     HH Arranged:    HH Agency:     Status of Service:  In process, will continue to follow  If discussed at Long Length of Stay Meetings, dates discussed:    Additional Comments:  Laurie Cha, RN 02/17/2017, 10:38 AM

## 2017-02-17 NOTE — Evaluation (Signed)
Physical Therapy Evaluation Patient Details Name: Laurie Collins MRN: [id number] DOB: Sep 03, 1940 Today's Date: 02/17/2017   History of Present Illness  76 yo female admitted with collitis, hypokalemia. Hx of breast cancer, COPD, DM, chronic back pain.   Clinical Impression  On eval, pt required Min assist for mobility. She walked ~50 feet with a RW. Pt appears anxious and fearful at times. Pt presents with general weakness, decreased activity tolerance, and impaired gait and balance. Discussed d/c plan-pt would like to return home. She stated she lives with her son who works. Based on today's performance, do not feel pt is safe to mobilize at home alone. She is currently at risk for falls. Recommend ST rehab at SNF if pt will agree.     Follow Up Recommendations SNF (HHPT and 24 hour supervision/assist if pt declines placement)    Equipment Recommendations  None recommended by PT    Recommendations for Other Services       Precautions / Restrictions Precautions Precautions: Fall Precaution Comments: port-a-cath Restrictions Weight Bearing Restrictions: No      Mobility  Bed Mobility Overal bed mobility: Needs Assistance Bed Mobility: Supine to Sit     Supine to sit: Min guard;HOB elevated     General bed mobility comments: close guard for safety. Increased time. Pt relied on bedrail  Transfers Overall transfer level: Needs assistance Equipment used: Rolling walker (2 wheeled) Transfers: Sit to/from Stand Sit to Stand: Min assist         General transfer comment: Assist to rise, stabilize, control descent. VCS safety, technique, hand placmeent. Unsteady. Pt is also somewhat fearful  Ambulation/Gait Ambulation/Gait assistance: Min assist Ambulation Distance (Feet): 50 Feet Assistive device: Rolling walker (2 wheeled) Gait Pattern/deviations: Step-through pattern;Decreased stride length     General Gait Details: Assist to stabilize throughout ambulation  distance and to maneuver safely with RW intermittently. VCs safety, safe use of RW. Pt is unsteady. She fatigues easily.   Stairs            Wheelchair Mobility    Modified Rankin (Stroke Patients Only)       Balance Overall balance assessment: Needs assistance         Standing balance support: Bilateral upper extremity supported Standing balance-Leahy Scale: Poor Standing balance comment: requiring RW and external assist                             Pertinent Vitals/Pain Pain Assessment: Faces Faces Pain Scale: Hurts little more Pain Location: chronic back pain    Home Living Family/patient expects to be discharged to:: Private residence Living Arrangements: Children(son Tommy; works during day) Available Help at Discharge: Family Type of Home: House Home Access: Stairs to enter Entrance Stairs-Rails: None Entrance Stairs-Number of Steps: 2 Home Layout: One level Home Equipment: Environmental consultant - 2 wheels;Cane - single point;Shower seat      Prior Function Level of Independence: Needs assistance   Gait / Transfers Assistance Needed: using rW for ambulation  ADL's / Homemaking Assistance Needed: pt reports bathing/dressing is difficult but that she was able to perform tasks with time        Hand Dominance        Extremity/Trunk Assessment   Upper Extremity Assessment Upper Extremity Assessment: Generalized weakness    Lower Extremity Assessment Lower Extremity Assessment: Generalized weakness    Cervical / Trunk Assessment Cervical / Trunk Assessment: Normal  Communication   Communication: No difficulties  Cognition Arousal/Alertness: Awake/alert Behavior During Therapy: Anxious Overall Cognitive Status: Within Functional Limits for tasks assessed                                        General Comments      Exercises     Assessment/Plan    PT Assessment Patient needs continued PT services  PT Problem List  Decreased strength;Decreased balance;Decreased activity tolerance;Decreased mobility;Decreased safety awareness;Decreased knowledge of use of DME       PT Treatment Interventions DME instruction;Gait training;Functional mobility training;Therapeutic activities;Therapeutic exercise;Balance training;Patient/family education    PT Goals (Current goals can be found in the Care Plan section)  Acute Rehab PT Goals Patient Stated Goal: home. to get stronger PT Goal Formulation: With patient Time For Goal Achievement: 03/03/17 Potential to Achieve Goals: Good    Frequency Min 3X/week   Barriers to discharge        Co-evaluation               AM-PAC PT "6 Clicks" Daily Activity  Outcome Measure Difficulty turning over in bed (including adjusting bedclothes, sheets and blankets)?: A Lot Difficulty moving from lying on back to sitting on the side of the bed? : A Lot Difficulty sitting down on and standing up from a chair with arms (e.g., wheelchair, bedside commode, etc,.)?: Unable Help needed moving to and from a bed to chair (including a wheelchair)?: A Little Help needed walking in hospital room?: A Little Help needed climbing 3-5 steps with a railing? : A Lot 6 Click Score: 13    End of Session Equipment Utilized During Treatment: Gait belt Activity Tolerance: Patient limited by fatigue Patient left: in chair;with call bell/phone within reach   PT Visit Diagnosis: Muscle weakness (generalized) (M62.81);Difficulty in walking, not elsewhere classified (R26.2)    Time: 3818-2993 PT Time Calculation (min) (ACUTE ONLY): 17 min   Charges:   PT Evaluation $PT Eval Moderate Complexity: 1 Mod     PT G Codes:          Weston Anna, MPT Pager: [phone]

## 2017-02-17 NOTE — Progress Notes (Signed)
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 Presented to the emergency room due to diarrhea for few days along with bilateral leg weakness and trouble walking for about a week she was just seen in the cancer center 3 days prior to admission, started on fluconazole for possible thrush. n the emergency room she was noted to have a white count of 30,000, with elevated lactic acid, hypokalemia, unremarkable CT abdomen and pelvis   Assessment & Plan:   1.Diarrhea -I suspect this is primarily secondary to her chemotherapy, both Cytoxan and Adriamycin can cause diarrhea and mucositis, CT does not show evidence of colitis -WBC significantly elevated however she received Neulasta 2 weeks ago which could be contributing -Follow-up C. Difficile PCR and GI pathogen panel, if negative will stop antibiotics -Supportive care, cut down IVF -Advanced diet, abdominal exam is benign -ambulate, PT eval  2. Weakness -Also likely secondary to dehydration, she does describe some symptoms of peripheral neuropathy which are likely again related to  Chemotherapy -Neuro exam is nonfocal -CT L-spine notes some degenerative changes, no acute findings -hydrated, physical therapy consult requested  3. Bilateral breast cancer with stage IIIB on the left -recent chemotherapy -We will notify Dr. Sonny Dandy via Epic  4. Hypokalemia -Due to recent diarrhea, replaced  5. Mild transaminitis -could also be from chemotherapy, monitor -CT unremarkable for any hepatobiliary findings  6. DM -on Glimepiride nd metformin at home -Continue sliding-scale insulin -Hold oral hypoglycemics:  7. HTN -continue amlodipine and metoprolol  DVT  prophylaxis:Lovenox Code Status: full code Family Communication:no family at bedside Disposition Plan: home pending improvement     Procedures:   Antimicrobials:  Cipro and Flagyl 11/3->  Subjective: -diarrhea improved, soft stool this am   Objective: Vitals:   02/16/17 1343 02/16/17 2031 02/17/17 0406 02/17/17 1336  BP: (!) 92/53 114/61 130/69 (!) 116/55  Pulse: 76 70 75 82  Resp: 14 14 14 16   Temp: 98 F (36.7 C) 99.4 F (37.4 C) 97.7 F (36.5 C) 97.9 F (36.6 C)  TempSrc: Oral Axillary Oral Oral  SpO2: 100% 98% 100% 98%  Weight:      Height:        Intake/Output Summary (Last 24 hours) at 02/17/2017 1454 Last data filed at 02/17/2017 1315 Gross per 24 hour  Intake 1280 ml  Output 2 ml  Net 1278 ml   Filed Weights   02/15/17 2246 02/16/17 0422  Weight: 73.5 kg (162 lb) 78.3 kg (172 lb 9.9 oz)    Examination:  General exam: Appears calm and comfortable  Respiratory system: Clear to auscultation. Respiratory effort normal. Cardiovascular system: S1 & S2 heard, RRR. No JVD, murmurs, rubs, gallops Gastrointestinal system: Abdomen is nondistended, soft and nontender.Normal bowel sounds heard. Central nervous system: Alert and oriented. No focal neurological deficits. Extremities: Symmetric 5 x 5 power. Skin: No rashes, lesions or ulcers Psychiatry: Judgement and insight appear normal. Mood & affect appropriate.     Data Reviewed:   CBC: Recent Labs  Lab 02/12/17 0949 02/15/17 2340 02/16/17 0004 02/16/17 0657 02/17/17 0451  WBC 10.9* 30.3*  --  27.4* 21.7*  NEUTROABS 8.9* 28.5*  --   --   --   HGB 11.6 10.9* 10.9* 11.1* 9.9*  HCT  36.1 34.1* 32.0* 35.1* 30.7*  MCV 83.4 83.6  --  84.0 83.0  PLT 228 240  --  228 948   Basic Metabolic Panel: Recent Labs  Lab 02/12/17 0949 02/15/17 2340 02/16/17 0004 02/16/17 0657 02/16/17 2046 02/17/17 0451  NA 139 140 140 138 140 139  K 4.0 2.8* 3.4* 3.2* 3.3* 3.3*  CL  --  102 108 101 103 105  CO2 23 24   --  25 27 25   GLUCOSE 203* 115* 115* 232* 86 174*  BUN 60.3* 41* 48* 31* 33* 32*  CREATININE 1.6* 1.37* 1.40* 1.05* 1.34* 1.03*  CALCIUM 9.8 9.0  --  8.5* 8.3* 8.3*   GFR: Estimated Creatinine Clearance: 46.1 mL/min (A) (by C-G formula based on SCr of 1.03 mg/dL (H)). Liver Function Tests: Recent Labs  Lab 02/12/17 0949 02/15/17 2340 02/16/17 0657  AST 41* 52* 48*  ALT 70* 69* 71*  ALKPHOS 70 64 63  BILITOT 0.48 0.5 0.6  PROT 7.2 6.4* 6.5  ALBUMIN 3.1* 3.2* 3.3*   No results for input(s): LIPASE, AMYLASE in the last 168 hours. No results for input(s): AMMONIA in the last 168 hours. Coagulation Profile: No results for input(s): INR, PROTIME in the last 168 hours. Cardiac Enzymes: No results for input(s): CKTOTAL, CKMB, CKMBINDEX, TROPONINI in the last 168 hours. BNP (last 3 results) No results for input(s): PROBNP in the last 8760 hours. HbA1C: No results for input(s): HGBA1C in the last 72 hours. CBG: Recent Labs  Lab 02/16/17 2025 02/17/17 0102 02/17/17 0304 02/17/17 0736 02/17/17 1115  GLUCAP 89 133* 173* 154* 163*   Lipid Profile: No results for input(s): CHOL, HDL, LDLCALC, TRIG, CHOLHDL, LDLDIRECT in the last 72 hours. Thyroid Function Tests: No results for input(s): TSH, T4TOTAL, FREET4, T3FREE, THYROIDAB in the last 72 hours. Anemia Panel: No results for input(s): VITAMINB12, FOLATE, FERRITIN, TIBC, IRON, RETICCTPCT in the last 72 hours. Urine analysis:    Component Value Date/Time   COLORURINE YELLOW 02/16/2017 0120   APPEARANCEUR HAZY (A) 02/16/2017 0120   LABSPEC 1.011 02/16/2017 0120   PHURINE 6.0 02/16/2017 0120   GLUCOSEU NEGATIVE 02/16/2017 0120   HGBUR MODERATE (A) 02/16/2017 0120   BILIRUBINUR NEGATIVE 02/16/2017 0120   KETONESUR NEGATIVE 02/16/2017 0120   PROTEINUR NEGATIVE 02/16/2017 0120   NITRITE NEGATIVE 02/16/2017 0120   LEUKOCYTESUR LARGE (A) 02/16/2017 0120   Sepsis Labs: @LABRCNTIP (procalcitonin:4,lacticidven:4)  ) Recent  Results (from the past 240 hour(s))  TECHNOLOGIST REVIEW     Status: None   Collection Time: 02/12/17  9:49 AM  Result Value Ref Range Status   Technologist Review   Final    Metas, Myelocytes, and Promyelocytes present, Sl poly, Few teardrop and ovalos, 3% Nrbcs  Blood Culture (routine x 2)     Status: None (Preliminary result)   Collection Time: 02/15/17 11:41 PM  Result Value Ref Range Status   Specimen Description BLOOD RIGHT PORTA CATH  Final   Special Requests   Final    BOTTLES DRAWN AEROBIC AND ANAEROBIC Blood Culture adequate volume   Culture   Final    NO GROWTH 1 DAY Performed at [facility], [address], [place], [place] [address]    Report Status PENDING  Incomplete  Urine culture     Status: Abnormal   Collection Time: 02/16/17  1:20 AM  Result Value Ref Range Status   Specimen Description URINE, RANDOM  Final   Special Requests NONE  Final   Culture MULTIPLE SPECIES PRESENT,  SUGGEST RECOLLECTION (A)  Final   Report Status 02/17/2017 FINAL  Final  Blood Culture (routine x 2)     Status: None (Preliminary result)   Collection Time: 02/16/17  6:47 AM  Result Value Ref Range Status   Specimen Description BLOOD RIGHT ANTECUBITAL  Final   Special Requests IN PEDIATRIC BOTTLE Blood Culture adequate volume  Final   Culture   Final    NO GROWTH < 24 HOURS Performed at [facility], [place] [address] [place], [place] [address]    Report Status PENDING  Incomplete  C difficile quick scan w PCR reflex     Status: Abnormal   Collection Time: 02/17/17  8:45 AM  Result Value Ref Range Status   C Diff antigen POSITIVE (A) NEGATIVE Final   C Diff toxin NEGATIVE NEGATIVE Final   C Diff interpretation Results are indeterminate. See PCR results.  Final         Radiology Studies: Dg Chest 2 View  Result Date: 02/15/2017 CLINICAL DATA:  76 year old female with weakness. EXAM: CHEST  2 VIEW COMPARISON:  Chest CT dated 01/03/2017 FINDINGS: Right pectoral  Port-A-Cath with the tip over central SVC. The lungs are clear. There is no pleural effusion or pneumothorax. The cardiac silhouette is within normal limits. No acute osseous pathology. IMPRESSION: No active cardiopulmonary disease. Electronically Signed   By: Anner Crete M.D.   On: 02/15/2017 23:35   Ct Abdomen Pelvis W Contrast  Result Date: 02/16/2017 CLINICAL DATA:  76 year old female with abdominal distention and pain. History of breast cancer. EXAM: CT ABDOMEN AND PELVIS WITH CONTRAST TECHNIQUE: Multidetector CT imaging of the abdomen and pelvis was performed using the standard protocol following bolus administration of intravenous contrast. CONTRAST:  105mL ISOVUE-300 IOPAMIDOL (ISOVUE-300) INJECTION 61% COMPARISON:  Abdominal CT dated 09/20/2016 FINDINGS: Lower chest: A 3 mm calcified granuloma in the left posterior lung base. The visualized lung bases are otherwise clear. No intra-abdominal free air or free fluid. Hepatobiliary: No focal liver abnormality is seen. No gallstones, gallbladder wall thickening, or biliary dilatation. Pancreas: Unremarkable. No pancreatic ductal dilatation or surrounding inflammatory changes. Spleen: Normal in size without focal abnormality. Adrenals/Urinary Tract: Subcentimeter hypodense nodule in the left adrenal gland, indeterminate, likely an adenoma. The right adrenal gland is unremarkable. The visualized ureters and urinary bladder appear unremarkable. Stomach/Bowel: Minimal thickened appearance of the rectal wall, likely related to underdistention. Mild proctitis or the possibility of underlying mass is less likely but not entirely excluded. Correlation with clinical exam recommended. There is a 1.5 cm duodenal diverticulum. There is no bowel obstruction or active inflammation. Normal appendix. Vascular/Lymphatic: There is moderate aortoiliac atherosclerotic disease. No aneurysmal dilatation or evidence of dissection. The IVC is grossly unremarkable. The SMV,  splenic vein, and main portal vein are patent. No portal venous gas. There is no adenopathy. Reproductive: The uterus is anteverted. Small calcified posterior fundal fibroid noted. The ovaries are grossly unremarkable. Other: None Musculoskeletal: There is degenerative changes of the spine. No acute osseous pathology. No CT evidence of osseous metastatic disease. IMPRESSION: 1. Minimal apparent thickening of the rectal wall, likely related to underdistention. Mild inflammatory changes or underline lesion is not entirely excluded. Correlation with clinical exam recommended. No bowel obstruction. Normal appendix. 2. No intra-abdominal or pelvic adenopathy or evidence of metastatic disease within the abdomen or pelvis. 3. Subcentimeter left adrenal hypodense lesion, similar to prior CT, likely an adenoma. 4.  Aortic Atherosclerosis (ICD10-I70.0). Electronically Signed   By: Laren Everts.D.  On: 02/16/2017 01:19   Ct L-spine No Charge  Result Date: 02/16/2017 CLINICAL DATA:  Initial evaluation for back pain. EXAM: CT LUMBAR SPINE WITHOUT CONTRAST TECHNIQUE: Multidetector CT imaging of the lumbar spine was performed without intravenous contrast administration. Multiplanar CT image reconstructions were also generated. COMPARISON:  None available. FINDINGS: Segmentation: Normal segmentation. Lowest well-formed disc labeled the L5-S1 level. Alignment: Trace anterolisthesis of L4 on L5 and L5 on S1. Vertebral bodies otherwise normally aligned with preservation of the normal lumbar lordosis. Vertebrae: Vertebral body heights maintained. No evidence for acute or chronic fracture. No discrete lytic or blastic osseous lesions. Visualized sacrum and pelvis intact. SI joints fairly symmetric and within normal limits. Paraspinal and other soft tissues: Chronic atrophy noted within the lower paraspinous musculature. Paraspinous soft tissues demonstrate no acute abnormality. Calcified uterine fibroid noted. Aortic  atherosclerosis. Visualized visceral structures otherwise unremarkable. Disc levels: L1-2: Minimal disc bulge. Mild bilateral facet hypertrophy. No stenosis. L2-3: Minimal annular disc bulge. Moderate facet hypertrophy, worse on the right. No significant canal stenosis. Mild bilateral foraminal narrowing, slightly greater on the right. L3-4: Mild diffuse disc bulge. Moderate facet and ligamentum flavum hypertrophy. No significant canal stenosis. Mild bilateral L3 foraminal narrowing. L4-5: Trace anterolisthesis. Broad posterior disc bulge. Advanced facet arthrosis. Resultant mild to moderate canal and subarticular stenosis. Mild to moderate bilateral foraminal narrowing. L5-S1: Trace anterolisthesis. No significant disc bulge. Advanced facet arthrosis. No significant stenosis. IMPRESSION: 1. No acute abnormality within the lumbar spine. 2. Trace anterolisthesis of L4 on L5 and L5 on S1 with associated advanced facet arthropathy. Finding could serve as a source for low back pain. 3. Mild to moderate canal and bilateral foraminal stenosis at L4-5 due to disc bulge and facet disease. 4. Additional more mild multilevel degenerative spondylolysis as above. Electronically Signed   By: Jeannine Boga M.D.   On: 02/16/2017 01:25        Scheduled Meds: . amLODipine  10 mg Oral Daily  . aspirin  81 mg Oral Daily  . dexamethasone  4 mg Oral Daily  . enoxaparin (LOVENOX) injection  40 mg Subcutaneous Q24H  . fluconazole  200 mg Oral Daily  . insulin aspart  0-9 Units Subcutaneous TID WC  . metoprolol succinate  100 mg Oral Daily  . simvastatin  10 mg Oral QHS   Continuous Infusions: . sodium chloride    . ciprofloxacin Stopped (02/17/17 1214)  . metronidazole Stopped (02/17/17 1314)     LOS: 1 day    Time spent: 93min    Domenic Polite, MD [facility] Page via [email or web address], password [email or web address] After 7PM please contact night-coverage  02/17/2017, 2:54 PM

## 2017-02-18 DIAGNOSIS — R197 Diarrhea, unspecified: Secondary | ICD-10-CM

## 2017-02-18 DIAGNOSIS — R634 Abnormal weight loss: Secondary | ICD-10-CM

## 2017-02-18 DIAGNOSIS — B370 Candidal stomatitis: Secondary | ICD-10-CM

## 2017-02-18 DIAGNOSIS — C50211 Malignant neoplasm of upper-inner quadrant of right female breast: Secondary | ICD-10-CM

## 2017-02-18 DIAGNOSIS — R531 Weakness: Secondary | ICD-10-CM

## 2017-02-18 DIAGNOSIS — E860 Dehydration: Secondary | ICD-10-CM

## 2017-02-18 DIAGNOSIS — C50212 Malignant neoplasm of upper-inner quadrant of left female breast: Secondary | ICD-10-CM

## 2017-02-18 LAB — CBC
HEMATOCRIT: 34.5 % — AB (ref 36.0–46.0)
Hemoglobin: 11.2 g/dL — ABNORMAL LOW (ref 12.0–15.0)
MCH: 26.9 pg (ref 26.0–34.0)
MCHC: 32.5 g/dL (ref 30.0–36.0)
MCV: 82.7 fL (ref 78.0–100.0)
Platelets: 266 10*3/uL (ref 150–400)
RBC: 4.17 MIL/uL (ref 3.87–5.11)
RDW: 17 % — AB (ref 11.5–15.5)
WBC: 24.7 10*3/uL — AB (ref 4.0–10.5)

## 2017-02-18 LAB — GLUCOSE, CAPILLARY
GLUCOSE-CAPILLARY: 176 mg/dL — AB (ref 65–99)
GLUCOSE-CAPILLARY: 285 mg/dL — AB (ref 65–99)
GLUCOSE-CAPILLARY: 334 mg/dL — AB (ref 65–99)
Glucose-Capillary: 110 mg/dL — ABNORMAL HIGH (ref 65–99)

## 2017-02-18 LAB — BASIC METABOLIC PANEL
ANION GAP: 12 (ref 5–15)
BUN: 28 mg/dL — AB (ref 6–20)
CALCIUM: 8.5 mg/dL — AB (ref 8.9–10.3)
CO2: 26 mmol/L (ref 22–32)
Chloride: 102 mmol/L (ref 101–111)
Creatinine, Ser: 1.03 mg/dL — ABNORMAL HIGH (ref 0.44–1.00)
GFR calc Af Amer: 60 mL/min — ABNORMAL LOW (ref >60)
GFR calc non Af Amer: 51 mL/min — ABNORMAL LOW (ref >60)
GLUCOSE: 112 mg/dL — AB (ref 65–99)
POTASSIUM: 3.4 mmol/L — AB (ref 3.5–5.1)
Sodium: 140 mmol/L (ref 135–145)

## 2017-02-18 MED ORDER — FLUCONAZOLE 200 MG PO TABS: 200.0000 mg | ORAL_TABLET | Freq: Every day | ORAL | 0 refills | Status: DC

## 2017-02-18 NOTE — Progress Notes (Signed)
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 Presented to the emergency room due to diarrhea for few days along with bilateral leg weakness and trouble walking for about a week she was just seen in the cancer center 3 days prior to admission, started on fluconazole for possible thrush. n the emergency room she was noted to have a white count of 30,000, with elevated lactic acid, hypokalemia, unremarkable CT abdomen and pelvis   Assessment & Plan:   1.Diarrhea -resolved -I suspect this is primarily secondary to her chemotherapy, both Cytoxan and Adriamycin can cause diarrhea and mucositis, CT does not show evidence of colitis -WBC significantly elevated however she received Neulasta 2 weeks ago which could be contributing -Follow-up C. Difficile PCR positive but toxin negative and hence indeterminate and GI pathogen panel negative -stable off ABx, diarrhea improved -Advanced diet, abdominal exam is benign -ambulate, PT eval completed SNF recommended  2. Weakness -likely secondary to dehydration, cancer, she does describe some symptoms of peripheral neuropathy which are likely again related to  Chemotherapy -Neuro exam is nonfocal -CT L-spine notes some degenerative changes, no acute findings -hydrated, physical therapy consult requested, SNF recommended  3. Bilateral breast cancer with stage IIIB on the left -recent chemotherapy -notify [facility]  4. Hypokalemia -Due to recent diarrhea, replaced  5. Mild transaminitis -could also be from chemotherapy, monitor -CT unremarkable for any hepatobiliary findings  6. DM -on Glimepiride and metformin at home -Continue  sliding-scale insulin, metformin can also cause diarrhea, hold for 1 week post discharge -Hold oral hypoglycemics:  7. HTN -continue amlodipine and metoprolol  8. 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 -needs surveillance   DVT prophylaxis:Lovenox Code Status: full code Family Communication:no family at bedside Disposition Plan: SNF soon     Procedures:   Antimicrobials:  Cipro and Flagyl 11/3->  Subjective: -no diarrhea, wants to go home   Objective: Vitals:   02/17/17 0406 02/17/17 1336 02/17/17 2054 02/18/17 0541  BP: 130/69 (!) 116/55 117/62 (!) 166/83  Pulse: 75 82 79   Resp: 14 16 18 18   Temp: 97.7 F (36.5 C) 97.9 F (36.6 C) 98.2 F (36.8 C) 98.5 F (36.9 C)  TempSrc: Oral Oral Oral Oral  SpO2: 100% 98% 97% 100%  Weight:      Height:       No intake or output data in the 24 hours ending 02/18/17 1414 Filed Weights   02/15/17 2246 02/16/17 0422  Weight: 73.5 kg (162 lb) 78.3 kg (172 lb 9.9 oz)    Examination:  Gen: Awake, Alert, Oriented X 2, no distress  HEENT: PERRLA, Neck supple, no JVD Lungs: Good air movement bilaterally, CTAB CVS: RRR,No Gallops,Rubs or new Murmurs Abd: soft, Non tender, non distended, BS present Extremities: No Cyanosis, Clubbing or edema Skin: no new rashes    Data Reviewed:   CBC: Recent Labs  Lab 02/12/17 0949 02/15/17 2340 02/16/17 0004 02/16/17 0657 02/17/17 0451 02/18/17 0955  WBC 10.9* 30.3*  --  27.4* 21.7* 24.7*  NEUTROABS 8.9* 28.5*  --   --   --   --   HGB 11.6 10.9* 10.9* 11.1* 9.9* 11.2*  HCT 36.1 34.1* 32.0* 35.1* 30.7* 34.5*  MCV 83.4 83.6  --  84.0 83.0 82.7  PLT 228 240  --  228 193 962   Basic Metabolic Panel: Recent Labs  Lab 02/15/17 2340 02/16/17 0004 02/16/17 0657 02/16/17 2046 02/17/17 0451 02/18/17 0955  NA 140 140 138 140 139 140  K 2.8* 3.4* 3.2* 3.3* 3.3* 3.4*  CL 102 108 101 103 105 102  CO2 24  --  25 27 25 26   GLUCOSE 115* 115* 232* 86 174* 112*  BUN 41* 48* 31* 33* 32* 28*    CREATININE 1.37* 1.40* 1.05* 1.34* 1.03* 1.03*  CALCIUM 9.0  --  8.5* 8.3* 8.3* 8.5*   GFR: Estimated Creatinine Clearance: 46.1 mL/min (A) (by C-G formula based on SCr of 1.03 mg/dL (H)). Liver Function Tests: Recent Labs  Lab 02/12/17 0949 02/15/17 2340 02/16/17 0657  AST 41* 52* 48*  ALT 70* 69* 71*  ALKPHOS 70 64 63  BILITOT 0.48 0.5 0.6  PROT 7.2 6.4* 6.5  ALBUMIN 3.1* 3.2* 3.3*   No results for input(s): LIPASE, AMYLASE in the last 168 hours. No results for input(s): AMMONIA in the last 168 hours. Coagulation Profile: No results for input(s): INR, PROTIME in the last 168 hours. Cardiac Enzymes: No results for input(s): CKTOTAL, CKMB, CKMBINDEX, TROPONINI in the last 168 hours. BNP (last 3 results) No results for input(s): PROBNP in the last 8760 hours. HbA1C: No results for input(s): HGBA1C in the last 72 hours. CBG: Recent Labs  Lab 02/17/17 1548 02/17/17 2052 02/17/17 2351 02/18/17 0744 02/18/17 1149  GLUCAP 148* 167* 164* 110* 176*   Lipid Profile: No results for input(s): CHOL, HDL, LDLCALC, TRIG, CHOLHDL, LDLDIRECT in the last 72 hours. Thyroid Function Tests: No results for input(s): TSH, T4TOTAL, FREET4, T3FREE, THYROIDAB in the last 72 hours. Anemia Panel: No results for input(s): VITAMINB12, FOLATE, FERRITIN, TIBC, IRON, RETICCTPCT in the last 72 hours. Urine analysis:    Component Value Date/Time   COLORURINE YELLOW 02/16/2017 0120   APPEARANCEUR HAZY (A) 02/16/2017 0120   LABSPEC 1.011 02/16/2017 0120   PHURINE 6.0 02/16/2017 0120   GLUCOSEU NEGATIVE 02/16/2017 0120   HGBUR MODERATE (A) 02/16/2017 0120   BILIRUBINUR NEGATIVE 02/16/2017 0120   KETONESUR NEGATIVE 02/16/2017 0120   PROTEINUR NEGATIVE 02/16/2017 0120   NITRITE NEGATIVE 02/16/2017 0120   LEUKOCYTESUR LARGE (A) 02/16/2017 0120   Sepsis Labs: @LABRCNTIP (procalcitonin:4,lacticidven:4)  ) Recent Results (from the past 240 hour(s))  TECHNOLOGIST REVIEW     Status: None    Collection Time: 02/12/17  9:49 AM  Result Value Ref Range Status   Technologist Review   Final    Metas, Myelocytes, and Promyelocytes present, Sl poly, Few teardrop and ovalos, 3% Nrbcs  Blood Culture (routine x 2)     Status: None (Preliminary result)   Collection Time: 02/15/17 11:41 PM  Result Value Ref Range Status   Specimen Description BLOOD RIGHT PORTA CATH  Final   Special Requests   Final    BOTTLES DRAWN AEROBIC AND ANAEROBIC Blood Culture adequate volume   Culture   Final    NO GROWTH 2 DAYS Performed at [facility], [address] [place], [place] [address]    Report Status PENDING  Incomplete  Urine culture     Status: Abnormal   Collection Time: 02/16/17  1:20 AM  Result Value Ref Range Status   Specimen Description URINE, RANDOM  Final   Special Requests NONE  Final   Culture MULTIPLE SPECIES PRESENT, SUGGEST RECOLLECTION (A)  Final   Report Status 02/17/2017 FINAL  Final  Blood Culture (routine x 2)     Status: None (Preliminary result)   Collection Time: 02/16/17  6:47 AM  Result Value Ref Range Status   Specimen Description BLOOD RIGHT ANTECUBITAL  Final   Special Requests IN PEDIATRIC BOTTLE Blood Culture adequate volume  Final   Culture   Final    NO GROWTH 2 DAYS Performed at [facility], [place] [address], [place], [place] [address]    Report Status PENDING  Incomplete  C difficile quick scan w PCR reflex     Status: Abnormal   Collection Time: 02/17/17  8:45 AM  Result Value Ref Range Status   C Diff antigen POSITIVE (A) NEGATIVE Final   C Diff toxin NEGATIVE NEGATIVE Final   C Diff interpretation Results are indeterminate. See PCR results.  Final  Gastrointestinal Panel by PCR , Stool     Status: None   Collection Time: 02/17/17  8:45 AM  Result Value Ref Range Status   Campylobacter species NOT DETECTED NOT DETECTED Final   Plesimonas shigelloides NOT DETECTED NOT DETECTED Final   Salmonella species NOT DETECTED NOT DETECTED Final    Yersinia enterocolitica NOT DETECTED NOT DETECTED Final   Vibrio species NOT DETECTED NOT DETECTED Final   Vibrio cholerae NOT DETECTED NOT DETECTED Final   Enteroaggregative E coli (EAEC) NOT DETECTED NOT DETECTED Final   Enteropathogenic E coli (EPEC) NOT DETECTED NOT DETECTED Final   Enterotoxigenic E coli (ETEC) NOT DETECTED NOT DETECTED Final   Shiga like toxin producing E coli (STEC) NOT DETECTED NOT DETECTED Final   Shigella/Enteroinvasive E coli (EIEC) NOT DETECTED NOT DETECTED Final   Cryptosporidium NOT DETECTED NOT DETECTED Final   Cyclospora cayetanensis NOT DETECTED NOT DETECTED Final   Entamoeba histolytica NOT DETECTED NOT DETECTED Final   Giardia lamblia NOT DETECTED NOT DETECTED Final   Adenovirus F40/41 NOT DETECTED NOT DETECTED Final   Astrovirus NOT DETECTED NOT DETECTED Final   Norovirus GI/GII NOT DETECTED NOT DETECTED Final   Rotavirus A NOT DETECTED NOT DETECTED Final   Sapovirus (I, II, IV, and V) NOT DETECTED NOT DETECTED Final         Radiology Studies: No results found.      Scheduled Meds: . amLODipine  10 mg Oral Daily  . aspirin  81 mg Oral Daily  . dexamethasone  4 mg Oral Daily  . enoxaparin (LOVENOX) injection  40 mg Subcutaneous Q24H  . fluconazole  200 mg Oral Daily  . insulin aspart  0-9 Units Subcutaneous TID WC  . metoprolol succinate  100 mg Oral Daily  . simvastatin  10 mg Oral QHS   Continuous Infusions: . sodium chloride 50 mL/hr at 02/18/17 1326     LOS: 2 days    Time spent: 26min    Domenic Polite, MD [facility] Page via [email or web address], password [email or web address] After 7PM please contact night-coverage  02/18/2017, 2:14 PM

## 2017-02-18 NOTE — NC FL2 (Signed)
[place] LEVEL OF CARE SCREENING TOOL     IDENTIFICATION  Patient Name: Laurie Collins Birthdate: 03-Mar-1941 Sex: female Admission Date (Current Location): 02/15/2017  [facility] and [place] Number:  Herbalist and Address:  [facility],  [address], [place]      Provider Number: [id number]  Attending Physician Name and Address:  Domenic Polite, MD  Relative Name and Phone Number:       Current Level of Care: Hospital Recommended Level of Care: [place] Prior Approval Number:    Date Approved/Denied: 02/18/17 PASRR Number: [id number]  Discharge Plan: SNF    Current Diagnoses: Patient Active Problem List   Diagnosis Date Noted  . Colitis 02/16/2017  . Hypokalemia 02/16/2017  . Abnormal liver function 02/16/2017  . Port-A-Cath in place 02/12/2017  . COPD with acute exacerbation ([place]) 01/03/2017  . COPD exacerbation ([place]) 01/03/2017  . Genetic testing 10/04/2016  . Malignant neoplasm of upper-inner quadrant of left breast in female, estrogen receptor negative ([place]) 09/05/2016  . Malignant neoplasm of upper-inner quadrant of right breast in female, estrogen receptor positive ([place]) 09/05/2016    Orientation RESPIRATION BLADDER Height & Weight     Self, Time, Situation, Place  Normal Incontinent Weight: 172 lb 9.9 oz (78.3 kg) Height:  5\' 3"  (160 cm)  BEHAVIORAL SYMPTOMS/MOOD NEUROLOGICAL BOWEL NUTRITION STATUS      Continent Diet(See DC Summary)  AMBULATORY STATUS COMMUNICATION OF NEEDS Skin   Extensive Assist Verbally Normal                       Personal Care Assistance Level of Assistance  Bathing, Feeding, Dressing Bathing Assistance: Limited assistance Feeding assistance: Independent Dressing Assistance: Limited assistance     Functional Limitations Info  Sight, Hearing, Speech Sight Info: Adequate Hearing Info: Adequate Speech Info: Adequate    SPECIAL CARE FACTORS FREQUENCY   PT (By licensed PT), OT (By licensed OT)     PT Frequency: 5x/week OT Frequency: 5x/week            Contractures Contractures Info: Not present    Additional Factors Info  Code Status, Allergies, Isolation Precautions Code Status Info: Full Allergies Info: Doxycycline, Penicillins     Isolation Precautions Info: Enteric precautions (UV disinfection)     Current Medications (02/18/2017):  This is the current hospital active medication list Current Facility-Administered Medications  Medication Dose Route Frequency Provider Last Rate Last Dose  . 0.9 %  sodium chloride infusion   Intravenous Continuous Rise Patience, MD      . acetaminophen (TYLENOL) tablet 650 mg  650 mg Oral Q6H PRN Jani Gravel, MD   650 mg at 02/16/17 2305   Or  . acetaminophen (TYLENOL) suppository 650 mg  650 mg Rectal Q6H PRN Jani Gravel, MD      . albuterol (PROVENTIL) (2.5 MG/3ML) 0.083% nebulizer solution 3 mL  3 mL Inhalation Q6H PRN Jani Gravel, MD      . amLODipine (NORVASC) tablet 10 mg  10 mg Oral Daily Jani Gravel, MD   10 mg at 02/18/17 0856  . aspirin chewable tablet 81 mg  81 mg Oral Daily Jani Gravel, MD   81 mg at 02/18/17 0854  . dexamethasone (DECADRON) tablet 4 mg  4 mg Oral Daily Domenic Polite, MD   4 mg at 02/18/17 0856  . enoxaparin (LOVENOX) injection 40 mg  40 mg Subcutaneous Q24H Jani Gravel, MD   40 mg  at 02/18/17 0857  . fluconazole (DIFLUCAN) tablet 200 mg  200 mg Oral Daily Jani Gravel, MD   200 mg at 02/18/17 0856  . insulin aspart (novoLOG) injection 0-9 Units  0-9 Units Subcutaneous TID WC Jani Gravel, MD   1 Units at 02/17/17 1636  . magic mouthwash  5 mL Oral TID PRN Jani Gravel, MD      . metoprolol succinate (TOPROL-XL) 24 hr tablet 100 mg  100 mg Oral Daily Jani Gravel, MD   100 mg at 02/18/17 0854  . simvastatin (ZOCOR) tablet 10 mg  10 mg Oral Loma Sousa, MD   10 mg at 02/17/17 2219  . sodium chloride flush (NS) 0.9 % injection 10-40 mL  10-40 mL Intracatheter PRN  Domenic Polite, MD         Discharge Medications: Please see discharge summary for a list of discharge medications.  Relevant Imaging Results:  Relevant Lab Results:   Additional Information ssn [id number]  Servando Snare, LCSW

## 2017-02-18 NOTE — Clinical Social Work Note (Signed)
Clinical Social Work Assessment  Patient Details  Name: Laurie Collins MRN: [id number] Date of Birth: 1940-07-12  Date of referral:  02/18/17               Reason for consult:  Facility Placement, Discharge Planning                Permission sought to share information with:  Case Manager, Facility Sport and exercise psychologist, Family Supports Permission granted to share information::  Yes, Verbal Permission Granted  Name::     Laurie Collins  Agency::     Relationship::  Son  Contact Information:  [phone]  Housing/Transportation Living arrangements for the past 2 months:  [place] of Information:  Patient, Adult Children Patient Interpreter Needed:  None Criminal Activity/Legal Involvement Pertinent to Current Situation/Hospitalization:  No - Comment as needed Significant Relationships:  Adult Children Lives with:  Adult Children Do you feel safe going back to the place where you live?  Yes Need for family participation in patient care:  Yes (Comment)  Care giving concerns:  No care giving concerns at the time of assessment.    Social Worker assessment / plan:  LCSW following for SNF placement.  Patient is a 76 year old female admitted to the hospital for Colitis.    Patient currently lives with her adult son who works during the day. Due to his work schedule both patient and son are agreeable to SNF at discharge per PT recommendations. Patient and son would like a facility close to their home.   PLAN: Patient will go to SNF at DC.   Employment status:  Retired Nurse, adult PT Recommendations:  [place] / Referral to community resources:  [place]  Patient/Family's Response to care: Patient and son are responsive to patients current level of care and agree on [facility].  Patient/Family's Understanding of and Emotional Response to Diagnosis, Current Treatment, and Prognosis:    Patient  and family are aware and understanding of current diagnosis and agreeable to treatment plan.   Emotional Assessment Appearance:  Appears stated age Attitude/Demeanor/Rapport:    Affect (typically observed):  Accepting, Calm, Pleasant Orientation:  Oriented to Self, Oriented to Situation, Oriented to Place, Oriented to  Time Alcohol / Substance use:  Not Applicable Psych involvement (Current and /or in the community):  No (Comment)  Discharge Needs  Concerns to be addressed:  No discharge needs identified Readmission within the last 30 days:  No Current discharge risk:  None Barriers to Discharge:  Continued Medical Work up   [employer], LCSW 02/18/2017, 11:56 AM

## 2017-02-18 NOTE — Progress Notes (Signed)
HEMATOLOGY-ONCOLOGY PROGRESS NOTE  SUBJECTIVE: Hospitalized for severe weakness diarrhea dehydration Diarrhea subsided Generalized weakness and weight loss from prior chemotherapy   Malignant neoplasm of upper-inner quadrant of left breast in female, estrogen receptor negative ([place])   09/02/2016 Initial Diagnosis    Bilateral breast cancers: Left breast 11:30 position 6.3 cm with 3 abnormal lymph nodes biopsy of both IDC grade 2 LVI, triple negative, Ki-67 90% T3 N1 stage IIIB; right breast multiple masses 1.4 cm and 1:00, 5 mm 12:30 axilla negative biopsy ILC grade 2, ER 90%, PR 30%, Ki-67 10%, HER-2 negative ratio 1.74, T1 cN0 stage IA      09/25/2016 -  Neo-Adjuvant Chemotherapy    Taxol weekly x 12, followed by Doxorubicin and Cyclophosphamide x 4      10/03/2016 Genetic Testing    Underwent genetic counseling 10/03/2016. Declined genetic testing.      01/03/2017 - 01/05/2017 Hospital Admission    Shortness of breath and wheezing from COPD exacerbation       Malignant neoplasm of upper-inner quadrant of right breast in female, estrogen receptor positive ([place])   09/05/2016 Initial Diagnosis    Malignant neoplasm of upper-inner quadrant of right breast in female, estrogen receptor positive ([place])     10/03/2016 Genetic Testing    Underwent genetic counseling 10/03/2016. Declined genetic testing.       OBJECTIVE: REVIEW OF SYSTEMS:   Constitutional: Denies fevers, chills; 18 pound weight loss Eyes: Denies blurriness of vision Ears, nose, mouth, throat, and face: Denies mucositis or sore throat Respiratory: Denies cough, dyspnea or wheezes Cardiovascular: Denies palpitation, chest discomfort Gastrointestinal: Diarrhea has subsided Skin: Denies abnormal skin rashes Lymphatics: Denies new lymphadenopathy or easy bruising Neurological: Generalized weaknesses Behavioral/Psych: Mood is stable, no new changes  Extremities: No lower extremity edema All other systems were reviewed with  the patient and are negative.  I have reviewed the past medical history, past surgical history, social history and family history with the patient and they are unchanged from previous note.   PHYSICAL EXAMINATION: ECOG PERFORMANCE STATUS: 2 - Symptomatic, <50% confined to bed  Vitals:   02/18/17 0541 02/18/17 1434  BP: (!) 166/83 116/67  Pulse:  85  Resp: 18 18  Temp: 98.5 F (36.9 C) 97.9 F (36.6 C)  SpO2: 100% 97%   Filed Weights   02/15/17 2246 02/16/17 0422  Weight: 162 lb (73.5 kg) 172 lb 9.9 oz (78.3 kg)    GENERAL:alert, no distress and comfortable SKIN: skin color, texture, turgor are normal, no rashes or significant lesions EYES: normal, Conjunctiva are pink and non-injected, sclera clear OROPHARYNX:no exudate, no erythema and lips, buccal mucosa, and tongue normal  NECK: supple, thyroid normal size, non-tender, without nodularity LYMPH:  no palpable lymphadenopathy in the cervical, axillary or inguinal LUNGS: clear to auscultation and percussion with normal breathing effort HEART: regular rate & rhythm and no murmurs and no lower extremity edema ABDOMEN:abdomen soft, non-tender and normal bowel sounds Musculoskeletal:no cyanosis of digits and no clubbing  NEURO: alert & oriented x 3 with fluent speech, no focal motor/sensory deficits  LABORATORY DATA:  I have reviewed the data as listed CMP Latest Ref Rng & Units 02/18/2017 02/17/2017 02/16/2017  Glucose 65 - 99 mg/dL 112(H) 174(H) 86  BUN 6 - 20 mg/dL 28(H) 32(H) 33(H)  Creatinine 0.44 - 1.00 mg/dL 1.03(H) 1.03(H) 1.34(H)  Sodium 135 - 145 mmol/L 140 139 140  Potassium 3.5 - 5.1 mmol/L 3.4(L) 3.3(L) 3.3(L)  Chloride 101 - 111 mmol/L 102 105 103  CO2 22 - 32 mmol/L 26 25 27   Calcium 8.9 - 10.3 mg/dL 8.5(L) 8.3(L) 8.3(L)  Total Protein 6.5 - 8.1 g/dL - - -  Total Bilirubin 0.3 - 1.2 mg/dL - - -  Alkaline Phos 38 - 126 U/L - - -  AST 15 - 41 U/L - - -  ALT 14 - 54 U/L - - -    Lab Results  Component Value  Date   WBC 24.7 (H) 02/18/2017   HGB 11.2 (L) 02/18/2017   HCT 34.5 (L) 02/18/2017   MCV 82.7 02/18/2017   PLT 266 02/18/2017   NEUTROABS 28.5 (H) 02/15/2017    ASSESSMENT AND PLAN: 1. Bilateral breast cancers on neoadjuvant chemotherapy: Patient completed 12 weeks of Taxol and 3 cycles of dose dense Adriamycin and Cytoxan. I discussed with her that we will not give her the last cycle of chemotherapy because of weakness and decline in performance status. We will arrange an outpatient breast MRI and a surgery consultation for with bilateral breast surgery. I discussed with her that in the meantime patient needs to get stronger both nutritionally and physically so that she can tolerate bilateral breast surgery

## 2017-02-19 ENCOUNTER — Ambulatory Visit: Payer: Medicare HMO

## 2017-02-19 ENCOUNTER — Ambulatory Visit: Payer: Medicare HMO | Admitting: Hematology and Oncology

## 2017-02-19 ENCOUNTER — Ambulatory Visit (HOSPITAL_COMMUNITY): Admission: RE | Admit: 2017-02-19 | Payer: Medicare HMO | Source: Ambulatory Visit

## 2017-02-19 ENCOUNTER — Other Ambulatory Visit: Payer: Medicare HMO

## 2017-02-19 LAB — GLUCOSE, CAPILLARY
GLUCOSE-CAPILLARY: 157 mg/dL — AB (ref 65–99)
GLUCOSE-CAPILLARY: 158 mg/dL — AB (ref 65–99)
GLUCOSE-CAPILLARY: 220 mg/dL — AB (ref 65–99)
GLUCOSE-CAPILLARY: 280 mg/dL — AB (ref 65–99)
GLUCOSE-CAPILLARY: 281 mg/dL — AB (ref 65–99)
Glucose-Capillary: 242 mg/dL — ABNORMAL HIGH (ref 65–99)

## 2017-02-19 NOTE — Progress Notes (Signed)
LCSW following for SNF placement.  Patient and son has chosen bed at [facility], pending bed availability.   LCSW will confirm bed and insurance auth.   Carolin Coy [place]

## 2017-02-19 NOTE — Progress Notes (Signed)
Physical Therapy Treatment Patient Details Name: Laurie Collins MRN: [id number] DOB: 01-01-1941 Today's Date: 02/19/2017    History of Present Illness 76 yo female admitted with collitis, hypokalemia. Hx of breast cancer, COPD, DM, chronic back pain.     PT Comments    Pt required increased assistance on today. She also required multimodal cues for safety. Dyspnea and fatigue noted with activity on today. Continue to recommend ST rehab at [facility].    Follow Up Recommendations  SNF     Equipment Recommendations  None recommended by PT    Recommendations for Other Services       Precautions / Restrictions Precautions Precautions: Fall Precaution Comments: port-a-cath Restrictions Weight Bearing Restrictions: No    Mobility  Bed Mobility Overal bed mobility: Needs Assistance Bed Mobility: Supine to Sit;Sit to Supine     Supine to sit: Min assist;HOB elevated Sit to supine: Min guard;HOB elevated   General bed mobility comments: Assist for trunk to upright. Increased time. Pt relied on bedrail as well.   Transfers Overall transfer level: Needs assistance Equipment used: Rolling walker (2 wheeled) Transfers: Sit to/from [facility] Sit to Stand: Mod assist;Min assist Stand pivot transfers: Min assist       General transfer comment: Assist to rise, stabilize, control descent. VCS safety, technique, hand placmeent. Unsteady and legs appear weak. Min assist from [facility], Mod assist from bed.   Ambulation/Gait Ambulation/Gait assistance: Min assist Ambulation Distance (Feet): 5 Feet Assistive device: Rolling walker (2 wheeled) Gait Pattern/deviations: Step-through pattern;Decreased stride length     General Gait Details: Pt was very unsteady on today. Noted bil LE instability as well. Dyspnea and fatigue with just taking a few steps in her room.    Stairs            Wheelchair Mobility    Modified Rankin (Stroke Patients Only)        Balance                                            Cognition Arousal/Alertness: Awake/alert Behavior During Therapy: Anxious Overall Cognitive Status: No family/caregiver present to determine baseline cognitive functioning                                        Exercises      General Comments        Pertinent Vitals/Pain Pain Assessment: Faces Faces Pain Scale: Hurts little more Pain Location: chronic back pain Pain Intervention(s): Limited activity within patient's tolerance;Repositioned    Home Living                      Prior Function            PT Goals (current goals can now be found in the care plan section) Progress towards PT goals: Progressing toward goals    Frequency    Min 3X/week      PT Plan Current plan remains appropriate    Co-evaluation              AM-PAC PT "6 Clicks" Daily Activity  Outcome Measure  Difficulty turning over in bed (including adjusting bedclothes, sheets and blankets)?: Unable Difficulty moving from lying on back to sitting on the side of the bed? : Unable  Difficulty sitting down on and standing up from a chair with arms (e.g., wheelchair, bedside commode, etc,.)?: Unable Help needed moving to and from a bed to chair (including a wheelchair)?: A Lot Help needed walking in hospital room?: A Lot Help needed climbing 3-5 steps with a railing? : A Lot 6 Click Score: 9    End of Session Equipment Utilized During Treatment: Gait belt Activity Tolerance: Patient limited by fatigue Patient left: in bed;with call bell/phone within reach;with bed alarm set   PT Visit Diagnosis: Muscle weakness (generalized) (M62.81);Difficulty in walking, not elsewhere classified (R26.2)     Time: 3790-2409 PT Time Calculation (min) (ACUTE ONLY): 11 min  Charges:  $Therapeutic Activity: 8-22 mins                    G Codes:          Weston Anna, MPT Pager: [phone]

## 2017-02-19 NOTE — Progress Notes (Signed)
PROGRESS NOTE    Laurie Collins  ZOX:[id number] DOB: 08-18-1940 DOA: 02/15/2017 PCP: [name], MD    Brief Narrative:  Ms. 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 Presented to the emergency room due to diarrhea for few days along with bilateral leg weakness and trouble walking for about a week she was just seen in the cancer center 3 days prior to admission, started on fluconazole for possible thrush. n the emergency room she was noted to have a white count of 30,000, with elevated lactic acid, hypokalemia, unremarkable CT abdomen and pelvis  Assessment & Plan:   Principal Problem:   Colitis Active Problems:   Hypokalemia   Abnormal liver function  1.Diarrhea -resolved -Suspect this is primarily secondary to her chemotherapy, both Cytoxan and Adriamycin can cause diarrhea and mucositis, CT with minimal thickening of rectal wall (suspected due to underdistention). -WBC significantly elevated however she received Neulasta 2 weeks ago which could be contributing -Follow-up C. Difficile PCR positive but toxin negative and hence indeterminate and GI pathogen panel negative -stable off ABx, diarrhea improved -Advanced diet, abdominal exam is benign -ambulate, PT eval completed SNF recommended  2. Weakness -likely secondary to dehydration, cancer, she does describe some symptoms of peripheral neuropathy which are likely again related to  Chemotherapy -Neuro exam is nonfocal -CT L-spine notes some degenerative changes, no acute findings -hydrated, physical therapy consult requested, SNF recommended  3. Bilateral breast cancer with stage IIIB on the left -recent chemotherapy -notify Dr.Gudena, who saw her in house  4. Hypokalemia -Due to recent diarrhea, replaced  5. Mild transaminitis -could also be from  chemotherapy, monitor -CT unremarkable for any hepatobiliary findings  6. DM -on Glimepiride and metformin at home -Continue sliding-scale insulin, metformin can also cause diarrhea, hold for 1 week post discharge -Hold oral hypoglycemics:  7. HTN -continue amlodipine and metoprolol  8. 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 Include things that cannot be auto populated i.e. Echo, Carotid and venous dopplers, Foley, Bipap, HD, tubes/drains, wound vac, central lines etc)  none  Antimicrobials: (specify start and planned stop date. Auto populated tables are space occupying and do not give end dates) Anti-infectives (From admission, onward)   Start     Dose/Rate Route Frequency Ordered Stop   02/18/17 0000  fluconazole (DIFLUCAN) 200 MG tablet     200 mg Oral Daily 02/18/17 0857     02/16/17 1200  ciprofloxacin (CIPRO) IVPB 400 mg  Status:  Discontinued     400 mg 200 mL/hr over 60 Minutes Intravenous Every 12 hours 02/16/17 0417 02/17/17 1458   02/16/17 1000  fluconazole (DIFLUCAN) tablet 200 mg     200 mg Oral Daily 02/16/17 0417     02/16/17 1000  metroNIDAZOLE (FLAGYL) IVPB 500 mg  Status:  Discontinued     500 mg 100 mL/hr over 60 Minutes Intravenous Every 8 hours 02/16/17 0417 02/17/17 1458   02/16/17 0100  ciprofloxacin (CIPRO) IVPB 400 mg     400 mg 200 mL/hr over 60 Minutes Intravenous  Once 02/16/17 0054 02/16/17 0213   02/16/17 0100  metroNIDAZOLE (FLAGYL) IVPB 500 mg     500  mg 100 mL/hr over 60 Minutes Intravenous  Once 02/16/17 0054 02/16/17 0200         Subjective: No complaints. Diarrhea improved.   Objective: Vitals:   02/18/17 2151 02/19/17 0312 02/19/17 0918 02/19/17 1431  BP: 122/62 (!) 149/72 (!) 143/71 (!) 141/72   Pulse: 80 80 89 82  Resp:  18    Temp: 97.9 F (36.6 C) (!) 97.4 F (36.3 C)  98.7 F (37.1 C)  TempSrc: Oral Oral  Oral  SpO2: 97% 96% 99% 97%  Weight:      Height:        Intake/Output Summary (Last 24 hours) at 02/19/2017 1512 Last data filed at 02/18/2017 1600 Gross per 24 hour  Intake 128.33 ml  Output -  Net 128.33 ml   Filed Weights   02/15/17 2246 02/16/17 0422  Weight: 73.5 kg (162 lb) 78.3 kg (172 lb 9.9 oz)    Examination:  General exam: Appears calm and comfortable  Respiratory system: Clear to auscultation. Respiratory effort normal. Cardiovascular system: S1 & S2 heard, RRR. No JVD, murmurs, rubs, gallops or clicks. No pedal edema. Gastrointestinal system: Abdomen is nondistended, soft and nontender. No organomegaly or masses felt. Normal bowel sounds heard. Central nervous system: Alert and oriented. No focal neurological deficits. Extremities: Moving all extremities equally Skin: No rashes, lesions or ulcers Psychiatry: Judgement and insight appear normal. Mood & affect appropriate.   Data Reviewed: I have personally reviewed following labs and imaging studies  CBC: Recent Labs  Lab 02/15/17 2340 02/16/17 0004 02/16/17 0657 02/17/17 0451 02/18/17 0955  WBC 30.3*  --  27.4* 21.7* 24.7*  NEUTROABS 28.5*  --   --   --   --   HGB 10.9* 10.9* 11.1* 9.9* 11.2*  HCT 34.1* 32.0* 35.1* 30.7* 34.5*  MCV 83.6  --  84.0 83.0 82.7  PLT 240  --  228 193 809   Basic Metabolic Panel: Recent Labs  Lab 02/15/17 2340 02/16/17 0004 02/16/17 0657 02/16/17 2046 02/17/17 0451 02/18/17 0955  NA 140 140 138 140 139 140  K 2.8* 3.4* 3.2* 3.3* 3.3* 3.4*  CL 102 108 101 103 105 102  CO2 24  --  25 27 25 26   GLUCOSE 115* 115* 232* 86 174* 112*  BUN 41* 48* 31* 33* 32* 28*  CREATININE 1.37* 1.40* 1.05* 1.34* 1.03* 1.03*  CALCIUM 9.0  --  8.5* 8.3* 8.3* 8.5*   GFR: Estimated Creatinine Clearance: 46.1 mL/min (A) (by C-G formula based on SCr of 1.03 mg/dL  (H)). Liver Function Tests: Recent Labs  Lab 02/15/17 2340 02/16/17 0657  AST 52* 48*  ALT 69* 71*  ALKPHOS 64 63  BILITOT 0.5 0.6  PROT 6.4* 6.5  ALBUMIN 3.2* 3.3*   No results for input(s): LIPASE, AMYLASE in the last 168 hours. No results for input(s): AMMONIA in the last 168 hours. Coagulation Profile: No results for input(s): INR, PROTIME in the last 168 hours. Cardiac Enzymes: No results for input(s): CKTOTAL, CKMB, CKMBINDEX, TROPONINI in the last 168 hours. BNP (last 3 results) No results for input(s): PROBNP in the last 8760 hours. HbA1C: No results for input(s): HGBA1C in the last 72 hours. CBG: Recent Labs  Lab 02/18/17 1656 02/18/17 2300 02/19/17 0308 02/19/17 0731 02/19/17 1143  GLUCAP 285* 334* 242* 157* 158*   Lipid Profile: No results for input(s): CHOL, HDL, LDLCALC, TRIG, CHOLHDL, LDLDIRECT in the last 72 hours. Thyroid Function Tests: No results for input(s): TSH, T4TOTAL, FREET4, T3FREE, THYROIDAB in  the last 72 hours. Anemia Panel: No results for input(s): VITAMINB12, FOLATE, FERRITIN, TIBC, IRON, RETICCTPCT in the last 72 hours. Sepsis Labs: Recent Labs  Lab 02/16/17 0003  LATICACIDVEN 3.85*    Recent Results (from the past 240 hour(s))  TECHNOLOGIST REVIEW     Status: None   Collection Time: 02/12/17  9:49 AM  Result Value Ref Range Status   Technologist Review   Final    Metas, Myelocytes, and Promyelocytes present, Sl poly, Few teardrop and ovalos, 3% Nrbcs  Blood Culture (routine x 2)     Status: None (Preliminary result)   Collection Time: 02/15/17 11:41 PM  Result Value Ref Range Status   Specimen Description BLOOD RIGHT PORTA CATH  Final   Special Requests   Final    BOTTLES DRAWN AEROBIC AND ANAEROBIC Blood Culture adequate volume   Culture   Final    NO GROWTH 3 DAYS Performed at [facility], [address] [place], [place] [address]    Report Status PENDING  Incomplete  Urine culture     Status: Abnormal    Collection Time: 02/16/17  1:20 AM  Result Value Ref Range Status   Specimen Description URINE, RANDOM  Final   Special Requests NONE  Final   Culture MULTIPLE SPECIES PRESENT, SUGGEST RECOLLECTION (A)  Final   Report Status 02/17/2017 FINAL  Final  Blood Culture (routine x 2)     Status: None (Preliminary result)   Collection Time: 02/16/17  6:47 AM  Result Value Ref Range Status   Specimen Description BLOOD RIGHT ANTECUBITAL  Final   Special Requests IN PEDIATRIC BOTTLE Blood Culture adequate volume  Final   Culture   Final    NO GROWTH 3 DAYS Performed at [facility], [place] [address], [place],  [address]    Report Status PENDING  Incomplete  C difficile quick scan w PCR reflex     Status: Abnormal   Collection Time: 02/17/17  8:45 AM  Result Value Ref Range Status   C Diff antigen POSITIVE (A) NEGATIVE Final   C Diff toxin NEGATIVE NEGATIVE Final   C Diff interpretation Results are indeterminate. See PCR results.  Final  Gastrointestinal Panel by PCR , Stool     Status: None   Collection Time: 02/17/17  8:45 AM  Result Value Ref Range Status   Campylobacter species NOT DETECTED NOT DETECTED Final   Plesimonas shigelloides NOT DETECTED NOT DETECTED Final   Salmonella species NOT DETECTED NOT DETECTED Final   Yersinia enterocolitica NOT DETECTED NOT DETECTED Final   Vibrio species NOT DETECTED NOT DETECTED Final   Vibrio cholerae NOT DETECTED NOT DETECTED Final   Enteroaggregative E coli (EAEC) NOT DETECTED NOT DETECTED Final   Enteropathogenic E coli (EPEC) NOT DETECTED NOT DETECTED Final   Enterotoxigenic E coli (ETEC) NOT DETECTED NOT DETECTED Final   Shiga like toxin producing E coli (STEC) NOT DETECTED NOT DETECTED Final   Shigella/Enteroinvasive E coli (EIEC) NOT DETECTED NOT DETECTED Final   Cryptosporidium NOT DETECTED NOT DETECTED Final   Cyclospora cayetanensis NOT DETECTED NOT DETECTED Final   Entamoeba histolytica NOT DETECTED NOT DETECTED Final    Giardia lamblia NOT DETECTED NOT DETECTED Final   Adenovirus F40/41 NOT DETECTED NOT DETECTED Final   Astrovirus NOT DETECTED NOT DETECTED Final   Norovirus GI/GII NOT DETECTED NOT DETECTED Final   Rotavirus A NOT DETECTED NOT DETECTED Final   Sapovirus (I, II, IV, and V) NOT DETECTED NOT DETECTED  Final         Radiology Studies: No results found.      Scheduled Meds: . amLODipine  10 mg Oral Daily  . aspirin  81 mg Oral Daily  . dexamethasone  4 mg Oral Daily  . enoxaparin (LOVENOX) injection  40 mg Subcutaneous Q24H  . fluconazole  200 mg Oral Daily  . insulin aspart  0-9 Units Subcutaneous TID WC  . metoprolol succinate  100 mg Oral Daily  . simvastatin  10 mg Oral QHS   Continuous Infusions: . sodium chloride 50 mL/hr at 02/18/17 1900     LOS: 3 days    Time spent: over 30 min    Fayrene Helper, MD [facility] Pager [phone]  If 7PM-7AM, please contact night-coverage [email or web address] Password [email or web address] 02/19/2017, 3:12 PM

## 2017-02-20 DIAGNOSIS — K591 Functional diarrhea: Secondary | ICD-10-CM | POA: Diagnosis not present

## 2017-02-20 DIAGNOSIS — M549 Dorsalgia, unspecified: Secondary | ICD-10-CM | POA: Diagnosis not present

## 2017-02-20 DIAGNOSIS — K529 Noninfective gastroenteritis and colitis, unspecified: Principal | ICD-10-CM

## 2017-02-20 DIAGNOSIS — F419 Anxiety disorder, unspecified: Secondary | ICD-10-CM | POA: Diagnosis not present

## 2017-02-20 DIAGNOSIS — B370 Candidal stomatitis: Secondary | ICD-10-CM | POA: Diagnosis not present

## 2017-02-20 DIAGNOSIS — C50912 Malignant neoplasm of unspecified site of left female breast: Secondary | ICD-10-CM | POA: Diagnosis not present

## 2017-02-20 DIAGNOSIS — E119 Type 2 diabetes mellitus without complications: Secondary | ICD-10-CM | POA: Diagnosis not present

## 2017-02-20 DIAGNOSIS — E785 Hyperlipidemia, unspecified: Secondary | ICD-10-CM | POA: Diagnosis not present

## 2017-02-20 DIAGNOSIS — R1084 Generalized abdominal pain: Secondary | ICD-10-CM | POA: Diagnosis not present

## 2017-02-20 DIAGNOSIS — R531 Weakness: Secondary | ICD-10-CM | POA: Diagnosis not present

## 2017-02-20 DIAGNOSIS — I10 Essential (primary) hypertension: Secondary | ICD-10-CM | POA: Diagnosis not present

## 2017-02-20 DIAGNOSIS — E1165 Type 2 diabetes mellitus with hyperglycemia: Secondary | ICD-10-CM | POA: Diagnosis not present

## 2017-02-20 DIAGNOSIS — R5381 Other malaise: Secondary | ICD-10-CM | POA: Diagnosis not present

## 2017-02-20 DIAGNOSIS — R262 Difficulty in walking, not elsewhere classified: Secondary | ICD-10-CM | POA: Diagnosis not present

## 2017-02-20 DIAGNOSIS — L98412 Non-pressure chronic ulcer of buttock with fat layer exposed: Secondary | ICD-10-CM | POA: Diagnosis not present

## 2017-02-20 DIAGNOSIS — L98419 Non-pressure chronic ulcer of buttock with unspecified severity: Secondary | ICD-10-CM | POA: Diagnosis not present

## 2017-02-20 DIAGNOSIS — M6281 Muscle weakness (generalized): Secondary | ICD-10-CM | POA: Diagnosis not present

## 2017-02-20 DIAGNOSIS — R197 Diarrhea, unspecified: Secondary | ICD-10-CM | POA: Diagnosis not present

## 2017-02-20 DIAGNOSIS — G8929 Other chronic pain: Secondary | ICD-10-CM | POA: Diagnosis not present

## 2017-02-20 LAB — GLUCOSE, CAPILLARY
GLUCOSE-CAPILLARY: 279 mg/dL — AB (ref 65–99)
Glucose-Capillary: 158 mg/dL — ABNORMAL HIGH (ref 65–99)
Glucose-Capillary: 150 mg/dL — ABNORMAL HIGH (ref 65–99)
Glucose-Capillary: 180 mg/dL — ABNORMAL HIGH (ref 65–99)

## 2017-02-20 LAB — CBC
HEMATOCRIT: 33.3 % — AB (ref 36.0–46.0)
Hemoglobin: 10.7 g/dL — ABNORMAL LOW (ref 12.0–15.0)
MCH: 26.8 pg (ref 26.0–34.0)
MCHC: 32.1 g/dL (ref 30.0–36.0)
MCV: 83.3 fL (ref 78.0–100.0)
Platelets: 219 10*3/uL (ref 150–400)
RBC: 4 MIL/uL (ref 3.87–5.11)
RDW: 17.4 % — AB (ref 11.5–15.5)
WBC: 17.3 10*3/uL — ABNORMAL HIGH (ref 4.0–10.5)

## 2017-02-20 MED ORDER — HEPARIN SOD (PORK) LOCK FLUSH 100 UNIT/ML IV SOLN
500.0000 [IU] | INTRAVENOUS | Status: AC | PRN
  Administered 2017-02-20: 500 [IU]

## 2017-02-20 NOTE — Progress Notes (Signed)
Escorted off unit by [facility] at 2014.  No complaints noted

## 2017-02-20 NOTE — Clinical Social Work Placement (Signed)
     2:48 PM Patient and family chose bed at [facility]. LCSW confirmed bed with facility. Patient to be transported by [facility]. RN made aware of report [phone] LCSW faxed all DC paperwork via the [facility].  LCSW signing off. No Further needs at this time.   CLINICAL SOCIAL WORK PLACEMENT  NOTE  Date:  02/20/2017  Patient Details  Name: Laurie Collins MRN: [id number] Date of Birth: 07/28/1940  Clinical Social Work is seeking post-discharge placement for this patient at the [place] level of care (*CSW will initial, date and re-position this form in  chart as items are completed):  Yes   Patient/family provided with [facility] Work Department's list of facilities offering this level of care within the geographic area requested by the patient (or if unable, by the patient's family).  Yes   Patient/family informed of their freedom to choose among providers that offer the needed level of care, that participate in Medicare, Medicaid or managed care program needed by the patient, have an available bed and are willing to accept the patient.  Yes   Patient/family informed of [facility]'s ownership interest in [facility] and [facility], as well as of the fact that they are under no obligation to receive care at these facilities.  PASRR submitted to EDS on       PASRR number received on 02/19/17     Existing PASRR number confirmed on       FL2 transmitted to all facilities in geographic area requested by pt/family on 02/19/17     FL2 transmitted to all facilities within larger geographic area on 02/19/17     Patient informed that his/her managed care company has contracts with or will negotiate with certain facilities, including the following:        Yes   Patient/family informed of bed offers received.  Patient chooses bed at [facility]     Physician recommends and patient chooses bed at [facility]      Patient to be transferred to [facility] on 02/20/17.  Patient to be transferred to facility by EMS     Patient family notified on 02/20/17 of transfer.  Name of family member notified:  Konrad Dolores, Son     PHYSICIAN       Additional Comment:    _______________________________________________ Servando Snare, LCSW 02/20/2017, 2:48 PM

## 2017-02-20 NOTE — Progress Notes (Signed)
Discussed with patient discharge instructions, she verbalized agreement and understanding.  Report called to [facility] to [facility].  Patient to go by [facility] with all belongings.

## 2017-02-20 NOTE — Progress Notes (Signed)
Date: February 20, 2017 Rhonda Davis, BSN, RN3, CCM  [phone] Chart and notes review for patient progress and needs. Will follow for case management and discharge needs. Next review date: [id number] 

## 2017-02-20 NOTE — Discharge Summary (Signed)
Physician Discharge Summary  Laurie Collins MHD:[id number] DOB: 1940-11-24 DOA: 02/15/2017  PCP: [name], MD  Admit date: 02/15/2017 Discharge date: 02/20/2017  Time spent: over 30 minutes  Recommendations for Outpatient Follow-up:  1. Follow up outpatient CBC/CMP  2. Continue to monitor sx (diarrhea) 3. Follow up with oncology and PCP 4. Follow up possible adrenal adenoma 5. 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 Presented to the emergency room due to diarrhea for few days along with bilateral leg weakness and trouble walking for about Seraphine Gudiel week she was just seen in the cancer center 3 days prior to admission, started on fluconazole for possible thrush. n the emergency room she was noted to have Kethan Papadopoulos white count of 30,000, with elevated lactic acid, hypokalemia, unremarkable CT abdomen and pelvis  Hospital Course:  1.Diarrhea -resolved -Suspect this is primarily secondary to her chemotherapy, both Cytoxan and Adriamycin can cause diarrhea and mucositis, CT with minimal thickening of rectal wall (suspected due to underdistention). -WBC significantly elevated however she received Neulasta 2 weeks ago which could be contributing -Follow-up C. Difficile PCRpositive but toxin negative and hence indeterminateand GI pathogen panel negative -stable off ABx, diarrhea  improved -Advanced diet, abdominal exam is benign -ambulate, PT evalcompleted SNF recommended  2. Weakness -likely secondary to dehydration,cancer,she does describe some symptoms of peripheral neuropathy which are likely again related to Chemotherapy -Neuro exam is nonfocal -CT L-spine notes some degenerative changes, no acute findings -hydrated, physical therapy consult requested, SNF recommended  3. Bilateral breast cancer with stage IIIB on the left -recent chemotherapy -notify Dr.Gudena, who saw her in house  4. Hypokalemia -Due to recent diarrhea, replaced  5. Mild transaminitis -could also be from chemotherapy, monitor -CT unremarkable for any hepatobiliary findings  6. DM -on Glimepirideand metformin at home -Continue sliding-scale insulin, metformin can also cause diarrhea, instructed to restart gradually after discharge -Hold oral hypoglycemics:  7. HTN -continue amlodipine and metoprolol  8. 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 -needs surveillance   Procedures:  none   Consultations:  oncology  Discharge Exam: Vitals:   02/19/17 2224 02/20/17 0300  BP: 137/79 123/72  Pulse: (!) 102 75  Resp: 20 18  Temp: 98.7 F (37.1 C) (!) 97.5 F (36.4 C)  SpO2: 100% 99%   Feeling better.  Feels ready to go.  No abdominal pain, diarrhea, fevers, chills.   General: No acute distress. Cardiovascular: Heart sounds show Shirlette Scarber regular rate, and rhythm. No gallops or rubs. No murmurs. No JVD. Lungs: Clear to auscultation bilaterally with good air movement. No rales, rhonchi or wheezes. Abdomen: Soft, nontender, nondistended with normal active bowel sounds. No masses. No hepatosplenomegaly. Neurological: Alert and oriented 3. Moves all extremities 4 with equal strength. Cranial nerves II through XII grossly intact. Skin: Warm and dry. No rashes or lesions. Extremities: No clubbing or cyanosis. No edema. Pedal pulses 2+. Psychiatric: Mood and affect are  normal. Insight  and judgment are appropriate.  Discharge Instructions   Discharge Instructions    Call MD for:  difficulty breathing, headache or visual disturbances   Complete by:  As directed    Call MD for:  persistant dizziness or light-headedness   Complete by:  As directed    Call MD for:  persistant nausea and vomiting   Complete by:  As directed    Call MD for:  redness, tenderness, or signs of infection (pain, swelling, redness, odor or green/yellow discharge around incision site)   Complete by:  As directed    Call MD for:  severe uncontrolled pain   Complete by:  As directed    Call MD for:  temperature >100.4   Complete by:  As directed    Diet - low sodium heart healthy   Complete by:  As directed    Diet - low sodium heart healthy   Complete by:  As directed    Discharge instructions   Complete by:  As directed    You were seen for diarrhea.  We think this was probably related to your chemotherapy.  You've continued to improve with fluids and your diarrhea seems to have improved.  Please follow up with your PCP in about Llewyn Heap week and follow up with your oncologist as well.  Please return if worsening or new or recurrent symptoms.  Restart your metformin gradually this can cause diarrhea.  Follow up with your PCP and oncology for repeat labs and to follow up imaging from admission.   Increase activity slowly   Complete by:  As directed    Increase activity slowly   Complete by:  As directed      Current Discharge Medication List    CONTINUE these medications which have CHANGED   Details  fluconazole (DIFLUCAN) 200 MG tablet Take 1 tablet (200 mg total) daily by mouth. Stop after 3days Refills: 0   Associated Diagnoses: Thrush      CONTINUE these medications which have NOT CHANGED   Details  amLODipine (NORVASC) 10 MG tablet Take 10 mg by mouth daily.    aspirin 81 MG chewable tablet Chew 81 mg by mouth daily.    Cholecalciferol (VITAMIN D) 2000 units tablet Take 2,000 Units by  mouth daily.    dexamethasone (DECADRON) 4 MG tablet Take 2 tablets (8 mg total) by mouth daily. Qty: 60 tablet, Refills: 1   Associated Diagnoses: Malignant neoplasm of upper-inner quadrant of left breast in female, estrogen receptor negative (HCC)    glimepiride (AMARYL) 1 MG tablet Take 1 mg by mouth daily with breakfast.    lisinopril (PRINIVIL,ZESTRIL) 20 MG tablet Take 20 mg by mouth daily.    loperamide (IMODIUM) 2 MG capsule Take 2 mg by mouth every 4 (four) hours as needed for diarrhea or loose stools.     magic mouthwash SOLN Take 5 mLs by mouth 3 (three) times daily as needed for mouth pain. Qty: 240 mL, Refills: 0   Associated Diagnoses: Thrush    metFORMIN (GLUCOPHAGE) 1000 MG tablet Take 1,000 mg by mouth 2 (two) times daily with Kirandeep Fariss meal.    metoprolol succinate (TOPROL-XL) 100 MG 24 hr tablet Take 100 mg by mouth daily.    simvastatin (ZOCOR) 10 MG tablet Take 10 mg by mouth at bedtime.    Triamcinolone Acetonide (TRIAMCINOLONE 0.1 % CREAM : EUCERIN) CREA Apply 1 application topically 2 (two) times daily as needed. Qty: 1 each, Refills: 1   Associated  Diagnoses: Malignant neoplasm of upper-inner quadrant of left breast in female, estrogen receptor negative (HCC)    albuterol (PROVENTIL HFA;VENTOLIN HFA) 108 (90 Base) MCG/ACT inhaler Inhale 2 puffs into the lungs every 6 (six) hours as needed for wheezing or shortness of breath. Qty: 1 Inhaler, Refills: 2    tiotropium (SPIRIVA HANDIHALER) 18 MCG inhalation capsule Place 1 capsule (18 mcg total) into inhaler and inhale daily. Qty: 30 capsule, Refills: 2      STOP taking these medications     hydrochlorothiazide (MICROZIDE) 12.5 MG capsule      predniSONE (DELTASONE) 20 MG tablet        Allergies  Allergen Reactions  . Doxycycline Anaphylaxis  . Penicillins Hives and Swelling    Has patient had Bren Steers PCN reaction causing immediate rash, facial/tongue/throat swelling, SOB or lightheadedness with hypotension:  Yes Has patient had Salil Raineri PCN reaction causing severe rash involving mucus membranes or skin necrosis: No Has patient had Shavone Nevers PCN reaction that required hospitalization: No Has patient had Alaster Asfaw PCN reaction occurring within the last 10 years: No If all of the above answers are "NO", then may proceed with Cephalosporin use.       The results of significant diagnostics from this hospitalization (including imaging, microbiology, ancillary and laboratory) are listed below for reference.    Significant Diagnostic Studies: Dg Chest 2 View  Result Date: 02/15/2017 CLINICAL DATA:  76 year old female with weakness. EXAM: CHEST  2 VIEW COMPARISON:  Chest CT dated 01/03/2017 FINDINGS: Right pectoral Port-Rutledge Selsor-Cath with the tip over central SVC. The lungs are clear. There is no pleural effusion or pneumothorax. The cardiac silhouette is within normal limits. No acute osseous pathology. IMPRESSION: No active cardiopulmonary disease. Electronically Signed   By: Anner Crete M.D.   On: 02/15/2017 23:35   Ct Abdomen Pelvis W Contrast  Result Date: 02/16/2017 CLINICAL DATA:  76 year old female with abdominal distention and pain. History of breast cancer. EXAM: CT ABDOMEN AND PELVIS WITH CONTRAST TECHNIQUE: Multidetector CT imaging of the abdomen and pelvis was performed using the standard protocol following bolus administration of intravenous contrast. CONTRAST:  44mL ISOVUE-300 IOPAMIDOL (ISOVUE-300) INJECTION 61% COMPARISON:  Abdominal CT dated 09/20/2016 FINDINGS: Lower chest: [place] List 3 mm calcified granuloma in the left posterior lung base. The visualized lung bases are otherwise clear. No intra-abdominal free air or free fluid. Hepatobiliary: No focal liver abnormality is seen. No gallstones, gallbladder wall thickening, or biliary dilatation. Pancreas: Unremarkable. No pancreatic ductal dilatation or surrounding inflammatory changes. Spleen: Normal in size without focal abnormality. Adrenals/Urinary Tract: Subcentimeter  hypodense nodule in the left adrenal gland, indeterminate, likely an adenoma. The right adrenal gland is unremarkable. The visualized ureters and urinary bladder appear unremarkable. Stomach/Bowel: Minimal thickened appearance of the rectal wall, likely related to underdistention. Mild proctitis or the possibility of underlying mass is less likely but not entirely excluded. Correlation with clinical exam recommended. There is Ariyah Sedlack 1.5 cm duodenal diverticulum. There is no bowel obstruction or active inflammation. Normal appendix. Vascular/Lymphatic: There is moderate aortoiliac atherosclerotic disease. No aneurysmal dilatation or evidence of dissection. The IVC is grossly unremarkable. The SMV, splenic vein, and main portal vein are patent. No portal venous gas. There is no adenopathy. Reproductive: The uterus is anteverted. Small calcified posterior fundal fibroid noted. The ovaries are grossly unremarkable. Other: None Musculoskeletal: There is degenerative changes of the spine. No acute osseous pathology. No CT evidence of osseous metastatic disease. IMPRESSION: 1. Minimal apparent thickening of the rectal wall, likely related to underdistention. Mild  inflammatory changes or underline lesion is not entirely excluded. Correlation with clinical exam recommended. No bowel obstruction. Normal appendix. 2. No intra-abdominal or pelvic adenopathy or evidence of metastatic disease within the abdomen or pelvis. 3. Subcentimeter left adrenal hypodense lesion, similar to prior CT, likely an adenoma. 4.  Aortic Atherosclerosis (ICD10-I70.0). Electronically Signed   By: Anner Crete M.D.   On: 02/16/2017 01:19   Ct L-spine No Charge  Result Date: 02/16/2017 CLINICAL DATA:  Initial evaluation for back pain. EXAM: CT LUMBAR SPINE WITHOUT CONTRAST TECHNIQUE: Multidetector CT imaging of the lumbar spine was performed without intravenous contrast administration. Multiplanar CT image reconstructions were also generated.  COMPARISON:  None available. FINDINGS: Segmentation: Normal segmentation. Lowest well-formed disc labeled the L5-S1 level. Alignment: Trace anterolisthesis of L4 on L5 and L5 on S1. Vertebral bodies otherwise normally aligned with preservation of the normal lumbar lordosis. Vertebrae: Vertebral body heights maintained. No evidence for acute or chronic fracture. No discrete lytic or blastic osseous lesions. Visualized sacrum and pelvis intact. SI joints fairly symmetric and within normal limits. Paraspinal and other soft tissues: Chronic atrophy noted within the lower paraspinous musculature. Paraspinous soft tissues demonstrate no acute abnormality. Calcified uterine fibroid noted. Aortic atherosclerosis. Visualized visceral structures otherwise unremarkable. Disc levels: L1-2: Minimal disc bulge. Mild bilateral facet hypertrophy. No stenosis. L2-3: Minimal annular disc bulge. Moderate facet hypertrophy, worse on the right. No significant canal stenosis. Mild bilateral foraminal narrowing, slightly greater on the right. L3-4: Mild diffuse disc bulge. Moderate facet and ligamentum flavum hypertrophy. No significant canal stenosis. Mild bilateral L3 foraminal narrowing. L4-5: Trace anterolisthesis. Broad posterior disc bulge. Advanced facet arthrosis. Resultant mild to moderate canal and subarticular stenosis. Mild to moderate bilateral foraminal narrowing. L5-S1: Trace anterolisthesis. No significant disc bulge. Advanced facet arthrosis. No significant stenosis. IMPRESSION: 1. No acute abnormality within the lumbar spine. 2. Trace anterolisthesis of L4 on L5 and L5 on S1 with associated advanced facet arthropathy. Finding could serve as Santina Trillo source for low back pain. 3. Mild to moderate canal and bilateral foraminal stenosis at L4-5 due to disc bulge and facet disease. 4. Additional more mild multilevel degenerative spondylolysis as above. Electronically Signed   By: Jeannine Boga M.D.   On: 02/16/2017 01:25     Microbiology: Recent Results (from the past 240 hour(s))  TECHNOLOGIST REVIEW     Status: None   Collection Time: 02/12/17  9:49 AM  Result Value Ref Range Status   Technologist Review   Final    Metas, Myelocytes, and Promyelocytes present, Sl poly, Few teardrop and ovalos, 3% Nrbcs  Blood Culture (routine x 2)     Status: None (Preliminary result)   Collection Time: 02/15/17 11:41 PM  Result Value Ref Range Status   Specimen Description BLOOD RIGHT PORTA CATH  Final   Special Requests   Final    BOTTLES DRAWN AEROBIC AND ANAEROBIC Blood Culture adequate volume   Culture   Final    NO GROWTH 4 DAYS Performed at [facility], [address], [place], [place] [address]    Report Status PENDING  Incomplete  Urine culture     Status: Abnormal   Collection Time: 02/16/17  1:20 AM  Result Value Ref Range Status   Specimen Description URINE, RANDOM  Final   Special Requests NONE  Final   Culture MULTIPLE SPECIES PRESENT, SUGGEST RECOLLECTION (Yuji Walth)  Final   Report Status 02/17/2017 FINAL  Final  Blood Culture (routine x 2)     Status:  None (Preliminary result)   Collection Time: 02/16/17  6:47 AM  Result Value Ref Range Status   Specimen Description BLOOD RIGHT ANTECUBITAL  Final   Special Requests IN PEDIATRIC BOTTLE Blood Culture adequate volume  Final   Culture   Final    NO GROWTH 4 DAYS Performed at [facility], [place] [address] [place], [place] [address]    Report Status PENDING  Incomplete  C difficile quick scan w PCR reflex     Status: Abnormal   Collection Time: 02/17/17  8:45 AM  Result Value Ref Range Status   C Diff antigen POSITIVE (Levern Pitter) NEGATIVE Final   C Diff toxin NEGATIVE NEGATIVE Final   C Diff interpretation Results are indeterminate. See PCR results.  Final  Gastrointestinal Panel by PCR , Stool     Status: None   Collection Time: 02/17/17  8:45 AM  Result Value Ref Range Status   Campylobacter species NOT DETECTED NOT DETECTED Final    Plesimonas shigelloides NOT DETECTED NOT DETECTED Final   Salmonella species NOT DETECTED NOT DETECTED Final   Yersinia enterocolitica NOT DETECTED NOT DETECTED Final   Vibrio species NOT DETECTED NOT DETECTED Final   Vibrio cholerae NOT DETECTED NOT DETECTED Final   Enteroaggregative E coli (EAEC) NOT DETECTED NOT DETECTED Final   Enteropathogenic E coli (EPEC) NOT DETECTED NOT DETECTED Final   Enterotoxigenic E coli (ETEC) NOT DETECTED NOT DETECTED Final   Shiga like toxin producing E coli (STEC) NOT DETECTED NOT DETECTED Final   Shigella/Enteroinvasive E coli (EIEC) NOT DETECTED NOT DETECTED Final   Cryptosporidium NOT DETECTED NOT DETECTED Final   Cyclospora cayetanensis NOT DETECTED NOT DETECTED Final   Entamoeba histolytica NOT DETECTED NOT DETECTED Final   Giardia lamblia NOT DETECTED NOT DETECTED Final   Adenovirus F40/41 NOT DETECTED NOT DETECTED Final   Astrovirus NOT DETECTED NOT DETECTED Final   Norovirus GI/GII NOT DETECTED NOT DETECTED Final   Rotavirus [place]immitt NOT DETECTED NOT DETECTED Final   Sapovirus (I, II, IV, and V) NOT DETECTED NOT DETECTED Final     Labs: Basic Metabolic Panel: Recent Labs  Lab 02/15/17 2340 02/16/17 0004 02/16/17 0657 02/16/17 2046 02/17/17 0451 02/18/17 0955  NA 140 140 138 140 139 140  K 2.8* 3.4* 3.2* 3.3* 3.3* 3.4*  CL 102 108 101 103 105 102  CO2 24  --  25 27 25 26   GLUCOSE 115* 115* 232* 86 174* 112*  BUN 41* 48* 31* 33* 32* 28*  CREATININE 1.37* 1.40* 1.05* 1.34* 1.03* 1.03*  CALCIUM 9.0  --  8.5* 8.3* 8.3* 8.5*   Liver Function Tests: Recent Labs  Lab 02/15/17 2340 02/16/17 0657  AST 52* 48*  ALT 69* 71*  ALKPHOS 64 63  BILITOT 0.5 0.6  PROT 6.4* 6.5  ALBUMIN 3.2* 3.3*   No results for input(s): LIPASE, AMYLASE in the last 168 hours. No results for input(s): AMMONIA in the last 168 hours. CBC: Recent Labs  Lab 02/15/17 2340 02/16/17 0004 02/16/17 0657 02/17/17 0451 02/18/17 0955 02/20/17 0857  WBC 30.3*  --   27.4* 21.7* 24.7* 17.3*  NEUTROABS 28.5*  --   --   --   --   --   HGB 10.9* 10.9* 11.1* 9.9* 11.2* 10.7*  HCT 34.1* 32.0* 35.1* 30.7* 34.5* 33.3*  MCV 83.6  --  84.0 83.0 82.7 83.3  PLT 240  --  228 193 266 219   Cardiac Enzymes: No results for input(s): CKTOTAL, CKMB, CKMBINDEX, TROPONINI  in the last 168 hours. BNP: BNP (last 3 results) Recent Labs    01/03/17 0343  BNP 366.0*    ProBNP (last 3 results) No results for input(s): PROBNP in the last 8760 hours.  CBG: Recent Labs  Lab 02/19/17 2049 02/20/17 0001 02/20/17 0408 02/20/17 0715 02/20/17 1144  GLUCAP 280* 220* 158* 150* 180*       Signed:  Fayrene Helper MD.  [facility] 02/20/2017, 1:16 PM

## 2017-02-21 DIAGNOSIS — E1165 Type 2 diabetes mellitus with hyperglycemia: Secondary | ICD-10-CM | POA: Diagnosis not present

## 2017-02-21 DIAGNOSIS — R531 Weakness: Secondary | ICD-10-CM | POA: Diagnosis not present

## 2017-02-21 DIAGNOSIS — I10 Essential (primary) hypertension: Secondary | ICD-10-CM | POA: Diagnosis not present

## 2017-02-21 DIAGNOSIS — C50912 Malignant neoplasm of unspecified site of left female breast: Secondary | ICD-10-CM | POA: Diagnosis not present

## 2017-02-21 LAB — CULTURE, BLOOD (ROUTINE X 2)
Culture: NO GROWTH
Culture: NO GROWTH
SPECIAL REQUESTS: ADEQUATE
SPECIAL REQUESTS: ADEQUATE

## 2017-02-27 DIAGNOSIS — M549 Dorsalgia, unspecified: Secondary | ICD-10-CM | POA: Diagnosis not present

## 2017-02-27 DIAGNOSIS — M6281 Muscle weakness (generalized): Secondary | ICD-10-CM | POA: Diagnosis not present

## 2017-02-27 DIAGNOSIS — R262 Difficulty in walking, not elsewhere classified: Secondary | ICD-10-CM | POA: Diagnosis not present

## 2017-02-27 DIAGNOSIS — C50912 Malignant neoplasm of unspecified site of left female breast: Secondary | ICD-10-CM | POA: Diagnosis not present

## 2017-03-03 ENCOUNTER — Ambulatory Visit (HOSPITAL_COMMUNITY): Admit: 2017-03-03 | Payer: Medicare HMO

## 2017-03-04 ENCOUNTER — Telehealth: Payer: Self-pay | Admitting: Hematology and Oncology

## 2017-03-04 ENCOUNTER — Telehealth: Payer: Self-pay | Admitting: *Deleted

## 2017-03-04 NOTE — Telephone Encounter (Signed)
[facility] for update on pt progress. Relate she is doing well, had to start on insulin. Unaware of expected release date. Informed she is participating in therapy and is able to get up and move around. Physician team notified of pt progress. Informed by [facility] that the will call on pt d/c date. Contact information provided.

## 2017-03-04 NOTE — Telephone Encounter (Signed)
Called patients son to reschedule MRI, Marcello Moores advised patient is in a rehab facility off [address]. Advised she isn't getting any better. Advised Dawn the navigator of my findings and she will call Marcello Moores back with an update.

## 2017-03-11 DIAGNOSIS — R262 Difficulty in walking, not elsewhere classified: Secondary | ICD-10-CM | POA: Diagnosis not present

## 2017-03-11 DIAGNOSIS — F419 Anxiety disorder, unspecified: Secondary | ICD-10-CM | POA: Diagnosis not present

## 2017-03-11 DIAGNOSIS — E119 Type 2 diabetes mellitus without complications: Secondary | ICD-10-CM | POA: Diagnosis not present

## 2017-03-11 DIAGNOSIS — R5381 Other malaise: Secondary | ICD-10-CM | POA: Diagnosis not present

## 2017-03-11 DIAGNOSIS — G8929 Other chronic pain: Secondary | ICD-10-CM | POA: Diagnosis not present

## 2017-03-11 DIAGNOSIS — C50912 Malignant neoplasm of unspecified site of left female breast: Secondary | ICD-10-CM | POA: Diagnosis not present

## 2017-03-11 DIAGNOSIS — M6281 Muscle weakness (generalized): Secondary | ICD-10-CM | POA: Diagnosis not present

## 2017-03-12 ENCOUNTER — Other Ambulatory Visit: Payer: Self-pay | Admitting: *Deleted

## 2017-03-12 NOTE — Patient Outreach (Signed)
[facility]) Care Management  03/12/2017  ANY MCNEICE 1940-06-10 [id number]   Met with Marita Kansas, SW at facility.  She reports patient has had a decline.  She has spoken with son, plan is for patient to remain at facility for LTC.  Plan to sign off at this time as no [facility] care management needs due to remaining LTC.  Royetta Crochet. Laymond Purser, RN, BSN, [place] [phone]) Business Cell  [phone]) Toll Free Office

## 2017-03-13 DIAGNOSIS — L98419 Non-pressure chronic ulcer of buttock with unspecified severity: Secondary | ICD-10-CM | POA: Diagnosis not present

## 2017-03-14 DIAGNOSIS — F419 Anxiety disorder, unspecified: Secondary | ICD-10-CM | POA: Diagnosis not present

## 2017-03-14 DIAGNOSIS — C50912 Malignant neoplasm of unspecified site of left female breast: Secondary | ICD-10-CM | POA: Diagnosis not present

## 2017-03-14 DIAGNOSIS — E119 Type 2 diabetes mellitus without complications: Secondary | ICD-10-CM | POA: Diagnosis not present

## 2017-03-19 DIAGNOSIS — R531 Weakness: Secondary | ICD-10-CM | POA: Diagnosis not present

## 2017-03-24 ENCOUNTER — Other Ambulatory Visit: Payer: Self-pay | Admitting: Internal Medicine

## 2017-03-24 NOTE — Progress Notes (Signed)
PT evaluation G-Codes    01/04/17 1240  PT Time Calculation  PT Start Time (ACUTE ONLY) 0955  PT Stop Time (ACUTE ONLY) 1004  PT Time Calculation (min) (ACUTE ONLY) 9 min  PT G-Codes **NOT FOR INPATIENT CLASS**  Functional Assessment Tool Used AM-PAC 6 Clicks Basic Mobility;Clinical judgement  Functional Limitation Mobility: Walking and moving around  Mobility: Walking and Moving Around Current Status (Z6109) CJ  Mobility: Walking and Moving Around Goal Status (U0454) CI  Mobility: Walking and Moving Around Discharge Status (U9811) CI  PT General Charges  $$ ACUTE PT VISIT 1 Visit  PT Evaluation  $PT Eval Low Complexity 1 Low   Carmelia Bake, PT, DPT 03/24/2017 Pager: [phone]

## 2017-03-27 DIAGNOSIS — L98419 Non-pressure chronic ulcer of buttock with unspecified severity: Secondary | ICD-10-CM | POA: Diagnosis not present

## 2017-03-29 ENCOUNTER — Inpatient Hospital Stay (HOSPITAL_COMMUNITY)
Admission: EM | Admit: 2017-03-29 | Discharge: 2017-04-15 | DRG: 871 | Disposition: E | Payer: Medicare HMO | Attending: Pulmonary Disease | Admitting: Pulmonary Disease

## 2017-03-29 ENCOUNTER — Emergency Department (HOSPITAL_COMMUNITY): Payer: Medicare HMO

## 2017-03-29 ENCOUNTER — Other Ambulatory Visit: Payer: Self-pay

## 2017-03-29 ENCOUNTER — Encounter (HOSPITAL_COMMUNITY): Payer: Self-pay | Admitting: *Deleted

## 2017-03-29 ENCOUNTER — Inpatient Hospital Stay (HOSPITAL_COMMUNITY): Payer: Medicare HMO

## 2017-03-29 DIAGNOSIS — Z800 Family history of malignant neoplasm of digestive organs: Secondary | ICD-10-CM | POA: Diagnosis not present

## 2017-03-29 DIAGNOSIS — Y95 Nosocomial condition: Secondary | ICD-10-CM | POA: Diagnosis present

## 2017-03-29 DIAGNOSIS — R6521 Severe sepsis with septic shock: Secondary | ICD-10-CM

## 2017-03-29 DIAGNOSIS — C799 Secondary malignant neoplasm of unspecified site: Secondary | ICD-10-CM | POA: Diagnosis present

## 2017-03-29 DIAGNOSIS — R0603 Acute respiratory distress: Secondary | ICD-10-CM

## 2017-03-29 DIAGNOSIS — Z66 Do not resuscitate: Secondary | ICD-10-CM | POA: Diagnosis not present

## 2017-03-29 DIAGNOSIS — C50211 Malignant neoplasm of upper-inner quadrant of right female breast: Secondary | ICD-10-CM | POA: Diagnosis present

## 2017-03-29 DIAGNOSIS — Z7984 Long term (current) use of oral hypoglycemic drugs: Secondary | ICD-10-CM

## 2017-03-29 DIAGNOSIS — J9600 Acute respiratory failure, unspecified whether with hypoxia or hypercapnia: Secondary | ICD-10-CM | POA: Diagnosis not present

## 2017-03-29 DIAGNOSIS — Z9221 Personal history of antineoplastic chemotherapy: Secondary | ICD-10-CM

## 2017-03-29 DIAGNOSIS — R0989 Other specified symptoms and signs involving the circulatory and respiratory systems: Secondary | ICD-10-CM | POA: Diagnosis not present

## 2017-03-29 DIAGNOSIS — R0602 Shortness of breath: Secondary | ICD-10-CM | POA: Diagnosis not present

## 2017-03-29 DIAGNOSIS — I248 Other forms of acute ischemic heart disease: Secondary | ICD-10-CM | POA: Diagnosis not present

## 2017-03-29 DIAGNOSIS — Z515 Encounter for palliative care: Secondary | ICD-10-CM | POA: Diagnosis not present

## 2017-03-29 DIAGNOSIS — Z880 Allergy status to penicillin: Secondary | ICD-10-CM | POA: Diagnosis not present

## 2017-03-29 DIAGNOSIS — Z7952 Long term (current) use of systemic steroids: Secondary | ICD-10-CM | POA: Diagnosis not present

## 2017-03-29 DIAGNOSIS — Z9911 Dependence on respirator [ventilator] status: Secondary | ICD-10-CM

## 2017-03-29 DIAGNOSIS — J80 Acute respiratory distress syndrome: Secondary | ICD-10-CM | POA: Diagnosis not present

## 2017-03-29 DIAGNOSIS — I10 Essential (primary) hypertension: Secondary | ICD-10-CM | POA: Diagnosis present

## 2017-03-29 DIAGNOSIS — A419 Sepsis, unspecified organism: Principal | ICD-10-CM | POA: Diagnosis present

## 2017-03-29 DIAGNOSIS — J9601 Acute respiratory failure with hypoxia: Secondary | ICD-10-CM | POA: Diagnosis present

## 2017-03-29 DIAGNOSIS — Z7982 Long term (current) use of aspirin: Secondary | ICD-10-CM

## 2017-03-29 DIAGNOSIS — J189 Pneumonia, unspecified organism: Secondary | ICD-10-CM | POA: Diagnosis not present

## 2017-03-29 DIAGNOSIS — Z87891 Personal history of nicotine dependence: Secondary | ICD-10-CM

## 2017-03-29 DIAGNOSIS — Z79899 Other long term (current) drug therapy: Secondary | ICD-10-CM | POA: Diagnosis not present

## 2017-03-29 DIAGNOSIS — E119 Type 2 diabetes mellitus without complications: Secondary | ICD-10-CM | POA: Diagnosis present

## 2017-03-29 DIAGNOSIS — E872 Acidosis: Secondary | ICD-10-CM | POA: Diagnosis not present

## 2017-03-29 DIAGNOSIS — Z01818 Encounter for other preprocedural examination: Secondary | ICD-10-CM

## 2017-03-29 DIAGNOSIS — G9341 Metabolic encephalopathy: Secondary | ICD-10-CM | POA: Diagnosis present

## 2017-03-29 DIAGNOSIS — Z8611 Personal history of tuberculosis: Secondary | ICD-10-CM

## 2017-03-29 DIAGNOSIS — J440 Chronic obstructive pulmonary disease with acute lower respiratory infection: Secondary | ICD-10-CM | POA: Diagnosis present

## 2017-03-29 DIAGNOSIS — Z452 Encounter for adjustment and management of vascular access device: Secondary | ICD-10-CM | POA: Diagnosis not present

## 2017-03-29 LAB — CBC WITH DIFFERENTIAL/PLATELET
BASOS ABS: 0.1 10*3/uL (ref 0.0–0.1)
BASOS PCT: 1 %
Eosinophils Absolute: 0 10*3/uL (ref 0.0–0.7)
Eosinophils Relative: 0 %
HCT: 34.9 % — ABNORMAL LOW (ref 36.0–46.0)
HEMOGLOBIN: 11.2 g/dL — AB (ref 12.0–15.0)
LYMPHS PCT: 34 %
Lymphs Abs: 2.9 10*3/uL (ref 0.7–4.0)
MCH: 27.4 pg (ref 26.0–34.0)
MCHC: 32.1 g/dL (ref 30.0–36.0)
MCV: 85.3 fL (ref 78.0–100.0)
MONOS PCT: 4 %
Monocytes Absolute: 0.3 10*3/uL (ref 0.1–1.0)
NEUTROS ABS: 5.2 10*3/uL (ref 1.7–7.7)
Neutrophils Relative %: 61 %
Platelets: 213 10*3/uL (ref 150–400)
RBC: 4.09 MIL/uL (ref 3.87–5.11)
RDW: 21.6 % — ABNORMAL HIGH (ref 11.5–15.5)
WBC: 8.5 10*3/uL (ref 4.0–10.5)

## 2017-03-29 LAB — URINALYSIS, ROUTINE W REFLEX MICROSCOPIC
BILIRUBIN URINE: NEGATIVE
Glucose, UA: NEGATIVE mg/dL
Ketones, ur: NEGATIVE mg/dL
LEUKOCYTES UA: NEGATIVE
NITRITE: POSITIVE — AB
PH: 5 (ref 5.0–8.0)
Protein, ur: NEGATIVE mg/dL
SPECIFIC GRAVITY, URINE: 1.015 (ref 1.005–1.030)
SQUAMOUS EPITHELIAL / LPF: NONE SEEN

## 2017-03-29 LAB — RESPIRATORY PANEL BY PCR
Adenovirus: NOT DETECTED
Bordetella pertussis: NOT DETECTED
CORONAVIRUS OC43-RVPPCR: NOT DETECTED
Chlamydophila pneumoniae: NOT DETECTED
Coronavirus 229E: NOT DETECTED
Coronavirus HKU1: NOT DETECTED
Coronavirus NL63: NOT DETECTED
INFLUENZA A-RVPPCR: NOT DETECTED
INFLUENZA B-RVPPCR: NOT DETECTED
MYCOPLASMA PNEUMONIAE-RVPPCR: NOT DETECTED
Metapneumovirus: NOT DETECTED
PARAINFLUENZA VIRUS 1-RVPPCR: NOT DETECTED
PARAINFLUENZA VIRUS 4-RVPPCR: NOT DETECTED
Parainfluenza Virus 2: NOT DETECTED
Parainfluenza Virus 3: NOT DETECTED
RESPIRATORY SYNCYTIAL VIRUS-RVPPCR: NOT DETECTED
RHINOVIRUS / ENTEROVIRUS - RVPPCR: NOT DETECTED

## 2017-03-29 LAB — I-STAT ARTERIAL BLOOD GAS, ED
Acid-base deficit: 7 mmol/L — ABNORMAL HIGH (ref 0.0–2.0)
BICARBONATE: 19 mmol/L — AB (ref 20.0–28.0)
O2 Saturation: 100 %
PO2 ART: 241 mmHg — AB (ref 83.0–108.0)
Patient temperature: 98.9
TCO2: 20 mmol/L — ABNORMAL LOW (ref 22–32)
pCO2 arterial: 37.2 mmHg (ref 32.0–48.0)
pH, Arterial: 7.316 — ABNORMAL LOW (ref 7.350–7.450)

## 2017-03-29 LAB — I-STAT TROPONIN, ED: Troponin i, poc: 0.13 ng/mL (ref 0.00–0.08)

## 2017-03-29 LAB — BASIC METABOLIC PANEL
ANION GAP: 14 (ref 5–15)
BUN: 28 mg/dL — ABNORMAL HIGH (ref 6–20)
CO2: 20 mmol/L — AB (ref 22–32)
Calcium: 8.7 mg/dL — ABNORMAL LOW (ref 8.9–10.3)
Chloride: 104 mmol/L (ref 101–111)
Creatinine, Ser: 0.68 mg/dL (ref 0.44–1.00)
GFR calc Af Amer: >60 mL/min (ref >60)
GFR calc non Af Amer: >60 mL/min (ref >60)
GLUCOSE: 69 mg/dL (ref 65–99)
POTASSIUM: 4.9 mmol/L (ref 3.5–5.1)
Sodium: 138 mmol/L (ref 135–145)

## 2017-03-29 LAB — GLUCOSE, CAPILLARY
GLUCOSE-CAPILLARY: 140 mg/dL — AB (ref 65–99)
GLUCOSE-CAPILLARY: 203 mg/dL — AB (ref 65–99)
Glucose-Capillary: 191 mg/dL — ABNORMAL HIGH (ref 65–99)

## 2017-03-29 LAB — TROPONIN I
TROPONIN I: 0.17 ng/mL — AB (ref <0.03)
Troponin I: 0.15 ng/mL (ref <0.03)
Troponin I: 0.14 ng/mL (ref <0.03)

## 2017-03-29 LAB — INFLUENZA PANEL BY PCR (TYPE A & B)
Influenza A By PCR: NEGATIVE
Influenza B By PCR: NEGATIVE

## 2017-03-29 LAB — STREP PNEUMONIAE URINARY ANTIGEN: STREP PNEUMO URINARY ANTIGEN: NEGATIVE

## 2017-03-29 LAB — CBG MONITORING, ED: Glucose-Capillary: 86 mg/dL (ref 65–99)

## 2017-03-29 LAB — I-STAT CG4 LACTIC ACID, ED
LACTIC ACID, VENOUS: 5.49 mmol/L — AB (ref 0.5–1.9)
Lactic Acid, Venous: 4.5 mmol/L (ref 0.5–1.9)

## 2017-03-29 LAB — MRSA PCR SCREENING: MRSA BY PCR: POSITIVE — AB

## 2017-03-29 LAB — PROCALCITONIN: PROCALCITONIN: 0.54 ng/mL

## 2017-03-29 LAB — LACTIC ACID, PLASMA
Lactic Acid, Venous: 4.2 mmol/L (ref 0.5–1.9)
Lactic Acid, Venous: 5 mmol/L (ref 0.5–1.9)

## 2017-03-29 LAB — BRAIN NATRIURETIC PEPTIDE: B NATRIURETIC PEPTIDE 5: 126.2 pg/mL — AB (ref 0.0–100.0)

## 2017-03-29 MED ORDER — FENTANYL CITRATE (PF) 100 MCG/2ML IJ SOLN
INTRAMUSCULAR | Status: AC
  Filled 2017-03-29: qty 2

## 2017-03-29 MED ORDER — ARFORMOTEROL TARTRATE 15 MCG/2ML IN NEBU
15.0000 ug | INHALATION_SOLUTION | Freq: Two times a day (BID) | RESPIRATORY_TRACT | Status: DC
  Administered 2017-03-29 – 2017-03-31 (×4): 15 ug via RESPIRATORY_TRACT
  Filled 2017-03-29 (×6): qty 2

## 2017-03-29 MED ORDER — DEXMEDETOMIDINE HCL 200 MCG/2ML IV SOLN
0.0000 ug/kg/h | INTRAVENOUS | Status: DC
  Administered 2017-03-29: 0.5 ug/kg/h via INTRAVENOUS
  Filled 2017-03-29 (×2): qty 2

## 2017-03-29 MED ORDER — ORAL CARE MOUTH RINSE
15.0000 mL | Freq: Four times a day (QID) | OROMUCOSAL | Status: DC
  Administered 2017-03-29 – 2017-03-31 (×9): 15 mL via OROMUCOSAL

## 2017-03-29 MED ORDER — MIDAZOLAM HCL 2 MG/2ML IJ SOLN
INTRAMUSCULAR | Status: AC
  Filled 2017-03-29: qty 2

## 2017-03-29 MED ORDER — MIDAZOLAM HCL 2 MG/2ML IJ SOLN: 1.0000 mg | INTRAMUSCULAR | Status: DC | PRN

## 2017-03-29 MED ORDER — SODIUM CHLORIDE 0.9 % IV BOLUS (SEPSIS)
1000.0000 mL | Freq: Once | INTRAVENOUS | Status: AC
  Administered 2017-03-29: 1000 mL via INTRAVENOUS

## 2017-03-29 MED ORDER — FENTANYL CITRATE (PF) 100 MCG/2ML IJ SOLN
50.0000 ug | INTRAMUSCULAR | Status: AC | PRN
  Administered 2017-03-29 (×3): 50 ug via INTRAVENOUS

## 2017-03-29 MED ORDER — HEPARIN SODIUM (PORCINE) 5000 UNIT/ML IJ SOLN
5000.0000 [IU] | Freq: Three times a day (TID) | INTRAMUSCULAR | Status: DC
  Administered 2017-03-29 – 2017-03-31 (×7): 5000 [IU] via SUBCUTANEOUS
  Filled 2017-03-29 (×9): qty 1

## 2017-03-29 MED ORDER — FENTANYL CITRATE (PF) 100 MCG/2ML IJ SOLN
50.0000 ug | Freq: Once | INTRAMUSCULAR | Status: AC
  Administered 2017-03-29: 50 ug via INTRAVENOUS

## 2017-03-29 MED ORDER — NOREPINEPHRINE BITARTRATE 1 MG/ML IV SOLN
0.0000 ug/min | INTRAVENOUS | Status: DC
  Administered 2017-03-29: 15 ug/min via INTRAVENOUS
  Administered 2017-03-29: 18 ug/min via INTRAVENOUS
  Administered 2017-03-29: 17 ug/min via INTRAVENOUS
  Administered 2017-03-29: 2 ug/min via INTRAVENOUS
  Filled 2017-03-29 (×5): qty 4

## 2017-03-29 MED ORDER — DEXTROSE 5 % IV SOLN
1.0000 g | Freq: Three times a day (TID) | INTRAVENOUS | Status: DC
  Administered 2017-03-29 – 2017-03-31 (×7): 1 g via INTRAVENOUS
  Filled 2017-03-29 (×7): qty 1

## 2017-03-29 MED ORDER — INSULIN ASPART 100 UNIT/ML ~~LOC~~ SOLN
2.0000 [IU] | SUBCUTANEOUS | Status: DC
  Administered 2017-03-29: 2 [IU] via SUBCUTANEOUS
  Administered 2017-03-29: 6 [IU] via SUBCUTANEOUS
  Administered 2017-03-29: 4 [IU] via SUBCUTANEOUS
  Administered 2017-03-30: 2 [IU] via SUBCUTANEOUS
  Administered 2017-03-30: 4 [IU] via SUBCUTANEOUS
  Administered 2017-03-30: 6 [IU] via SUBCUTANEOUS
  Administered 2017-03-31 (×4): 4 [IU] via SUBCUTANEOUS

## 2017-03-29 MED ORDER — SODIUM CHLORIDE 0.9 % IV SOLN
INTRAVENOUS | Status: DC
  Administered 2017-03-29 – 2017-03-30 (×4): via INTRAVENOUS

## 2017-03-29 MED ORDER — MIDAZOLAM HCL 2 MG/2ML IJ SOLN
1.0000 mg | INTRAMUSCULAR | Status: DC | PRN
  Administered 2017-03-29 – 2017-03-30 (×2): 1 mg via INTRAVENOUS
  Filled 2017-03-29: qty 2

## 2017-03-29 MED ORDER — FENTANYL CITRATE (PF) 100 MCG/2ML IJ SOLN
50.0000 ug | INTRAMUSCULAR | Status: DC | PRN
  Administered 2017-03-29 (×2): 50 ug via INTRAVENOUS
  Administered 2017-03-30: 25 ug via INTRAVENOUS
  Administered 2017-03-30: 50 ug via INTRAVENOUS
  Filled 2017-03-29 (×4): qty 2

## 2017-03-29 MED ORDER — IPRATROPIUM-ALBUTEROL 0.5-2.5 (3) MG/3ML IN SOLN
3.0000 mL | Freq: Four times a day (QID) | RESPIRATORY_TRACT | Status: DC
  Administered 2017-03-29 – 2017-03-31 (×9): 3 mL via RESPIRATORY_TRACT
  Filled 2017-03-29 (×9): qty 3

## 2017-03-29 MED ORDER — CHLORHEXIDINE GLUCONATE 0.12% ORAL RINSE (MEDLINE KIT)
15.0000 mL | Freq: Two times a day (BID) | OROMUCOSAL | Status: DC
  Administered 2017-03-29 – 2017-03-31 (×5): 15 mL via OROMUCOSAL

## 2017-03-29 MED ORDER — SODIUM CHLORIDE 0.9 % IV BOLUS (SEPSIS)
1500.0000 mL | Freq: Once | INTRAVENOUS | Status: AC
  Administered 2017-03-29: 1500 mL via INTRAVENOUS

## 2017-03-29 MED ORDER — DEXTROSE 5 % IV SOLN
2.0000 g | Freq: Once | INTRAVENOUS | Status: AC
  Administered 2017-03-29: 2 g via INTRAVENOUS
  Filled 2017-03-29 (×2): qty 2

## 2017-03-29 MED ORDER — BUDESONIDE 0.25 MG/2ML IN SUSP
0.2500 mg | Freq: Two times a day (BID) | RESPIRATORY_TRACT | Status: DC
  Administered 2017-03-29 – 2017-03-31 (×4): 0.25 mg via RESPIRATORY_TRACT
  Filled 2017-03-29 (×6): qty 2

## 2017-03-29 MED ORDER — SODIUM CHLORIDE 0.9 % IV SOLN
250.0000 mL | INTRAVENOUS | Status: DC | PRN
  Administered 2017-03-29: 250 mL via INTRAVENOUS

## 2017-03-29 MED ORDER — MIDAZOLAM HCL 2 MG/2ML IJ SOLN
2.0000 mg | Freq: Once | INTRAMUSCULAR | Status: AC
  Administered 2017-03-29: 2 mg via INTRAVENOUS

## 2017-03-29 MED ORDER — ETOMIDATE 2 MG/ML IV SOLN
30.0000 mg | Freq: Once | INTRAVENOUS | Status: AC
  Administered 2017-03-29: 30 mg via INTRAVENOUS

## 2017-03-29 MED ORDER — MIDAZOLAM HCL 2 MG/2ML IJ SOLN
1.0000 mg | INTRAMUSCULAR | Status: DC | PRN
  Filled 2017-03-29: qty 2

## 2017-03-29 MED ORDER — VANCOMYCIN HCL 10 G IV SOLR
1500.0000 mg | Freq: Once | INTRAVENOUS | Status: AC
  Administered 2017-03-29: 1500 mg via INTRAVENOUS
  Filled 2017-03-29: qty 1500

## 2017-03-29 MED ORDER — VANCOMYCIN HCL IN DEXTROSE 1-5 GM/200ML-% IV SOLN
1000.0000 mg | Freq: Two times a day (BID) | INTRAVENOUS | Status: DC
  Administered 2017-03-29 – 2017-03-31 (×4): 1000 mg via INTRAVENOUS
  Filled 2017-03-29 (×6): qty 200

## 2017-03-29 MED ORDER — SODIUM CHLORIDE 0.9 % IV SOLN: 0.0000 ug/kg/h | INTRAVENOUS | Status: DC

## 2017-03-29 MED ORDER — GERHARDT'S BUTT CREAM
TOPICAL_CREAM | Freq: Three times a day (TID) | CUTANEOUS | Status: DC
  Administered 2017-03-30 (×3): 1 via TOPICAL
  Administered 2017-03-30 – 2017-03-31 (×2): via TOPICAL
  Filled 2017-03-29: qty 1

## 2017-03-29 MED ORDER — DEXMEDETOMIDINE HCL IN NACL 400 MCG/100ML IV SOLN
0.0000 ug/kg/h | INTRAVENOUS | Status: DC
  Administered 2017-03-29: 0.6 ug/kg/h via INTRAVENOUS
  Administered 2017-03-30: 1.2 ug/kg/h via INTRAVENOUS
  Filled 2017-03-29 (×5): qty 100

## 2017-03-29 MED ORDER — PANTOPRAZOLE SODIUM 40 MG IV SOLR
40.0000 mg | Freq: Every day | INTRAVENOUS | Status: DC
  Administered 2017-03-29 – 2017-03-30 (×2): 40 mg via INTRAVENOUS
  Filled 2017-03-29 (×3): qty 40
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
## 2017-03-29 NOTE — Procedures (Signed)
Intubation Procedure Note [facility] [id number] 26-Oct-1940  Procedure: Intubation Indications: Respiratory insufficiency  Procedure Details Consent: Unable to obtain consent because of emergent medical necessity. Time Out: Verified patient identification, verified procedure, site/side was marked, verified correct patient position, special equipment/implants available, medications/allergies/relevent history reviewed, required imaging and test results available.  Performed  Maximum sterile technique was used including gloves.  MAC    Evaluation Hemodynamic Status: Persistent hypotension treated with fluid; O2 sats: stable throughout Patient's Current Condition: unstable Complications: No apparent complications Patient did tolerate procedure well. Chest X-ray ordered to verify placement.  CXR: tube position acceptable.   Laurie Collins 03/20/2017

## 2017-03-29 NOTE — ED Notes (Signed)
Port chest

## 2017-03-29 NOTE — Progress Notes (Signed)
Initial Nutrition Assessment  DOCUMENTATION CODES:   Not applicable  INTERVENTION:    If TF started, rec initiating Vital AF 1.2 at goal rate of 50 ml/h (1080 ml per day) and Prostat 30 ml daily  Provides 1540 kcals, 105 gm protein, 973 ml of free water daily  NUTRITION DIAGNOSIS:   Inadequate oral intake related to inability to eat as evidenced by NPO status  GOAL:   Patient will meet greater than or equal to 90% of their needs  MONITOR:   Vent status, Labs, Weight trends, Skin, I & O's, TF initiation   REASON FOR ASSESSMENT:   Ventilator  ASSESSMENT:   76 yo Female with PMH of COPD, depression, DM, HTN, and metastatic breast cancer >> presented to ED from SNF on 12/15 with hypoxia and shortness of breath. WBC 8.5, LA 5.49, CXR with patchy right basilar and left sided airspace opacities. Failed BiPAP requiring intubation. Given Vancomycin and Azactam. PCCM asked to admit.   Patient is currently intubated on ventilator support MV: 11.7 L/min Temp (24hrs), Avg:98.9 F (37.2 C), Min:98.8 F (37.1 C), Max:98.9 F (37.2 C)  Pt arrived in significant respiratory distress and subsequently intubated. Workup revealed bilateral multifocal pneumonia.  CWOCN consulted for skin breakdown. Labs and medications reviewed. CBG's 86-140.  NUTRITION - FOCUSED PHYSICAL EXAM:    Most Recent Value  Orbital Region  Unable to assess  Upper Arm Region  Unable to assess  Thoracic and Lumbar Region  Unable to assess  Buccal Region  Unable to assess  Temple Region  Unable to assess  Clavicle Bone Region  Unable to assess  Clavicle and Acromion Bone Region  Unable to assess  Scapular Bone Region  Unable to assess  Dorsal Hand  Unable to assess  Patellar Region  Unable to assess  Anterior Thigh Region  Unable to assess  Posterior Calf Region  Unable to assess  Edema (RD Assessment)  Unable to assess    Diet Order:  Diet NPO time specified  EDUCATION NEEDS:   No education needs  have been identified at this time  Skin:  Skin Assessment: Skin Integrity Issues: Skin Integrity Issues:: Other (Comment) Other: full thickness MASD to bottom area/perineum   Last BM:  PTA  Height:   Ht Readings from Last 1 Encounters:  03/28/2017 5\' 5"  (1.651 m)   Weight:   Wt Readings from Last 1 Encounters:  04/08/2017 160 lb 11.5 oz (72.9 kg)   Ideal Body Weight:  56.8 kg  BMI:  Body mass index is 26.74 kg/m.  Estimated Nutritional Needs:   Kcal:  1540  Protein:  105-120 gm  Fluid:  per MD  Arthur Holms, RD, LDN Pager #: [phone] After-Hours Pager #: [phone]

## 2017-03-29 NOTE — Progress Notes (Signed)
Pt arrived on ICU. Restless, VS stable. Levo at 4, not other infusions are running. Precedex order is in and infusing.Levo titrated up.  Lactic acid of 5 was reported to P. Heber [place], MD. Skin assessment: Bottom area and perineum seems to be full thickness MASD/many open areas bilaterally. Cleaned. Foam placed . Foley inserted. Wound consulted.  Bed in low position, alarms are on, call bell in reach, continue to monitor.

## 2017-03-29 NOTE — Progress Notes (Signed)
Patient transported from ED to room 2Q58 without complications.
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
## 2017-03-29 NOTE — Procedures (Signed)
Central Venous Catheter Insertion Procedure Note [facility] [id number] Aug 13, 1940  Procedure: Insertion of Central Venous Catheter Indications: Drug and/or fluid administration  Procedure Details Consent: Unable to obtain consent because of emergent medical necessity. Time Out: Verified patient identification, verified procedure, site/side was marked, verified correct patient position, special equipment/implants available, medications/allergies/relevent history reviewed, required imaging and test results available.  Performed  Maximum sterile technique was used including gown. Skin prep: Chlorhexidine; local anesthetic administered A antimicrobial bonded/coated triple lumen catheter was placed in the left internal jugular vein using the Seldinger technique.  Evaluation Blood flow good Complications: No apparent complications Patient did tolerate procedure well. Chest X-ray ordered to verify placement.  CXR: normal.  Laurie Collins 04/11/2017, 7:30 AM

## 2017-03-29 NOTE — ED Triage Notes (Signed)
The pt arrived by [facility] from [place] for 2 hoursdjy nailbeds at nursing home  Hx copd breast cancer.  She arrived on cx-pap  Pt non-verbal  ronchi more on the lt than right all extremities swollen  No dnr  Son has been contacted

## 2017-03-29 NOTE — Progress Notes (Signed)
PCCM INTERVAL PROGRESS NOTE  Patient arrived from ED. Admitted by PCCM Dr. Marcello Moores this AM. Agitated with RASS 2 despite PRN fentanyl. Will add precedex infusion  Urinary incontinence with perineal skin breakdown. Will place foley order.  Georgann Housekeeper, AGACNP-BC [facility] Pulmonology/Critical Care Pager [phone] or [phone]  03/24/2017 9:19 AM

## 2017-03-29 NOTE — Progress Notes (Signed)
Patient family called back after discussing Code Status. Would not wish for mother to undergo CPR/Shock. Will update code status in EMR.   Laurie Collins, AGACNP-BC [facility]  Pgr: [phone]  PCCM Pgr: [phone]

## 2017-03-29 NOTE — Consult Note (Signed)
[place] Nurse wound consult note Reason for Consult: Full thickness, painful areas of skin loss in the intragluteal, labial, perineal areas with ruddy red base and necrotic slough in small amounts. Circular, punctate lesions surround primary affected area. Inconsistent with moisture associated skin damage and IAD, more consistent with immunocompromised patients, viral and infectious presentations. It is noted that patient had been on chemotherapy for her metastatic breast cancer. Wound type: Viral, infectious Pressure Injury POA: NA Measurement: 18cm x 14cm affected area with deepest depths in lesions measuring 0.4cm. Circular, punctate lesions superior to central lesions in the intragluteal cleft area measure 1cm x 1.2cm x 0.4cm.  Labial lesions measure 0.5cm round x 0.2cm. Intragluteal cleft lesions measure 10cm x 3cm x 0.4cm and are bilateral "kissing" wounds. Wound CHE:NIDPO red, moist,  Drainage (amount, consistency, odor) serous Periwound:erythematous, edematous Dressing procedure/placement/frequency: Patent is on a therapeutic sleep surface with low air loss feature here in the ICU, I have ordered that this therapy be continued with a therapeutic sleep surface with low air loss feature upon discharge to the floor. Bilateral heels are boggy and dry, I will today provide Pressure Redistribution heel boots.  I have given Nursing guidance via the Orders for turning and repositioning with a "leg over leg" position as tolerated to "open" the buttocks.  Topical care will be initiated using Dr. Bing Matter Cream, a 1:1:1 compounded preparation of antifungal (Lotrmin), barrier (Zince Oxide) and steroid (hydrocortisone).  Suggest systemic antiviral.  If you agree, please order. This presentation is more dermatological than the scope of [place] can address.  Consultation with ID if no improvement is recommended. [place] nursing team will not follow, but will remain available to this patient, the nursing and  medical teams.  Please re-consult if needed. Thanks, Maudie Flakes, MSN, RN, [place], Arther Abbott  Pager# [phone]

## 2017-03-29 NOTE — ED Notes (Signed)
Report given to [facility] on 88M. Waiting for respiratory before transportation to Unit.

## 2017-03-29 NOTE — ED Notes (Signed)
CCm aged due to patient frequently becoming agitated  and lifting hands toward ET tube even have 150 mcg Fent. and 1 mg versed IV push.

## 2017-03-29 NOTE — ED Notes (Signed)
Fentanyl 79mcgiv per dr Marcello Moores order

## 2017-03-29 NOTE — ED Notes (Signed)
Blood draw by MD Marcello Moores

## 2017-03-29 NOTE — Progress Notes (Signed)
ANTIBIOTIC CONSULT NOTE - INITIAL  Pharmacy Consult for Vanco/Aztreonam Indication: HCAP  Allergies  Allergen Reactions  . Doxycycline Anaphylaxis  . Penicillins Hives and Swelling    Has patient had a PCN reaction causing immediate rash, facial/tongue/throat swelling, SOB or lightheadedness with hypotension: Yes Has patient had a PCN reaction causing severe rash involving mucus membranes or skin necrosis: No Has patient had a PCN reaction that required hospitalization: No Has patient had a PCN reaction occurring within the last 10 years: No If all of the above answers are "NO", then may proceed with Cephalosporin use.     Patient Measurements: Height: 5\' 5"  (165.1 cm) Weight: 171 lb (77.6 kg) IBW/kg (Calculated) : 57 Adjusted Body Weight:    Vital Signs: Temp: 98.9 F (37.2 C) (12/15 0411) BP: 86/56 (12/15 0800) Pulse Rate: 85 (12/15 0800) Intake/Output from previous day: No intake/output data recorded. Intake/Output from this shift: Total I/O In: 1000 [I.V.:1000] Out: -   Labs: Recent Labs    04/06/2017 0423  WBC 8.5  HGB 11.2*  PLT 213  CREATININE 0.68   Estimated Creatinine Clearance: 61.6 mL/min (by C-G formula based on SCr of 0.68 mg/dL). No results for input(s): VANCOTROUGH, VANCOPEAK, VANCORANDOM, GENTTROUGH, GENTPEAK, GENTRANDOM, TOBRATROUGH, TOBRAPEAK, TOBRARND, AMIKACINPEAK, AMIKACINTROU, AMIKACIN in the last 72 hours.   Microbiology: No results found for this or any previous visit (from the past 720 hour(s)).  Medical History: Past Medical History:  Diagnosis Date  . Anxiety   . Cancer ([place])    breast  . COPD (chronic obstructive pulmonary disease) ([place])   . Depression   . Diabetes mellitus without complication ([place])   . Dyspnea   . Hypertension   . PONV (postoperative nausea and vomiting)    very,very,very sick from anesthesia  . Tuberculosis    1962    Assessment: CC/HPI: dyspnea and hypoxia. Intubated for acute hypoxic respiratory  failure and HCAP.  - Afebrile. WBC 8.5. +troponin, LA 5.49>4.5  PMH: COPD, DM, HTN, metastatic breast cancer, anxiety, depression, DM, dypnes, HTN, TB in 1962   ID: HCAP, septic shock. ABX given x 1 in ED. Had to page Dr. Halford Chessman for additional abx orders and Rx consults. Scr WNL  12/15: Vanco>> 12/15: Aztreonam>>   Goal of Therapy:  Vancomycin trough level 15-20 mcg/ml  Plan:  Vanco 1500mg  IV x 1 in ED then 1g IV q 12hrs.  Vanc trough after 3-5 doses at steady stat Aztreonam 1g IV q 8 hrs   Laurie Collins Laurie Collins, PharmD, BCPS Clinical Staff Pharmacist Pager [phone]  Laurie Collins 03/23/2017,8:11 AM

## 2017-03-29 NOTE — ED Notes (Signed)
MD Marcello Moores putting in [employer] and will draw first set of blood cultures and addl labs.

## 2017-03-30 ENCOUNTER — Inpatient Hospital Stay (HOSPITAL_COMMUNITY): Payer: Medicare HMO

## 2017-03-30 DIAGNOSIS — J9601 Acute respiratory failure with hypoxia: Secondary | ICD-10-CM

## 2017-03-30 LAB — BASIC METABOLIC PANEL
ANION GAP: 10 (ref 5–15)
Anion gap: 11 (ref 5–15)
BUN: 24 mg/dL — ABNORMAL HIGH (ref 6–20)
BUN: 25 mg/dL — ABNORMAL HIGH (ref 6–20)
CALCIUM: 7.5 mg/dL — AB (ref 8.9–10.3)
CALCIUM: 7 mg/dL — AB (ref 8.9–10.3)
CO2: 16 mmol/L — ABNORMAL LOW (ref 22–32)
CO2: 13 mmol/L — ABNORMAL LOW (ref 22–32)
CREATININE: 0.81 mg/dL (ref 0.44–1.00)
CREATININE: 0.89 mg/dL (ref 0.44–1.00)
Chloride: 110 mmol/L (ref 101–111)
Chloride: 112 mmol/L — ABNORMAL HIGH (ref 101–111)
GFR calc Af Amer: >60 mL/min (ref >60)
GLUCOSE: 149 mg/dL — AB (ref 65–99)
Glucose, Bld: 80 mg/dL (ref 65–99)
Potassium: 3.3 mmol/L — ABNORMAL LOW (ref 3.5–5.1)
Potassium: 3 mmol/L — ABNORMAL LOW (ref 3.5–5.1)
SODIUM: 137 mmol/L (ref 135–145)
Sodium: 135 mmol/L (ref 135–145)

## 2017-03-30 LAB — POCT I-STAT 3, ART BLOOD GAS (G3+)
ACID-BASE DEFICIT: 16 mmol/L — AB (ref 0.0–2.0)
Acid-base deficit: 12 mmol/L — ABNORMAL HIGH (ref 0.0–2.0)
Bicarbonate: 13.4 mmol/L — ABNORMAL LOW (ref 20.0–28.0)
Bicarbonate: 11 mmol/L — ABNORMAL LOW (ref 20.0–28.0)
O2 SAT: 91 %
O2 SAT: 91 %
PCO2 ART: 27.7 mmHg — AB (ref 32.0–48.0)
PH ART: 7.292 — AB (ref 7.350–7.450)
PH ART: 7.207 — AB (ref 7.350–7.450)
Patient temperature: 98.1
Patient temperature: 97.3
TCO2: 14 mmol/L — ABNORMAL LOW (ref 22–32)
TCO2: 12 mmol/L — AB (ref 22–32)
pCO2 arterial: 27.3 mmHg — ABNORMAL LOW (ref 32.0–48.0)
pO2, Arterial: 66 mmHg — ABNORMAL LOW (ref 83.0–108.0)
pO2, Arterial: 69 mmHg — ABNORMAL LOW (ref 83.0–108.0)

## 2017-03-30 LAB — GLUCOSE, CAPILLARY
GLUCOSE-CAPILLARY: 234 mg/dL — AB (ref 65–99)
Glucose-Capillary: 123 mg/dL — ABNORMAL HIGH (ref 65–99)
Glucose-Capillary: 149 mg/dL — ABNORMAL HIGH (ref 65–99)
Glucose-Capillary: 154 mg/dL — ABNORMAL HIGH (ref 65–99)
Glucose-Capillary: 188 mg/dL — ABNORMAL HIGH (ref 65–99)
Glucose-Capillary: 53 mg/dL — ABNORMAL LOW (ref 65–99)
Glucose-Capillary: 61 mg/dL — ABNORMAL LOW (ref 65–99)
Glucose-Capillary: 66 mg/dL (ref 65–99)
Glucose-Capillary: 75 mg/dL (ref 65–99)

## 2017-03-30 LAB — CBC
HCT: 27.7 % — ABNORMAL LOW (ref 36.0–46.0)
Hemoglobin: 8.6 g/dL — ABNORMAL LOW (ref 12.0–15.0)
MCH: 26.8 pg (ref 26.0–34.0)
MCHC: 31 g/dL (ref 30.0–36.0)
MCV: 86.3 fL (ref 78.0–100.0)
PLATELETS: 139 10*3/uL — AB (ref 150–400)
RBC: 3.21 MIL/uL — AB (ref 3.87–5.11)
RDW: 21.3 % — ABNORMAL HIGH (ref 11.5–15.5)
WBC: 3.3 10*3/uL — AB (ref 4.0–10.5)

## 2017-03-30 LAB — BLOOD GAS, ARTERIAL
ACID-BASE DEFICIT: 13.1 mmol/L — AB (ref 0.0–2.0)
BICARBONATE: 13.1 mmol/L — AB (ref 20.0–28.0)
Drawn by: 24513
FIO2: 100
MECHVT: 550 mL
O2 SAT: 91.7 %
PATIENT TEMPERATURE: 97.2
PCO2 ART: 31.6 mmHg — AB (ref 32.0–48.0)
PEEP/CPAP: 10 cmH2O
PH ART: 7.234 — AB (ref 7.350–7.450)
RATE: 30 resp/min
pO2, Arterial: 71.9 mmHg — ABNORMAL LOW (ref 83.0–108.0)

## 2017-03-30 LAB — PROCALCITONIN: PROCALCITONIN: 9.68 ng/mL

## 2017-03-30 LAB — MAGNESIUM: MAGNESIUM: 1.3 mg/dL — AB (ref 1.7–2.4)

## 2017-03-30 LAB — LACTIC ACID, PLASMA
LACTIC ACID, VENOUS: 5.1 mmol/L — AB (ref 0.5–1.9)
LACTIC ACID, VENOUS: 6.9 mmol/L — AB (ref 0.5–1.9)

## 2017-03-30 LAB — PHOSPHORUS: PHOSPHORUS: 3.8 mg/dL (ref 2.5–4.6)

## 2017-03-30 LAB — LEGIONELLA PNEUMOPHILA SEROGP 1 UR AG: L. PNEUMOPHILA SEROGP 1 UR AG: NEGATIVE

## 2017-03-30 LAB — CORTISOL: Cortisol, Plasma: >100 ug/dL

## 2017-03-30 MED ORDER — DEXTROSE 50 % IV SOLN
INTRAVENOUS | Status: AC
  Filled 2017-03-30: qty 50

## 2017-03-30 MED ORDER — SODIUM BICARBONATE 8.4 % IV SOLN
100.0000 meq | Freq: Once | INTRAVENOUS | Status: AC
  Administered 2017-03-30: 100 meq via INTRAVENOUS
  Filled 2017-03-30: qty 100

## 2017-03-30 MED ORDER — SODIUM BICARBONATE 8.4 % IV SOLN
INTRAVENOUS | Status: DC
  Administered 2017-03-30 – 2017-03-31 (×3): via INTRAVENOUS
  Filled 2017-03-30 (×5): qty 150

## 2017-03-30 MED ORDER — DEXTROSE 50 % IV SOLN
1.0000 | Freq: Once | INTRAVENOUS | Status: AC
  Administered 2017-03-30: 50 mL via INTRAVENOUS
  Filled 2017-03-30: qty 50

## 2017-03-30 MED ORDER — POTASSIUM CHLORIDE 20 MEQ/15ML (10%) PO SOLN
40.0000 meq | Freq: Once | ORAL | Status: AC
  Administered 2017-03-30: 40 meq
  Filled 2017-03-30: qty 30

## 2017-03-30 MED ORDER — SODIUM CHLORIDE 0.9 % IV BOLUS (SEPSIS)
1000.0000 mL | Freq: Once | INTRAVENOUS | Status: AC
  Administered 2017-03-30: 1000 mL via INTRAVENOUS

## 2017-03-30 MED ORDER — DEXMEDETOMIDINE HCL IN NACL 400 MCG/100ML IV SOLN
0.0000 ug/kg/h | INTRAVENOUS | Status: DC
Start: 2017-03-30 — End: 2017-03-31
  Administered 2017-03-30 – 2017-03-31 (×3): 1.2 ug/kg/h via INTRAVENOUS
  Filled 2017-03-30 (×3): qty 100

## 2017-03-30 MED ORDER — HYDROCORTISONE NA SUCCINATE PF 100 MG IJ SOLR
50.0000 mg | Freq: Four times a day (QID) | INTRAMUSCULAR | Status: DC
  Administered 2017-03-30 – 2017-03-31 (×4): 50 mg via INTRAVENOUS
  Filled 2017-03-30: qty 1
  Filled 2017-03-30: qty 2
  Filled 2017-03-30: qty 1
  Filled 2017-03-30: qty 2
  Filled 2017-03-30 (×2): qty 1

## 2017-03-30 MED ORDER — SODIUM CHLORIDE 0.9 % IV BOLUS (SEPSIS)
500.0000 mL | Freq: Once | INTRAVENOUS | Status: AC
  Administered 2017-03-30: 500 mL via INTRAVENOUS

## 2017-03-30 MED ORDER — SODIUM BICARBONATE 8.4 % IV SOLN
INTRAVENOUS | Status: AC
  Administered 2017-03-30: 100 meq via INTRAVENOUS
  Filled 2017-03-30: qty 100

## 2017-03-30 MED ORDER — MUPIROCIN 2 % EX OINT
1.0000 "application " | TOPICAL_OINTMENT | Freq: Two times a day (BID) | CUTANEOUS | Status: DC
  Administered 2017-03-30 – 2017-03-31 (×4): 1 via NASAL
  Filled 2017-03-30: qty 22

## 2017-03-30 MED ORDER — DEXTROSE 5 % IV SOLN
0.0000 ug/min | INTRAVENOUS | Status: DC
  Administered 2017-03-30: 38 ug/min via INTRAVENOUS
  Administered 2017-03-30: 30 ug/min via INTRAVENOUS
  Administered 2017-03-30: 34 ug/min via INTRAVENOUS
  Administered 2017-03-31: 70 ug/min via INTRAVENOUS
  Administered 2017-03-31: 58 ug/min via INTRAVENOUS
  Administered 2017-03-31: 54 ug/min via INTRAVENOUS
  Filled 2017-03-30 (×7): qty 16

## 2017-03-30 MED ORDER — CHLORHEXIDINE GLUCONATE CLOTH 2 % EX PADS
6.0000 | MEDICATED_PAD | Freq: Every day | CUTANEOUS | Status: DC
  Administered 2017-03-30 – 2017-03-31 (×2): 6 via TOPICAL

## 2017-03-30 MED ORDER — VASOPRESSIN 20 UNIT/ML IV SOLN
0.0300 [IU]/min | INTRAVENOUS | Status: DC
  Administered 2017-03-30 – 2017-03-31 (×2): 0.03 [IU]/min via INTRAVENOUS
  Filled 2017-03-30 (×2): qty 2

## 2017-03-30 NOTE — Progress Notes (Signed)
RT attempted x2 to place art line opn pt's left radial without success.  RT informed RN.  RT will continue to monitor.

## 2017-03-30 NOTE — Progress Notes (Signed)
[facility] Progress Note Patient Name: Laurie Collins DOB: [birth year] MRN: [id number]   Date of Service  03/30/2017  HPI/Events of Note  ABG on 100%/PRVC 16/TV 550/P 8 = 7.20/27/72/11. Patient is currently breathing over the set rate at 32.   eICU Interventions  Will order: 1. Increase PRVC rate to 30 and increase PEEP to 10.  2. Increase NaHCO3 IV infusion to 125 mL/hour.  3. NaHCO3 100 meq IV now.  4. Repeat ABG at 12 midnight.  5. Increase ceiling on Norepinephrine IV infusion to 70 mcg/min.  6. Continue to monitor CVP as ordered.      Intervention Category Major Interventions: Hypotension - evaluation and management;Acid-Base disturbance - evaluation and management  Sommer,Steven Eugene 03/30/2017, 9:25 PM

## 2017-03-30 NOTE — Procedures (Signed)
Arterial Catheter Insertion Procedure Note Laurie Collins [id number] 1940-07-31  Procedure: Insertion of Arterial Catheter  Indications: Blood pressure monitoring and Frequent blood sampling  Procedure Details Consent: Unable to obtain consent because of emergent medical necessity. Time Out: Verified patient identification, verified procedure, site/side was marked, verified correct patient position, special equipment/implants available, medications/allergies/relevent history reviewed, required imaging and test results available.  Performed  Maximum sterile technique was used including antiseptics, cap, gloves, gown, hand hygiene, mask and sheet. Skin prep: Chlorhexidine; local anesthetic administered 20 gauge catheter was inserted into left radial artery using the Seldinger technique.  Evaluation Blood flow good; BP tracing good. Complications: No apparent complications.   Dimple Nanas 03/30/2017

## 2017-03-30 NOTE — Progress Notes (Signed)
PULMONARY / CRITICAL CARE MEDICINE   Name: Laurie Collins MRN: [id number] DOB: 09-19-1940    ADMISSION DATE:  03/28/2017 CONSULTATION DATE:  03/28/2017  REFERRING MD:  Dr. Stark Jock   CHIEF COMPLAINT:  Hypoxia   HISTORY OF PRESENT ILLNESS:   76 year old female with PMH of COPD, Depression, DM, HTN, and Metastatic Breast Cancer > Completed 12 weeks of Taxol, 3 cycles of Adriamycin and Cytoxan, Oncology on 11/06 noted need for surgery consultation for bilateral breast surgery   Presented to ED from SNF on 12/15 with hypoxia and shortness of breath. WBC 8.5, LA 5.49, CXR with patchy right basilar and left sided airspace opacities. Failed BiPAP requiring intubation. Given Vancomycin and Azactam. PCCM asked to admit.      SUBJECTIVE:  Ongoing hypotension overnight. CVP 1-3  VITAL SIGNS: BP (!) 88/59 (BP Location: Right Leg)   Pulse (!) 108   Temp 98 F (36.7 C) (Oral)   Resp (!) 24   Ht 5\' 5"  (1.651 m)   Wt 78.6 kg (173 lb 4.5 oz)   SpO2 100%   BMI 28.84 kg/m   HEMODYNAMICS: CVP:  [3 mmHg-13 mmHg] 7 mmHg  VENTILATOR SETTINGS: Vent Mode: PRVC FiO2 (%):  [50 %-90 %] 90 % Set Rate:  [16 bmp] 16 bmp Vt Set:  [550 mL] 550 mL PEEP:  [5 cmH20-8 cmH20] 8 cmH20 Plateau Pressure:  [24 cmH20-32 cmH20] 32 cmH20  INTAKE / OUTPUT: I/O last 3 completed shifts: In: 4297.5 [I.V.:3747.5; IV Piggyback:550] Out: 1865 [Urine:1865]  PHYSICAL EXAMINATION: General:  Adult female, no acute distress on the vent, sedated  Neuro: Sedated, RASS -2 HEENT:  Dry MM, ETT Cardiovascular:  RRR, no MRG  Lungs: Respirations even nonlabored on vent, coarse breath sounds, no crackles/wheeze  Abdomen:  Obese, active bowel sounds  Musculoskeletal:  +2 edema BLE  Skin:  Warm, dry   LABS:  BMET Recent Labs  Lab 03/28/2017 0423 03/30/17 0407  NA 138 137  K 4.9 3.3*  CL 104 110  CO2 20* 16*  BUN 28* 24*  CREATININE 0.68 0.81  GLUCOSE 69 80    Electrolytes Recent Labs  Lab 04/03/2017 0423  03/30/17 0407  CALCIUM 8.7* 7.5*  MG  --  1.3*  PHOS  --  3.8    CBC Recent Labs  Lab 03/20/2017 0423 03/30/17 0407  WBC 8.5 3.3*  HGB 11.2* 8.6*  HCT 34.9* 27.7*  PLT 213 139*    Coag's No results for input(s): APTT, INR in the last 168 hours.  Sepsis Markers Recent Labs  Lab 04/13/2017 0648 04/09/2017 0714 04/09/2017 0834 03/30/17 0407  LATICACIDVEN 4.2* 4.50* 5.0*  --   PROCALCITON 0.54  --   --  9.68    ABG Recent Labs  Lab 04/08/2017 0703 03/30/17 0914  PHART 7.316* 7.292*  PCO2ART 37.2 27.7*  PO2ART 241.0* 66.0*    Liver Enzymes No results for input(s): AST, ALT, ALKPHOS, BILITOT, ALBUMIN in the last 168 hours.  Cardiac Enzymes Recent Labs  Lab 04/10/2017 0648 03/24/2017 1137 03/30/2017 1636  TROPONINI 0.15* 0.14* 0.17*    Glucose Recent Labs  Lab 03/22/2017 2025 03/30/17 0021 03/30/17 0426 03/30/17 0432 03/30/17 0839 03/30/17 0843  GLUCAP 203* 154* 66 75 53* 61*    Imaging Dg Chest Port 1 View  Result Date: 03/30/2017 CLINICAL DATA:  76 year old female with acute shortness of breath. Intubated. EXAM: PORTABLE CHEST 1 VIEW COMPARISON:  03/23/2017 and earlier. FINDINGS: Portable AP semi upright view at 0657 hours. Endotracheal tube  tip between the level the clavicles and carina. Enteric tube courses to the abdomen, side hole up the level of the gastric body. Stable left IJ approach central line. Right chest porta cath remains in place and is now accessed. Progressive bilateral Patchy and confluent pulmonary opacity, maximal in the right lung at the base and throughout the left mid and lower lung. No superimposed pneumothorax. No definite pleural effusion. Stable cardiac size and mediastinal contours. No pulmonary edema. IMPRESSION: 1.  Stable lines and tubes. 2. Worsening bilateral Patchy and confluent pulmonary opacity. Favor progressive bilateral pneumonia. Asymmetric pulmonary edema and other differential considerations are less likely. No pleural effusion  is evident. Electronically Signed   By: Genevie Ann M.D.   On: 03/30/2017 09:14     STUDIES:  CXR 12/15 > Patchy right basilar and left-sided airspace opacities raise concern for multifocal pneumonia  CULTURES: Blood 12/15 >> Sputum 12/15 >> U/A 12/15 >>  RVP 12/15>>> neg   ANTIBIOTICS: Azactam 12/15  Vancomycin 12/15   SIGNIFICANT EVENTS: 12/15 > Presents to ED   LINES/TUBES: ETT 12/15 >>  L IJ CVL 12/15>>>   DISCUSSION: 76 year old female with metastatic breast cancer presents to ED with CAP and septic shock requiring intubation, pressors   ASSESSMENT / PLAN:  PULMONARY A: Ventilator dependent respiratory failure secondary to Acute Hypoxic Respiratory failure  in setting of HCAP -Patchy right basilar and left sided airspace opacities  H/O COPD  ARDS  P:   Vent support - 8cc/kg  F/u CXR  F/u ABG Pulmicort/Duoneb/[place]rovana   CARDIOVASCULAR A:  Septic shock secondary to HCAP  H/O HTN  Elevated troponin secondary to demand ischemia  Troponin 0.13 >  CVP 1-3 P:  Cardiac Monitoring  Wean Levophed to Maintain MAP >65  1L NS Now  Trend Troponin  Trend CVP  Hold Norvasc and Metoprolol in setting of hypotension  Trend lactate  Add vasopressin   RENAL A:   Severe Lactic Acidosis  LA 5.49 >  P:   Trend BMP Replace electrolytes as indicated  Trend LA - not yet clearing  Foley Monitor I/O Add HCO3 gtt for now  Repeat chem   GASTROINTESTINAL A:   SUP  P:   NPO  PPI   HEMATOLOGIC A:   Metastatic Breast Cancer  P:  Trend CBC   INFECTIOUS A:   HCAP +/-Viral Source  P:   Trend WBC and Fever Curve  Trend PCT and LA  Follow Culture Data  RVP and Flu negative  Continue Azactam/Vancomycin given penicillin allergy   ENDOCRINE A:   DM    P:   Trend Glucose  SSI   NEUROLOGIC A:   Metabolic Encephalopathy secondary to septic shock  P:   RASS goal: 0/-1 Monitor  PRN Fentanyl and Versed to achieve RASS    FAMILY  - Updates: no family at  bedside 12/16 -- need to further discuss goals of care and consider transition to comfort given persistent shock, lactate not celarning, worsening metabolic acidosis.  They are to be here this am per RN, will call if they do not arrive shortly.      Son Laurie Collins [phone] Laurie Collins [phone]  - Inter-disciplinary family meet or Palliative Care meeting due by: 04/05/2017    Nickolas Madrid, NP 03/30/2017  9:38 AM Pager: [phone] or [phone]

## 2017-03-30 NOTE — Progress Notes (Signed)
[facility] Progress Note Patient Name: Laurie Collins DOB: [birth year] MRN: [id number]   Date of Service  03/30/2017  HPI/Events of Note  Hypotension - BP = 84/53 by A-line. Already on Norepinephrine IV infusion at ceiling dose and a Vasopressin IV infusion.   eICU Interventions  Will order: 1. ABG STAT.      Intervention Category Major Interventions: Hypotension - evaluation and management  Ashleigh Arya Eugene 03/30/2017, 9:01 PM

## 2017-03-30 NOTE — Progress Notes (Signed)
CRITICAL VALUE ALERT  Critical Value:  Lactic Acid 6.9  Date & Time Notied:  03/30/17 at 1651  Provider Notified: Dr. Halford Chessman via telephone  Orders Received/Actions taken: No new verbal orders

## 2017-03-30 NOTE — Progress Notes (Signed)
CRITICAL VALUE ALERT  Critical Value:  Lactic Acid 5.1  Date & Time Notied:  03/30/17 at 75  Provider Notified: Dr. Halford Chessman  Orders Received/Actions taken: No new verbal orders

## 2017-03-31 ENCOUNTER — Inpatient Hospital Stay (HOSPITAL_COMMUNITY): Payer: Medicare HMO

## 2017-03-31 LAB — POCT I-STAT 3, ART BLOOD GAS (G3+)
Acid-base deficit: 15 mmol/L — ABNORMAL HIGH (ref 0.0–2.0)
Bicarbonate: 12 mmol/L — ABNORMAL LOW (ref 20.0–28.0)
O2 Saturation: 92 %
PCO2 ART: 29.6 mmHg — AB (ref 32.0–48.0)
PH ART: 7.214 — AB (ref 7.350–7.450)
TCO2: 13 mmol/L — ABNORMAL LOW (ref 22–32)
pO2, Arterial: 77 mmHg — ABNORMAL LOW (ref 83.0–108.0)

## 2017-03-31 LAB — BASIC METABOLIC PANEL
ANION GAP: 24 — AB (ref 5–15)
BUN: 24 mg/dL — ABNORMAL HIGH (ref 6–20)
CALCIUM: 6.8 mg/dL — AB (ref 8.9–10.3)
CO2: 12 mmol/L — ABNORMAL LOW (ref 22–32)
Chloride: 100 mmol/L — ABNORMAL LOW (ref 101–111)
Creatinine, Ser: 0.87 mg/dL (ref 0.44–1.00)
Glucose, Bld: 157 mg/dL — ABNORMAL HIGH (ref 65–99)
Potassium: 4.1 mmol/L (ref 3.5–5.1)
SODIUM: 136 mmol/L (ref 135–145)

## 2017-03-31 LAB — GLUCOSE, CAPILLARY
GLUCOSE-CAPILLARY: 157 mg/dL — AB (ref 65–99)
GLUCOSE-CAPILLARY: 185 mg/dL — AB (ref 65–99)
Glucose-Capillary: 163 mg/dL — ABNORMAL HIGH (ref 65–99)

## 2017-03-31 LAB — PROCALCITONIN: PROCALCITONIN: 19.69 ng/mL

## 2017-03-31 MED ORDER — SODIUM CHLORIDE 0.9 % IV SOLN
0.0000 ug/min | INTRAVENOUS | Status: DC
  Filled 2017-03-31: qty 4

## 2017-03-31 MED ORDER — MORPHINE BOLUS VIA INFUSION
5.0000 mg | INTRAVENOUS | Status: DC | PRN
  Filled 2017-03-31: qty 20

## 2017-03-31 MED ORDER — SODIUM CHLORIDE 0.9 % IV SOLN
10.0000 mg/h | INTRAVENOUS | Status: DC
  Administered 2017-03-31: 10 mg/h via INTRAVENOUS
  Filled 2017-03-31: qty 10

## 2017-03-31 MED ORDER — SODIUM CHLORIDE 0.9 % IV SOLN
0.0000 ug/min | INTRAVENOUS | Status: DC
  Administered 2017-03-31: 20 ug/min via INTRAVENOUS
  Filled 2017-03-31: qty 1
  Filled 2017-03-31: qty 10

## 2017-04-02 LAB — CULTURE, RESPIRATORY

## 2017-04-02 LAB — CULTURE, RESPIRATORY W GRAM STAIN

## 2017-04-03 LAB — CULTURE, BLOOD (ROUTINE X 2)
CULTURE: NO GROWTH
CULTURE: NO GROWTH
Special Requests: ADEQUATE

## 2017-04-04 ENCOUNTER — Other Ambulatory Visit: Payer: Self-pay | Admitting: Nurse Practitioner

## 2017-04-09 ENCOUNTER — Telehealth: Payer: Self-pay

## 2017-04-09 NOTE — Telephone Encounter (Signed)
On 04/09/17 I received a d/c from [employer] (original). The d/c is for cremation. The patient is a patient of [facility]. The d/c will be taken to [facility] for signature.  On 04/10/17 I received a d/c back from Doctor [facility]. I got the d/c ready and called the funeral home to let them know the d/c is ready for pickup. I also faxed a copy to the funeral home per the funeral home request.

## 2017-04-15 NOTE — Progress Notes (Signed)
Pt's son, Marcello Moores, has talked with his other brother, Abe People, and they have elected to withdrawal care.

## 2017-04-15 NOTE — Discharge Summary (Signed)
Physician Discharge Summary  Patient ID: LETA BUCKLIN MRN: [id number] DOB/AGE: November 06, 1940 77 y.o.  Admit date: 03/24/2017 Discharge date: 04/24/17  Admission Diagnoses: Acute resp failure with hypoxia  Discharge Diagnoses:  Active Problems:   Acute respiratory failure with hypoxia [facility]) Hospital acquired pneumonia ARDS Septic shock secondary to HCAP  Elevated troponin secondary to demand ischemia  Severe Lactic Acidosis  Metabolic Encephalopathy secondary to septic shock   Discharged Condition: Deceased  Hospital Course:  77 year old female with PMH of COPD, Depression, DM, HTN, and Metastatic Breast Cancer > Completed 12 weeks of Taxol, 3 cycles of Adriamycin and Cytoxan, Oncology on 11/06 noted need for surgery consultation for bilateral breast surgery   Presented to ED from SNF on 12/15 with hypoxia and shortness of breath. WBC 8.5, LA 5.49, CXR with patchy right basilar and left sided airspace opacities. Failed BiPAP requiring intubation. Given Vancomycin and Azactam and admitted to ICU.  On 04/01/17 she deteriorated with worsening shock, elevated lactic acid.  She required multiple pressors.  Meeting was held with patient's son Marcello Moores.  Son decided to transition to comfort care.  She was terminally extubated on 12/17.  Disposition: 20-Expired   Allergies as of 04/24/17      Reactions   Doxycycline Anaphylaxis   Penicillins Hives, Swelling   Has patient had a PCN reaction causing immediate rash, facial/tongue/throat swelling, SOB or lightheadedness with hypotension: Yes Has patient had a PCN reaction causing severe rash involving mucus membranes or skin necrosis: No Has patient had a PCN reaction that required hospitalization: No Has patient had a PCN reaction occurring within the last 10 years: No If all of the above answers are "NO", then may proceed with Cephalosporin use.      Medication List    ASK your doctor about these medications   acetaminophen  650 MG CR tablet Commonly known as:  TYLENOL Take 650 mg by mouth every 4 (four) hours as needed for pain.   albuterol 108 (90 Base) MCG/ACT inhaler Commonly known as:  PROVENTIL HFA;VENTOLIN HFA Inhale 2 puffs into the lungs every 6 (six) hours as needed for wheezing or shortness of breath.   amLODipine 10 MG tablet Commonly known as:  NORVASC Take 10 mg by mouth daily.   aspirin 81 MG chewable tablet Chew 81 mg by mouth daily.   collagenase ointment Commonly known as:  SANTYL Apply 1 application topically daily. Apply to the Buttocks   dexamethasone 4 MG tablet Commonly known as:  DECADRON Take 2 tablets (8 mg total) by mouth daily.   glimepiride 1 MG tablet Commonly known as:  AMARYL Take 1 mg by mouth daily with breakfast.   LEVEMIR FLEXTOUCH 100 UNIT/ML Pen Generic drug:  Insulin Detemir Inject 20 Units into the skin daily.   lisinopril 20 MG tablet Commonly known as:  PRINIVIL,ZESTRIL Take 20 mg by mouth daily.   loperamide 2 MG capsule Commonly known as:  IMODIUM Take 2 mg by mouth every 4 (four) hours as needed for diarrhea or loose stools.   magic mouthwash Soln Take 5 mLs by mouth 3 (three) times daily as needed for mouth pain.   metFORMIN 1000 MG tablet Commonly known as:  GLUCOPHAGE Take 1,000 mg by mouth 2 (two) times daily with a meal.   metoprolol succinate 100 MG 24 hr tablet Commonly known as:  TOPROL-XL Take 100 mg by mouth daily.   NOVOLOG 100 UNIT/ML injection Generic drug:  insulin aspart Inject 5 Units into the skin 3 (  three) times daily before meals.   oxyCODONE-acetaminophen 5-325 MG tablet Commonly known as:  PERCOCET/ROXICET Take 1 tablet by mouth every 6 (six) hours as needed for severe pain.   simvastatin 10 MG tablet Commonly known as:  ZOCOR Take 10 mg by mouth at bedtime.   SulfADIAZINE Powd Apply 1 application topically 2 (two) times daily. Apply to the buttocks   tiotropium 18 MCG inhalation capsule Commonly known as:   SPIRIVA HANDIHALER Place 1 capsule (18 mcg total) into inhaler and inhale daily.   triamcinolone 0.1 % cream : eucerin Crea Apply 1 application topically 2 (two) times daily as needed.   Vitamin D 2000 units tablet Take 2,000 Units by mouth daily.       Signed: Briah Nary 04/01/2017, 3:51 PM

## 2017-04-15 NOTE — Plan of Care (Signed)
  Clinical Measurements: Ability to maintain clinical measurements within normal limits will improve April 18, 2017 5379 - Not Progressing by Edoardo Laforte A, RN Note Patient requiring frequent monitoring and Levophed increased from 40 to 58 over nightshift. Patient ABG also acidotic and bicarb given. Difficult to keep blood pressure with MAP above 60-65. Patient's skin mottled and purple with cold extremities.

## 2017-04-15 NOTE — Progress Notes (Signed)
PULMONARY / CRITICAL CARE MEDICINE   Name: Laurie Collins MRN: [id number] DOB: 1940/11/10    ADMISSION DATE:  03/19/2017 CONSULTATION DATE:  04/07/2017  REFERRING MD:  Dr. Stark Jock   CHIEF COMPLAINT:  Hypoxia   HISTORY OF PRESENT ILLNESS:   77 year old female with PMH of COPD, Depression, DM, HTN, and Metastatic Breast Cancer > Completed 12 weeks of Taxol, 3 cycles of Adriamycin and Cytoxan, Oncology on 11/06 noted need for surgery consultation for bilateral breast surgery   [facility] ED from [facility] on [facility] with hypoxia and shortness of breath. WBC 8.5, LA 5.49, CXR with patchy right basilar and left sided airspace opacities. Failed BiPAP requiring intubation. Given Vancomycin and Azactam. PCCM asked to admit.     SUBJECTIVE:  Worsening shock with elevated lactic acid.  VITAL SIGNS: BP (!) 144/88   Pulse (!) 120   Temp (!) 97.4 F (36.3 C) (Oral) Comment: Marybeth, RN notified  Resp (!) 30   Ht 5\' 5"  (1.651 m)   Wt 184 lb 8.4 oz (83.7 kg)   SpO2 100%   BMI 30.71 kg/m   HEMODYNAMICS: CVP:  [9 mmHg-11 mmHg] 9 mmHg  VENTILATOR SETTINGS: Vent Mode: PRVC FiO2 (%):  [80 %-100 %] 80 % Set Rate:  [16 bmp-30 bmp] 30 bmp Vt Set:  [550 mL] 550 mL PEEP:  [8 cmH20-10 cmH20] 10 cmH20 Plateau Pressure:  [30 cmH20-33 cmH20] 32 cmH20  INTAKE / OUTPUT: I/O last 3 completed shifts: In: 9068.2 [I.V.:7018.2; IV SFKCLEXNT:7001] Out: 2522 [Urine:2522]  PHYSICAL EXAMINATION: Gen:      Chronically ill appearing.  HEENT:  EOMI, sclera anicteric Neck:     No masses; no thyromegaly, ETT tube in place Lungs:    Clear to auscultation bilaterally; normal respiratory effort CV:         Regular rate and rhythm; no murmurs Abd:      + bowel sounds; soft, non-tender; no palpable masses, no distension Ext:    Anasarca Skin:      Mottled extremities Neuro: Sedated, unresponsive  LABS:  BMET Recent Labs  Lab 03/30/17 0407 03/30/17 1020 Apr 16, 2017 0349  NA 137 135 136  K 3.3* 3.0* 4.1  CL  110 112* 100*  CO2 16* 13* 12*  BUN 24* 25* 24*  CREATININE 0.81 0.89 0.87  GLUCOSE 80 149* 157*    Electrolytes Recent Labs  Lab 03/30/17 0407 03/30/17 1020 04/16/17 0349  CALCIUM 7.5* 7.0* 6.8*  MG 1.3*  --   --   PHOS 3.8  --   --     CBC Recent Labs  Lab 03/23/2017 0423 03/30/17 0407  WBC 8.5 3.3*  HGB 11.2* 8.6*  HCT 34.9* 27.7*  PLT 213 139*    Coag's No results for input(s): APTT, INR in the last 168 hours.  Sepsis Markers Recent Labs  Lab 03/15/2017 0648  04/02/2017 0834 03/30/17 0407 03/30/17 1020 03/30/17 1531 16-Apr-2017 0349  LATICACIDVEN 4.2*   < > 5.0*  --  5.1* 6.9*  --   PROCALCITON 0.54  --   --  9.68  --   --  19.69   < > = values in this interval not displayed.    ABG Recent Labs  Lab 03/30/17 2109 03/30/17 2337 04-16-2017 0408  PHART 7.207* 7.234* 7.214*  PCO2ART 27.3* 31.6* 29.6*  PO2ART 69.0* 71.9* 77.0*    Liver Enzymes No results for input(s): AST, ALT, ALKPHOS, BILITOT, ALBUMIN in the last 168 hours.  Cardiac Enzymes Recent Labs  Lab  03/17/2017 0648 04/03/2017 1137 04/08/2017 1636  TROPONINI 0.15* 0.14* 0.17*    Glucose Recent Labs  Lab 03/30/17 1626 03/30/17 2023 03/30/17 2352 Apr 20, 2017 0415 20-Apr-2017 0744 April 20, 2017 1210  GLUCAP 149* 234* 188* 157* 185* 163*    Imaging Dg Chest Port 1 View  Result Date: Apr 20, 2017 CLINICAL DATA:  Acute respiratory failure, intubated patient. History of COPD, breast malignancy, remote history of tuberculosis. Former smoker. EXAM: PORTABLE CHEST 1 VIEW COMPARISON:  Portable chest x-ray of March 30, 2017 FINDINGS: The lungs are reasonably well expanded. There are confluent interstitial and alveolar opacities bilaterally which appear stable. The left hemidiaphragm remains partially obscured and the right hemidiaphragm is less well demonstrated today. The cardiac silhouette is enlarged. The pulmonary vascularity is engorged and indistinct. There is calcification in the wall of the aortic arch.  The endotracheal tube tip projects 4.2 cm above the carina. The esophagogastric tube tip in proximal port project below the left hemidiaphragm off the inferior margin of the image. A porta catheter tip projects over the proximal SVC. A left internal jugular venous catheter tip projects at the confluence of the left internal jugular vein and left subclavian vein. IMPRESSION: Bilateral interstitial and alveolar airspace opacities which may reflect pneumonia or edema or combination of both. The appearance of the chest has not significantly changed since yesterday's study. The support tubes are in reasonable position. Electronically Signed   By: David  Martinique M.D.   On: 20-Apr-2017 07:21   STUDIES:  CXR 12/15 > Patchy right basilar and left-sided airspace opacities raise concern for multifocal pneumonia  CULTURES: Blood 12/15 >> Sputum 12/15 >> U/A 12/15 >>  RVP 12/15>>> neg   ANTIBIOTICS: Azactam 12/15  Vancomycin 12/15   SIGNIFICANT EVENTS: 12/15 > Presents to ED   LINES/TUBES: ETT 12/15 >>  L IJ CVL 12/15>>>   DISCUSSION: 77 year old female with metastatic breast cancer presents to ED with CAP and septic shock requiring intubation, pressors   ASSESSMENT / PLAN:  PULMONARY A: Ventilator dependent respiratory failure secondary to Acute Hypoxic Respiratory failure  in setting of HCAP -Patchy right basilar and left sided airspace opacities  H/O COPD  ARDS  P:   Continue vent support Pulmicort/Duoneb/Brovana   CARDIOVASCULAR A:  Septic shock secondary to HCAP  H/O HTN  Elevated troponin secondary to demand ischemia  Troponin 0.13 >  CVP 1-3 P:  Cardiac Monitoring  On levophed, vaso Start neo Bolus 1 lt NS Hold Norvasc and Metoprolol in setting of hypotension  Follow lactic acid. Check echo.   RENAL A:   Severe Lactic Acidosis  LA 5.49 >  P:   Trend BMP Replace electrolytes as indicated  Bicarb drip  GASTROINTESTINAL A:   SUP  P:   NPO  PPI    HEMATOLOGIC A:   Metastatic Breast Cancer  P:  Trend CBC   INFECTIOUS A:   HCAP +/-Viral Source  P:   Trend WBC and Fever Curve  Trend PCT and LA  Follow Culture Data  RVP and Flu negative  Continue Azactam/Vancomycin given penicillin allergy   ENDOCRINE A:   DM    P:   Trend Glucose  SSI   NEUROLOGIC A:   Metabolic Encephalopathy secondary to septic shock  P:   RASS goal: 0/-1 Monitor  PRN Fentanyl and Versed to achieve RASS   FAMILY  - Updates: no family at bedside 12/16. See plan of care note.   Son Marcello Moores [phone] [place] [phone]  - Inter-disciplinary family meet or  Palliative Care meeting due by: 04/05/2017  The patient is critically ill with multiple organ system failure and requires high complexity decision making for assessment and support, frequent evaluation and titration of therapies, advanced monitoring, review of radiographic studies and interpretation of complex data.   Critical Care Time devoted to patient care services, exclusive of separately billable procedures, described in this note is 35 minutes.   Marshell Garfinkel MD [facility] Pager [phone] If no answer or after 3pm call: [phone] 04-08-2017, 12:43 PM

## 2017-04-15 NOTE — Progress Notes (Signed)
Wasted 240 cc morphine gtt in sink with Doctor, general practice.

## 2017-04-15 NOTE — Progress Notes (Signed)
Patient had just passed when I arrived. No family was available. Charge nurse on-site said she would notify the son according to the agreement between the two of them when he las visited. Chaplain provided emotional support.  Quinton Voth-Chaplain

## 2017-04-15 NOTE — Progress Notes (Signed)
Pt extubated per withdrawal order.  RN at bedside. 

## 2017-04-15 NOTE — Progress Notes (Addendum)
No charge note.  Family elects to withdraw care.  Per Bedside RN PMT consult cancelled.  Florentina Jenny, PA-C Palliative Medicine Pager: [phone]

## 2017-04-15 NOTE — Plan of Care (Addendum)
  Interdisciplinary Goals of Care Family Meeting   Date carried out:: 04-16-17  Location of the meeting: Bedside  Member's involved: Physician, Bedside Registered Nurse and Family Member or next of kin  Durable Power of Attorney or acting medical decision maker: Laurie Collins, son    Discussion: We discussed goals of care for Laurie Collins .  I told Laurie Collins that Laurie Collins is doing worse today with increased pressor requirement and lactic acid.  She is in multiorgan failure with very poor prognosis. She has metastatic breast cancer and is not a candidate for surgery or chemo at this point.  I recommended that we transition to comfort care.  Laurie Collins is unable to make the decision right now and feels overwhelmed.  He wants to discuss with his brother. We may need to get palliative care involved.  Addendum: Son called back and stated that he wants to withdraw care and make comfort care. Orders placed  Code status: Limited Code or DNR with short term  Disposition: Continue current acute care  Time spent for the meeting: 20 mins  Landry Lookingbill 04-16-2017, 12:44 PM

## 2017-04-15 NOTE — Progress Notes (Signed)
Pt's son, Marcello Moores, made decision to withdraw care on his mother. He told this nurse to call him when his mother passes. At 14:52 the ETT was pulled and this nurse stayed with pt. At 15:11 pt asystole on monitor and is noted with no breath sounds, heart sounds, and verified by this nurse and April Lewis RN. Dr. Vaughan Browner notified and called son Marcello Moores to notify him of his mother's passing.

## 2017-04-15 NOTE — Progress Notes (Signed)
Palliative Medicine consult noted. Due to high referral volume, there may be a delay seeing this patient. Please call the Palliative Medicine Team office at [phone] if recommendations are needed in the interim.  Thank you for inviting [place] to see this patient.  Marjie Skiff Johnattan Strassman, RN, BSN, [facility] 04/12/17 1:06 PM Cell [phone] 8:00-4:00 Monday-Friday Office [phone]

## 2017-04-15 DEATH — deceased

## 2018-09-20 IMAGING — CT CT L SPINE W/O CM
3 of 4 series · 11 of 33 positions shown, 13 images · IV contrast (ISOVUE)
Comparison: None available.

CLINICAL DATA: Initial evaluation for back pain.

EXAM:
CT LUMBAR SPINE WITHOUT CONTRAST
TECHNIQUE: Multidetector CT imaging of the lumbar spine was performed without
intravenous contrast administration. Multiplanar CT image
reconstructions were also generated.

[Series 8: lumbar · axial · 0.28mm/px · z∈[-377,-170]mm · 5 of 101 slices shown, 7 images]
[im 17/101  soft-tissue]
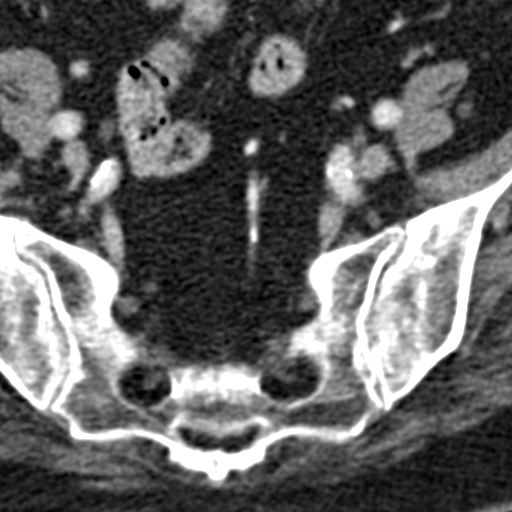
[im 17/101  bone]
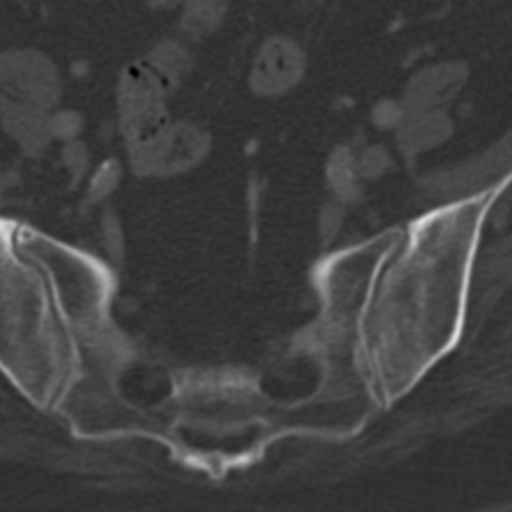
[im 34/101  bone]
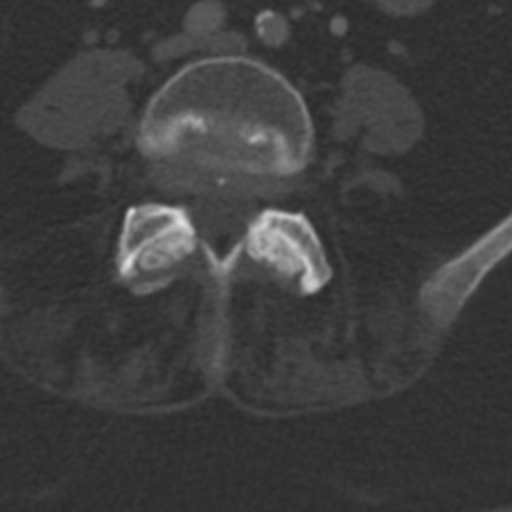
[im 51/101  bone]
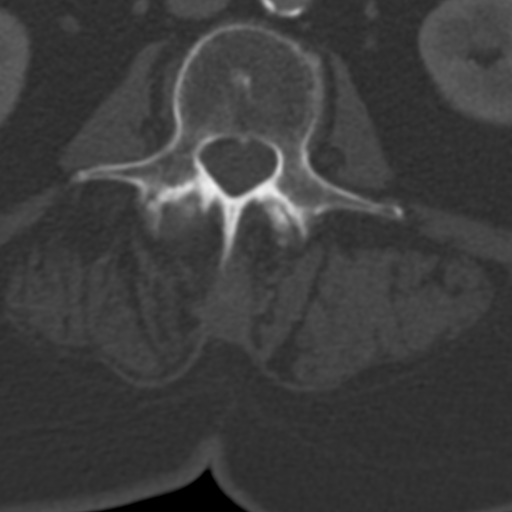
[im 67/101  bone]
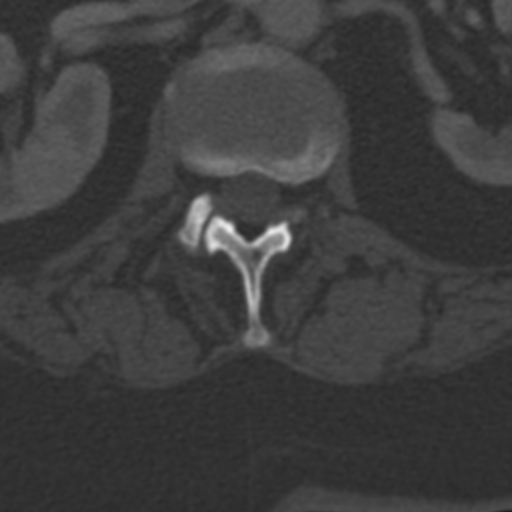
[im 84/101  soft-tissue]
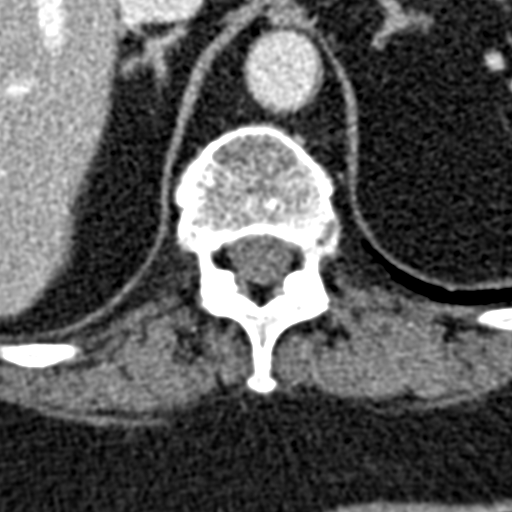
[im 84/101  bone]
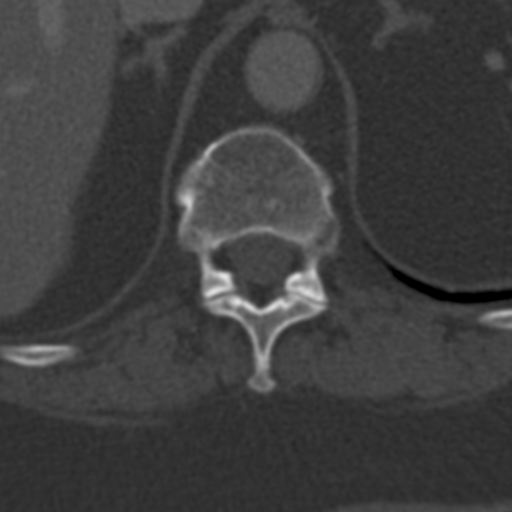

[Series 10: ax lumbar, bone · axial · 0.26mm/px · z∈[-422,-340]mm · 3 of 90 slices shown]
[im 15/90  bone]
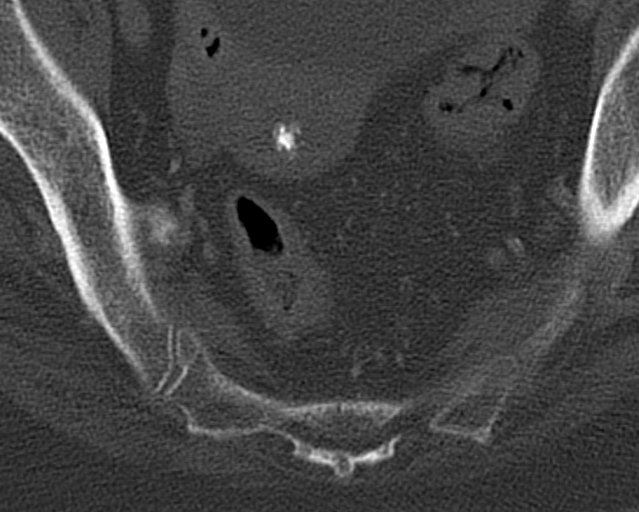
[im 30/90  bone]
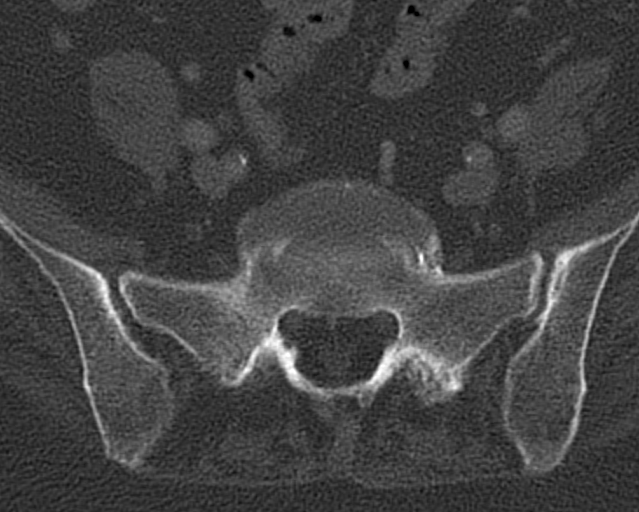
[im 45/90  bone]
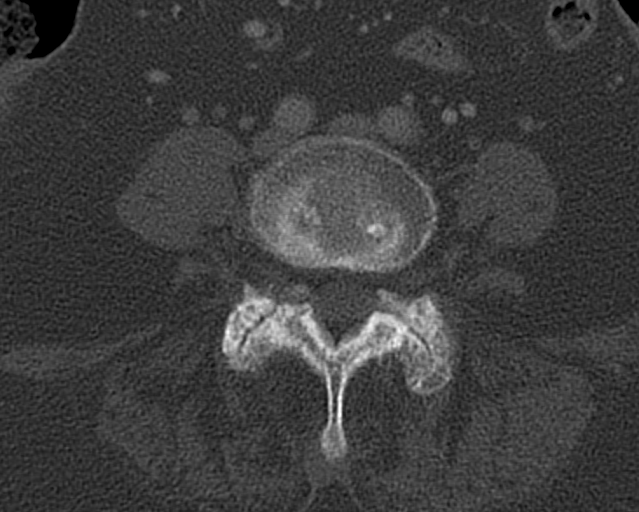

[Series 11: cor lumbar, bone · coronal · 0.26mm/px · 3 of 53 slices shown]
[im 11/53  bone]
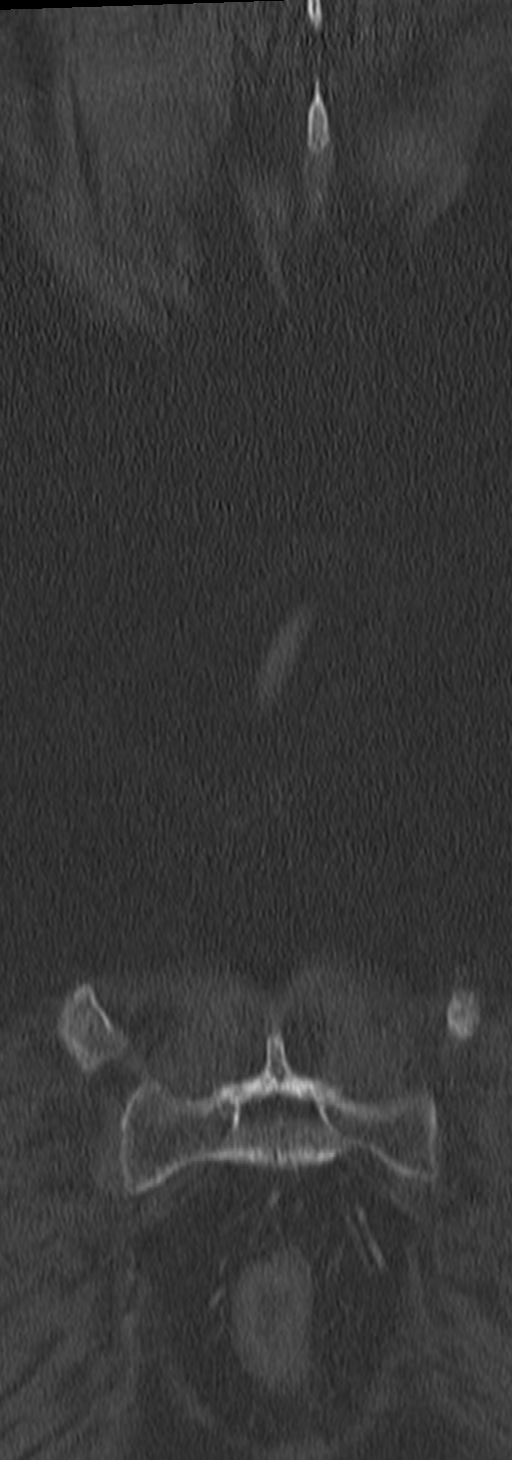
[im 21/53  bone]
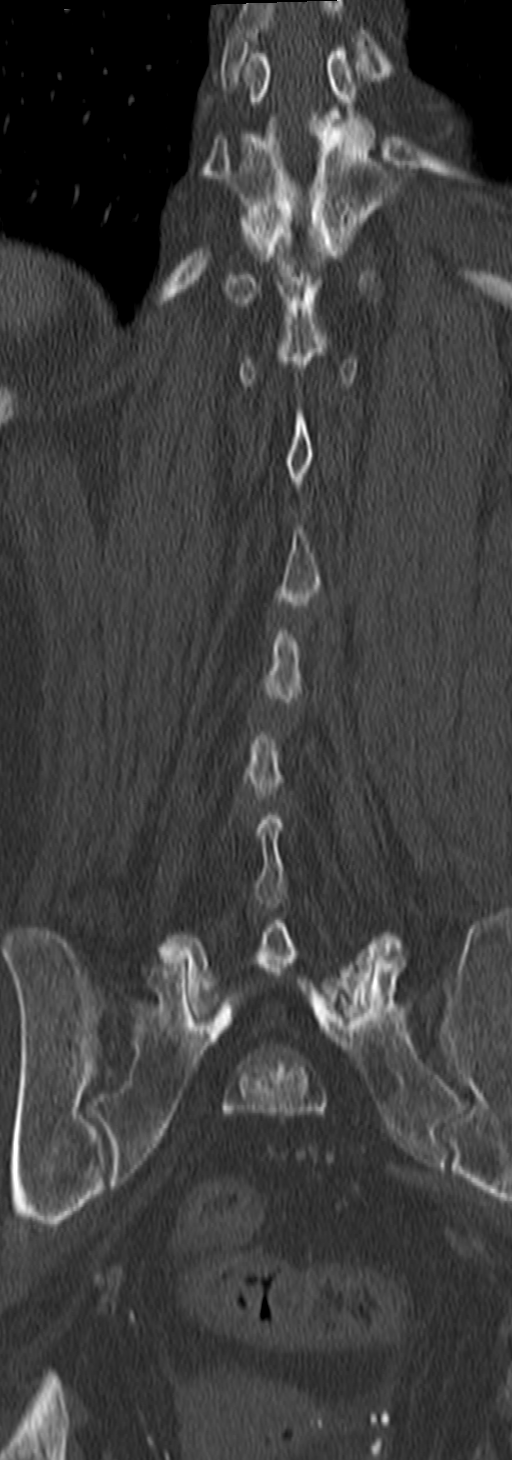
[im 32/53  bone]
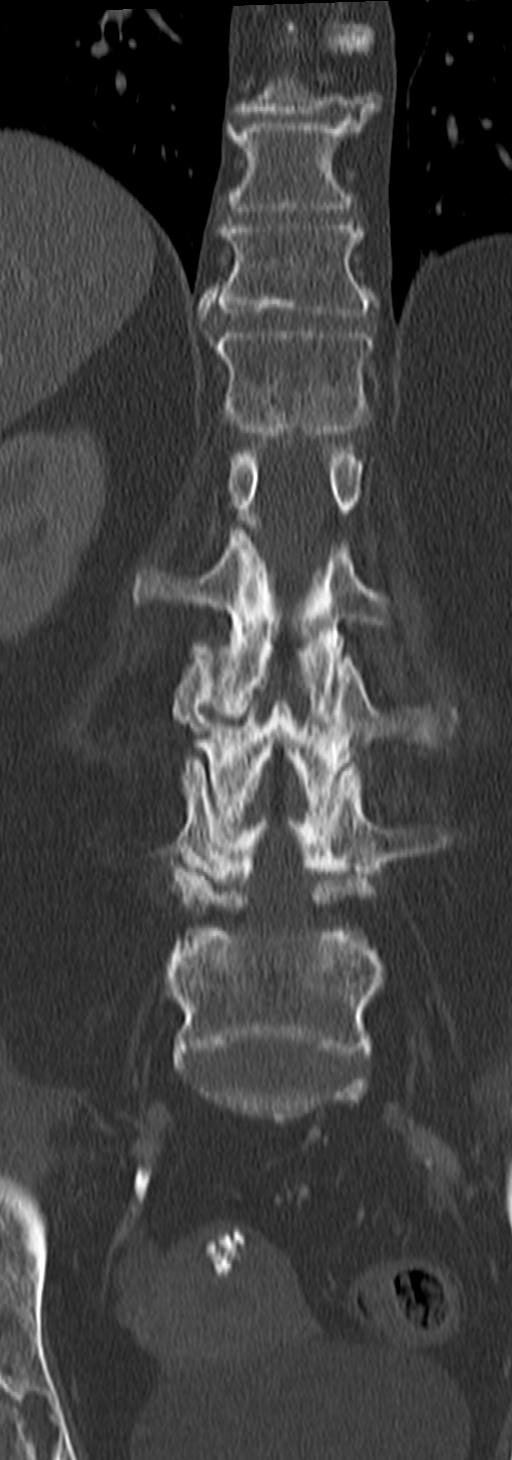

[11 of 33 positions shown; findings below may reference images not displayed]

FINDINGS: Segmentation: Normal segmentation. Lowest well-formed disc labeled
the L5-S1 level.

Alignment: Trace anterolisthesis of L4 on L5 and L5 on S1. Vertebral
bodies otherwise normally aligned with preservation of the normal
lumbar lordosis.

Vertebrae: Vertebral body heights maintained. No evidence for acute
or chronic fracture. No discrete lytic or blastic osseous lesions.
Visualized sacrum and pelvis intact. SI joints fairly symmetric and
within normal limits.

Paraspinal and other soft tissues: Chronic atrophy noted within the
lower paraspinous musculature. Paraspinous soft tissues demonstrate
no acute abnormality. Calcified uterine fibroid noted. Aortic
atherosclerosis. Visualized visceral structures otherwise
unremarkable.

Disc levels:

L1-2: Minimal disc bulge. Mild bilateral facet hypertrophy. No
stenosis.

L2-3: Minimal annular disc bulge. Moderate facet hypertrophy, worse
on the right. No significant canal stenosis. Mild bilateral
foraminal narrowing, slightly greater on the right.

L3-4: Mild diffuse disc bulge. Moderate facet and ligamentum flavum
hypertrophy. No significant canal stenosis. Mild bilateral L3
foraminal narrowing.

L4-5: Trace anterolisthesis. Broad posterior disc bulge. Advanced
facet arthrosis. Resultant mild to moderate canal and subarticular
stenosis. Mild to moderate bilateral foraminal narrowing.

L5-S1: Trace anterolisthesis. No significant disc bulge. Advanced
facet arthrosis. No significant stenosis.
IMPRESSION: 1. No acute abnormality within the lumbar spine.
2. Trace anterolisthesis of L4 on L5 and L5 on S1 with associated
advanced facet arthropathy. Finding could serve as a source for low
back pain.
3. Mild to moderate canal and bilateral foraminal stenosis at L4-5
due to disc bulge and facet disease.
4. Additional more mild multilevel degenerative spondylolysis as
above.
